# Patient Record
Sex: Male | Born: 1975 | Race: Black or African American | Hispanic: No | Marital: Single | Smoking: Never smoker
Health system: Southern US, Community
[De-identification: ages and names within clinical notes are randomized; demographics above are authoritative.]

## PROBLEM LIST (undated history)

## (undated) DIAGNOSIS — I10 Essential (primary) hypertension: Secondary | ICD-10-CM

## (undated) DIAGNOSIS — C9590 Leukemia, unspecified not having achieved remission: Secondary | ICD-10-CM

## (undated) DIAGNOSIS — S0300XA Dislocation of jaw, unspecified side, initial encounter: Secondary | ICD-10-CM

## (undated) DIAGNOSIS — F840 Autistic disorder: Secondary | ICD-10-CM

## (undated) DIAGNOSIS — L739 Follicular disorder, unspecified: Secondary | ICD-10-CM

## (undated) DIAGNOSIS — F639 Impulse disorder, unspecified: Secondary | ICD-10-CM

## (undated) HISTORY — PX: ANKLE SURGERY: SHX546

---

## 1997-10-05 ENCOUNTER — Ambulatory Visit (HOSPITAL_BASED_OUTPATIENT_CLINIC_OR_DEPARTMENT_OTHER): Admission: RE | Admit: 1997-10-05 | Discharge: 1997-10-05 | Payer: Self-pay | Admitting: Otolaryngology

## 1999-06-15 ENCOUNTER — Emergency Department (HOSPITAL_COMMUNITY): Admission: EM | Admit: 1999-06-15 | Discharge: 1999-06-15 | Payer: Self-pay | Admitting: Emergency Medicine

## 1999-06-23 ENCOUNTER — Emergency Department (HOSPITAL_COMMUNITY): Admission: EM | Admit: 1999-06-23 | Discharge: 1999-06-23 | Payer: Self-pay | Admitting: Emergency Medicine

## 1999-06-23 ENCOUNTER — Encounter: Payer: Self-pay | Admitting: Emergency Medicine

## 2000-02-29 ENCOUNTER — Emergency Department (HOSPITAL_COMMUNITY): Admission: EM | Admit: 2000-02-29 | Discharge: 2000-02-29 | Payer: Self-pay | Admitting: Emergency Medicine

## 2000-02-29 ENCOUNTER — Encounter: Payer: Self-pay | Admitting: Emergency Medicine

## 2000-03-02 ENCOUNTER — Ambulatory Visit (HOSPITAL_COMMUNITY): Admission: RE | Admit: 2000-03-02 | Discharge: 2000-03-02 | Payer: Self-pay | Admitting: Gastroenterology

## 2000-03-02 ENCOUNTER — Encounter (INDEPENDENT_AMBULATORY_CARE_PROVIDER_SITE_OTHER): Payer: Self-pay | Admitting: *Deleted

## 2000-07-01 ENCOUNTER — Emergency Department (HOSPITAL_COMMUNITY): Admission: EM | Admit: 2000-07-01 | Discharge: 2000-07-01 | Payer: Self-pay | Admitting: Emergency Medicine

## 2000-07-01 ENCOUNTER — Encounter: Payer: Self-pay | Admitting: Emergency Medicine

## 2001-07-31 ENCOUNTER — Emergency Department (HOSPITAL_COMMUNITY): Admission: EM | Admit: 2001-07-31 | Discharge: 2001-07-31 | Payer: Self-pay | Admitting: Emergency Medicine

## 2001-08-07 ENCOUNTER — Emergency Department (HOSPITAL_COMMUNITY): Admission: EM | Admit: 2001-08-07 | Discharge: 2001-08-07 | Payer: Self-pay | Admitting: Emergency Medicine

## 2002-08-18 ENCOUNTER — Encounter: Payer: Self-pay | Admitting: Emergency Medicine

## 2002-08-18 ENCOUNTER — Emergency Department (HOSPITAL_COMMUNITY): Admission: EM | Admit: 2002-08-18 | Discharge: 2002-08-18 | Payer: Self-pay | Admitting: Emergency Medicine

## 2002-09-08 ENCOUNTER — Emergency Department (HOSPITAL_COMMUNITY): Admission: EM | Admit: 2002-09-08 | Discharge: 2002-09-08 | Payer: Self-pay | Admitting: Emergency Medicine

## 2003-03-03 ENCOUNTER — Emergency Department (HOSPITAL_COMMUNITY): Admission: EM | Admit: 2003-03-03 | Discharge: 2003-03-04 | Payer: Self-pay | Admitting: Emergency Medicine

## 2003-12-04 ENCOUNTER — Ambulatory Visit (HOSPITAL_COMMUNITY): Admission: RE | Admit: 2003-12-04 | Discharge: 2003-12-04 | Payer: Self-pay | Admitting: Family Medicine

## 2004-05-07 ENCOUNTER — Emergency Department (HOSPITAL_COMMUNITY): Admission: EM | Admit: 2004-05-07 | Discharge: 2004-05-07 | Payer: Self-pay | Admitting: Emergency Medicine

## 2004-05-12 ENCOUNTER — Emergency Department (HOSPITAL_COMMUNITY): Admission: EM | Admit: 2004-05-12 | Discharge: 2004-05-12 | Payer: Self-pay | Admitting: Emergency Medicine

## 2005-07-12 ENCOUNTER — Ambulatory Visit: Payer: Self-pay | Admitting: Oncology

## 2005-07-18 ENCOUNTER — Encounter: Payer: Self-pay | Admitting: Oncology

## 2005-07-18 ENCOUNTER — Other Ambulatory Visit: Admission: RE | Admit: 2005-07-18 | Discharge: 2005-07-18 | Payer: Self-pay | Admitting: Oncology

## 2005-07-18 LAB — COMPREHENSIVE METABOLIC PANEL
AST: 40 U/L — ABNORMAL HIGH (ref 0–37)
Albumin: 4.5 g/dL (ref 3.5–5.2)
Alkaline Phosphatase: 73 U/L (ref 39–117)
BUN: 12 mg/dL (ref 6–23)
Creatinine, Ser: 1 mg/dL (ref 0.4–1.5)
Glucose, Bld: 109 mg/dL — ABNORMAL HIGH (ref 70–99)

## 2005-07-18 LAB — MANUAL DIFFERENTIAL
ANC (CHCC manual diff): 119 10*3/uL — ABNORMAL HIGH (ref 1.5–6.5)
Blasts: 12 % — ABNORMAL HIGH (ref 0–0)
EOS: 3 % (ref 0–7)
Metamyelocytes: 15 % — ABNORMAL HIGH (ref 0–0)
Myelocytes: 27 % — ABNORMAL HIGH (ref 0–0)
nRBC: 0 % (ref 0–0)

## 2005-07-18 LAB — CBC WITH DIFFERENTIAL/PLATELET
HCT: 36.9 % — ABNORMAL LOW (ref 38.7–49.9)
MCH: 30.6 pg (ref 28.0–33.4)
MCHC: 33.3 g/dL (ref 32.0–35.9)
RBC: 4.01 10*6/uL — ABNORMAL LOW (ref 4.20–5.71)
RDW: 16.6 % — ABNORMAL HIGH (ref 11.2–14.6)

## 2005-07-18 LAB — CHCC SMEAR

## 2005-07-19 ENCOUNTER — Ambulatory Visit: Payer: Self-pay | Admitting: Oncology

## 2005-07-19 ENCOUNTER — Inpatient Hospital Stay (HOSPITAL_COMMUNITY): Admission: EM | Admit: 2005-07-19 | Discharge: 2005-07-25 | Payer: Self-pay | Admitting: Oncology

## 2005-08-10 LAB — CBC WITH DIFFERENTIAL/PLATELET
Basophils Absolute: 0 10*3/uL (ref 0.0–0.1)
Eosinophils Absolute: 0 10*3/uL (ref 0.0–0.5)
HCT: 35.4 % — ABNORMAL LOW (ref 38.7–49.9)
HGB: 12.2 g/dL — ABNORMAL LOW (ref 13.0–17.1)
MCH: 31.9 pg (ref 28.0–33.4)
MONO#: 0.3 10*3/uL (ref 0.1–0.9)
NEUT#: 3.2 10*3/uL (ref 1.5–6.5)
NEUT%: 71.7 % (ref 40.0–75.0)
RDW: 18 % — ABNORMAL HIGH (ref 11.2–14.6)
WBC: 4.4 10*3/uL (ref 4.0–10.0)
lymph#: 0.9 10*3/uL (ref 0.9–3.3)

## 2005-08-21 LAB — COMPREHENSIVE METABOLIC PANEL
AST: 21 U/L (ref 0–37)
Albumin: 4.9 g/dL (ref 3.5–5.2)
BUN: 10 mg/dL (ref 6–23)
CO2: 29 mEq/L (ref 19–32)
Calcium: 9.4 mg/dL (ref 8.4–10.5)
Chloride: 96 mEq/L (ref 96–112)
Creatinine, Ser: 0.97 mg/dL (ref 0.40–1.50)
Potassium: 3.7 mEq/L (ref 3.5–5.3)

## 2005-08-21 LAB — LACTATE DEHYDROGENASE: LDH: 205 U/L (ref 94–250)

## 2005-08-23 LAB — BCR/ABL GENE REARRANGEMENT QNT, PCR: Major BCR/ABL(b2/a2 or b3/a2): 0.8058 Ratio

## 2005-08-29 ENCOUNTER — Ambulatory Visit: Payer: Self-pay | Admitting: Oncology

## 2005-08-29 LAB — CBC WITH DIFFERENTIAL/PLATELET
Basophils Absolute: 0 10*3/uL (ref 0.0–0.1)
Eosinophils Absolute: 0 10*3/uL (ref 0.0–0.5)
HCT: 37 % — ABNORMAL LOW (ref 38.7–49.9)
HGB: 12.5 g/dL — ABNORMAL LOW (ref 13.0–17.1)
LYMPH%: 24.9 % (ref 14.0–48.0)
MCV: 93.2 fL (ref 81.6–98.0)
MONO#: 0.7 10*3/uL (ref 0.1–0.9)
MONO%: 16.5 % — ABNORMAL HIGH (ref 0.0–13.0)
NEUT#: 2.4 10*3/uL (ref 1.5–6.5)
Platelets: 167 10*3/uL (ref 145–400)
RBC: 3.97 10*6/uL — ABNORMAL LOW (ref 4.20–5.71)
WBC: 4.1 10*3/uL (ref 4.0–10.0)

## 2005-09-08 LAB — CBC WITH DIFFERENTIAL/PLATELET
BASO%: 0.2 % (ref 0.0–2.0)
Eosinophils Absolute: 0 10*3/uL (ref 0.0–0.5)
HCT: 38.2 % — ABNORMAL LOW (ref 38.7–49.9)
LYMPH%: 23.5 % (ref 14.0–48.0)
MCHC: 33.5 g/dL (ref 32.0–35.9)
MCV: 93.2 fL (ref 81.6–98.0)
MONO#: 0.6 10*3/uL (ref 0.1–0.9)
MONO%: 13.1 % — ABNORMAL HIGH (ref 0.0–13.0)
NEUT%: 62.7 % (ref 40.0–75.0)
Platelets: 174 10*3/uL (ref 145–400)
RBC: 4.1 10*6/uL — ABNORMAL LOW (ref 4.20–5.71)
WBC: 4.4 10*3/uL (ref 4.0–10.0)

## 2005-09-08 LAB — LACTATE DEHYDROGENASE: LDH: 194 U/L (ref 94–250)

## 2005-09-08 LAB — COMPREHENSIVE METABOLIC PANEL
Alkaline Phosphatase: 522 U/L — ABNORMAL HIGH (ref 39–117)
CO2: 25 mEq/L (ref 19–32)
Creatinine, Ser: 1.01 mg/dL (ref 0.40–1.50)
Glucose, Bld: 81 mg/dL (ref 70–99)
Sodium: 130 mEq/L — ABNORMAL LOW (ref 135–145)
Total Bilirubin: 0.4 mg/dL (ref 0.3–1.2)

## 2005-10-12 ENCOUNTER — Ambulatory Visit: Payer: Self-pay | Admitting: Oncology

## 2005-10-12 LAB — CBC WITH DIFFERENTIAL/PLATELET
Basophils Absolute: 0 10*3/uL (ref 0.0–0.1)
Eosinophils Absolute: 0.1 10*3/uL (ref 0.0–0.5)
HGB: 11.9 g/dL — ABNORMAL LOW (ref 13.0–17.1)
LYMPH%: 25.8 % (ref 14.0–48.0)
MONO#: 0.5 10*3/uL (ref 0.1–0.9)
NEUT#: 2.6 10*3/uL (ref 1.5–6.5)
Platelets: 169 10*3/uL (ref 145–400)
RBC: 3.84 10*6/uL — ABNORMAL LOW (ref 4.20–5.71)
WBC: 4.4 10*3/uL (ref 4.0–10.0)

## 2005-10-17 LAB — COMPREHENSIVE METABOLIC PANEL
Albumin: 4 g/dL (ref 3.5–5.2)
CO2: 29 mEq/L (ref 19–32)
Glucose, Bld: 90 mg/dL (ref 70–99)
Potassium: 3.7 mEq/L (ref 3.5–5.3)
Sodium: 131 mEq/L — ABNORMAL LOW (ref 135–145)
Total Bilirubin: 0.4 mg/dL (ref 0.3–1.2)
Total Protein: 6.5 g/dL (ref 6.0–8.3)

## 2005-10-17 LAB — LACTATE DEHYDROGENASE: LDH: 185 U/L (ref 94–250)

## 2005-10-17 LAB — BCR/ABL QUALITATIVE: BCR/ABL t(9,22): POSITIVE — AB

## 2005-10-17 LAB — BCR/ABL GENE REARRANGEMENT QNT, PCR

## 2005-11-15 LAB — COMPREHENSIVE METABOLIC PANEL
Alkaline Phosphatase: 135 U/L — ABNORMAL HIGH (ref 39–117)
BUN: 10 mg/dL (ref 6–23)
CO2: 27 mEq/L (ref 19–32)
Creatinine, Ser: 0.98 mg/dL (ref 0.40–1.50)
Glucose, Bld: 89 mg/dL (ref 70–99)
Sodium: 133 mEq/L — ABNORMAL LOW (ref 135–145)
Total Bilirubin: 0.6 mg/dL (ref 0.3–1.2)
Total Protein: 6.4 g/dL (ref 6.0–8.3)

## 2005-11-15 LAB — CBC WITH DIFFERENTIAL/PLATELET
Basophils Absolute: 0 10*3/uL (ref 0.0–0.1)
EOS%: 1.7 % (ref 0.0–7.0)
HGB: 12.4 g/dL — ABNORMAL LOW (ref 13.0–17.1)
MCH: 30.7 pg (ref 28.0–33.4)
MCHC: 33.8 g/dL (ref 32.0–35.9)
RDW: 16.5 % — ABNORMAL HIGH (ref 11.2–14.6)
WBC: 3.7 10*3/uL — ABNORMAL LOW (ref 4.0–10.0)
lymph#: 0.9 10*3/uL (ref 0.9–3.3)

## 2005-11-19 ENCOUNTER — Inpatient Hospital Stay (HOSPITAL_COMMUNITY): Admission: EM | Admit: 2005-11-19 | Discharge: 2005-11-21 | Payer: Self-pay | Admitting: Emergency Medicine

## 2005-11-23 ENCOUNTER — Emergency Department (HOSPITAL_COMMUNITY): Admission: EM | Admit: 2005-11-23 | Discharge: 2005-11-23 | Payer: Self-pay | Admitting: Emergency Medicine

## 2005-12-13 ENCOUNTER — Ambulatory Visit: Payer: Self-pay | Admitting: Oncology

## 2005-12-18 ENCOUNTER — Encounter: Admission: RE | Admit: 2005-12-18 | Discharge: 2005-12-18 | Payer: Self-pay | Admitting: Orthopedic Surgery

## 2006-01-25 LAB — CBC WITH DIFFERENTIAL/PLATELET
Basophils Absolute: 0 10*3/uL (ref 0.0–0.1)
HCT: 40.3 % (ref 38.7–49.9)
HGB: 13.7 g/dL (ref 13.0–17.1)
LYMPH%: 31.2 % (ref 14.0–48.0)
MCH: 31.3 pg (ref 28.0–33.4)
MONO#: 0.3 10*3/uL (ref 0.1–0.9)
NEUT%: 59.3 % (ref 40.0–75.0)
Platelets: 165 10*3/uL (ref 145–400)
WBC: 3.3 10*3/uL — ABNORMAL LOW (ref 4.0–10.0)
lymph#: 1 10*3/uL (ref 0.9–3.3)

## 2006-01-25 LAB — COMPREHENSIVE METABOLIC PANEL
BUN: 10 mg/dL (ref 6–23)
CO2: 29 mEq/L (ref 19–32)
Calcium: 9.5 mg/dL (ref 8.4–10.5)
Chloride: 92 mEq/L — ABNORMAL LOW (ref 96–112)
Creatinine, Ser: 0.96 mg/dL (ref 0.40–1.50)

## 2006-02-24 ENCOUNTER — Ambulatory Visit: Payer: Self-pay | Admitting: Oncology

## 2006-03-01 LAB — CBC WITH DIFFERENTIAL/PLATELET
BASO%: 0.3 % (ref 0.0–2.0)
EOS%: 3.1 % (ref 0.0–7.0)
LYMPH%: 33.7 % (ref 14.0–48.0)
MCHC: 33.9 g/dL (ref 32.0–35.9)
MCV: 92 fL (ref 81.6–98.0)
MONO%: 8.1 % (ref 0.0–13.0)
Platelets: 177 10*3/uL (ref 145–400)
RBC: 4.23 10*6/uL (ref 4.20–5.71)
RDW: 14.8 % — ABNORMAL HIGH (ref 11.2–14.6)
WBC: 3.6 10*3/uL — ABNORMAL LOW (ref 4.0–10.0)

## 2006-03-12 LAB — COMPREHENSIVE METABOLIC PANEL
ALT: 39 U/L (ref 0–53)
AST: 35 U/L (ref 0–37)
Alkaline Phosphatase: 191 U/L — ABNORMAL HIGH (ref 39–117)
Potassium: 3.7 mEq/L (ref 3.5–5.3)
Sodium: 139 mEq/L (ref 135–145)
Total Bilirubin: 0.4 mg/dL (ref 0.3–1.2)
Total Protein: 7.3 g/dL (ref 6.0–8.3)

## 2006-03-12 LAB — BCR/ABL GENE REARRANGEMENT QNT, PCR
BCR/ABL Previous Quant 1: POSITIVE — AB
BCR/ABL Previous Quant Ratio: 0.1015

## 2006-04-04 LAB — CBC WITH DIFFERENTIAL/PLATELET
BASO%: 0.3 % (ref 0.0–2.0)
LYMPH%: 38.3 % (ref 14.0–48.0)
MCHC: 34.7 g/dL (ref 32.0–35.9)
MONO#: 0.3 10*3/uL (ref 0.1–0.9)
Platelets: 145 10*3/uL (ref 145–400)
RBC: 4.03 10*6/uL — ABNORMAL LOW (ref 4.20–5.71)
RDW: 16.1 % — ABNORMAL HIGH (ref 11.2–14.6)
WBC: 3.8 10*3/uL — ABNORMAL LOW (ref 4.0–10.0)

## 2006-04-04 LAB — COMPREHENSIVE METABOLIC PANEL
ALT: 33 U/L (ref 0–53)
CO2: 25 mEq/L (ref 19–32)
Calcium: 9.1 mg/dL (ref 8.4–10.5)
Chloride: 99 mEq/L (ref 96–112)
Potassium: 3.7 mEq/L (ref 3.5–5.3)
Sodium: 138 mEq/L (ref 135–145)
Total Protein: 6.9 g/dL (ref 6.0–8.3)

## 2006-04-30 ENCOUNTER — Ambulatory Visit: Payer: Self-pay | Admitting: Oncology

## 2006-05-03 LAB — CBC WITH DIFFERENTIAL/PLATELET
BASO%: 0.2 % (ref 0.0–2.0)
EOS%: 1.1 % (ref 0.0–7.0)
LYMPH%: 33.9 % (ref 14.0–48.0)
MCH: 31.9 pg (ref 28.0–33.4)
MCHC: 35.1 g/dL (ref 32.0–35.9)
MONO#: 0.3 10*3/uL (ref 0.1–0.9)
RBC: 3.81 10*6/uL — ABNORMAL LOW (ref 4.20–5.71)
WBC: 2.9 10*3/uL — ABNORMAL LOW (ref 4.0–10.0)
lymph#: 1 10*3/uL (ref 0.9–3.3)

## 2006-05-03 LAB — COMPREHENSIVE METABOLIC PANEL
ALT: 42 U/L (ref 0–53)
AST: 40 U/L — ABNORMAL HIGH (ref 0–37)
CO2: 26 mEq/L (ref 19–32)
Chloride: 98 mEq/L (ref 96–112)
Sodium: 135 mEq/L (ref 135–145)
Total Bilirubin: 0.5 mg/dL (ref 0.3–1.2)
Total Protein: 7.2 g/dL (ref 6.0–8.3)

## 2006-05-03 LAB — LACTATE DEHYDROGENASE: LDH: 180 U/L (ref 94–250)

## 2006-06-07 LAB — CBC WITH DIFFERENTIAL/PLATELET
BASO%: 0.4 % (ref 0.0–2.0)
Basophils Absolute: 0 10*3/uL (ref 0.0–0.1)
EOS%: 1.6 % (ref 0.0–7.0)
HCT: 36.8 % — ABNORMAL LOW (ref 38.7–49.9)
HGB: 12.8 g/dL — ABNORMAL LOW (ref 13.0–17.1)
LYMPH%: 40.4 % (ref 14.0–48.0)
MCH: 33 pg (ref 28.0–33.4)
MCHC: 34.9 g/dL (ref 32.0–35.9)
MCV: 94.5 fL (ref 81.6–98.0)
MONO%: 12.3 % (ref 0.0–13.0)
NEUT%: 45.3 % (ref 40.0–75.0)
lymph#: 1.2 10*3/uL (ref 0.9–3.3)

## 2006-06-12 LAB — BCR/ABL GENE REARRANGEMENT QNT, PCR: BCR/ABL Previous Quant Ratio: 0.0346

## 2006-06-12 LAB — COMPREHENSIVE METABOLIC PANEL
ALT: 35 U/L (ref 0–53)
CO2: 27 mEq/L (ref 19–32)
Calcium: 9.3 mg/dL (ref 8.4–10.5)
Chloride: 98 mEq/L (ref 96–112)
Sodium: 133 mEq/L — ABNORMAL LOW (ref 135–145)
Total Protein: 6.7 g/dL (ref 6.0–8.3)

## 2006-06-12 LAB — LACTATE DEHYDROGENASE: LDH: 170 U/L (ref 94–250)

## 2006-07-04 ENCOUNTER — Ambulatory Visit: Payer: Self-pay | Admitting: Oncology

## 2006-07-06 LAB — COMPREHENSIVE METABOLIC PANEL
ALT: 32 U/L (ref 0–53)
Albumin: 4.3 g/dL (ref 3.5–5.2)
BUN: 10 mg/dL (ref 6–23)
CO2: 28 mEq/L (ref 19–32)
Calcium: 9.3 mg/dL (ref 8.4–10.5)
Chloride: 98 mEq/L (ref 96–112)
Creatinine, Ser: 1 mg/dL (ref 0.40–1.50)

## 2006-07-06 LAB — CBC WITH DIFFERENTIAL/PLATELET
BASO%: 0.1 % (ref 0.0–2.0)
Basophils Absolute: 0 10*3/uL (ref 0.0–0.1)
HCT: 36.3 % — ABNORMAL LOW (ref 38.7–49.9)
HGB: 12.6 g/dL — ABNORMAL LOW (ref 13.0–17.1)
MONO#: 0.5 10*3/uL (ref 0.1–0.9)
NEUT%: 32.8 % — ABNORMAL LOW (ref 40.0–75.0)
WBC: 3.7 10*3/uL — ABNORMAL LOW (ref 4.0–10.0)
lymph#: 1.9 10*3/uL (ref 0.9–3.3)

## 2006-07-06 LAB — LACTATE DEHYDROGENASE: LDH: 183 U/L (ref 94–250)

## 2006-08-10 LAB — CBC WITH DIFFERENTIAL/PLATELET
Basophils Absolute: 0 10*3/uL (ref 0.0–0.1)
EOS%: 1.4 % (ref 0.0–7.0)
HCT: 35.7 % — ABNORMAL LOW (ref 38.7–49.9)
HGB: 12.7 g/dL — ABNORMAL LOW (ref 13.0–17.1)
MCH: 33.1 pg (ref 28.0–33.4)
MCV: 93.3 fL (ref 81.6–98.0)
MONO%: 11.4 % (ref 0.0–13.0)
NEUT%: 45.8 % (ref 40.0–75.0)

## 2006-08-10 LAB — COMPREHENSIVE METABOLIC PANEL
AST: 43 U/L — ABNORMAL HIGH (ref 0–37)
Alkaline Phosphatase: 151 U/L — ABNORMAL HIGH (ref 39–117)
BUN: 13 mg/dL (ref 6–23)
Calcium: 9.1 mg/dL (ref 8.4–10.5)
Chloride: 99 mEq/L (ref 96–112)
Creatinine, Ser: 1.09 mg/dL (ref 0.40–1.50)

## 2006-09-26 ENCOUNTER — Ambulatory Visit: Payer: Self-pay | Admitting: Oncology

## 2006-09-28 LAB — COMPREHENSIVE METABOLIC PANEL
Albumin: 4 g/dL (ref 3.5–5.2)
Alkaline Phosphatase: 131 U/L — ABNORMAL HIGH (ref 39–117)
BUN: 11 mg/dL (ref 6–23)
CO2: 26 mEq/L (ref 19–32)
Calcium: 8.8 mg/dL (ref 8.4–10.5)
Chloride: 99 mEq/L (ref 96–112)
Glucose, Bld: 69 mg/dL — ABNORMAL LOW (ref 70–99)
Potassium: 4 mEq/L (ref 3.5–5.3)

## 2006-09-28 LAB — CBC WITH DIFFERENTIAL/PLATELET
Basophils Absolute: 0 10*3/uL (ref 0.0–0.1)
Eosinophils Absolute: 0 10*3/uL (ref 0.0–0.5)
HGB: 11.9 g/dL — ABNORMAL LOW (ref 13.0–17.1)
MCV: 94 fL (ref 81.6–98.0)
MONO#: 0.3 10*3/uL (ref 0.1–0.9)
MONO%: 10.6 % (ref 0.0–13.0)
NEUT#: 1.3 10*3/uL — ABNORMAL LOW (ref 1.5–6.5)
RDW: 14.8 % — ABNORMAL HIGH (ref 11.2–14.6)

## 2006-11-15 ENCOUNTER — Ambulatory Visit: Payer: Self-pay | Admitting: Oncology

## 2006-11-20 LAB — COMPREHENSIVE METABOLIC PANEL
ALT: 34 U/L (ref 0–53)
CO2: 28 mEq/L (ref 19–32)
Calcium: 9.2 mg/dL (ref 8.4–10.5)
Chloride: 102 mEq/L (ref 96–112)
Sodium: 138 mEq/L (ref 135–145)
Total Protein: 6.7 g/dL (ref 6.0–8.3)

## 2006-11-20 LAB — CBC WITH DIFFERENTIAL/PLATELET
BASO%: 1.4 % (ref 0.0–2.0)
MCHC: 34.7 g/dL (ref 32.0–35.9)
MONO#: 0.4 10*3/uL (ref 0.1–0.9)
RBC: 3.85 10*6/uL — ABNORMAL LOW (ref 4.20–5.71)
RDW: 12.2 % (ref 11.2–14.6)
WBC: 2.9 10*3/uL — ABNORMAL LOW (ref 4.0–10.0)
lymph#: 1.3 10*3/uL (ref 0.9–3.3)

## 2006-11-20 LAB — LACTATE DEHYDROGENASE: LDH: 172 U/L (ref 94–250)

## 2006-12-17 ENCOUNTER — Emergency Department (HOSPITAL_COMMUNITY): Admission: EM | Admit: 2006-12-17 | Discharge: 2006-12-17 | Payer: Self-pay | Admitting: *Deleted

## 2007-01-15 ENCOUNTER — Ambulatory Visit: Payer: Self-pay | Admitting: Oncology

## 2007-01-17 LAB — CBC WITH DIFFERENTIAL/PLATELET
Basophils Absolute: 0 10*3/uL (ref 0.0–0.1)
EOS%: 3.3 % (ref 0.0–7.0)
HGB: 11.5 g/dL — ABNORMAL LOW (ref 13.0–17.1)
LYMPH%: 38.8 % (ref 14.0–48.0)
MCH: 33.2 pg (ref 28.0–33.4)
MCV: 94.8 fL (ref 81.6–98.0)
MONO%: 7.9 % (ref 0.0–13.0)
Platelets: 149 10*3/uL (ref 145–400)
RBC: 3.45 10*6/uL — ABNORMAL LOW (ref 4.20–5.71)
RDW: 14.3 % (ref 11.2–14.6)

## 2007-01-17 LAB — COMPREHENSIVE METABOLIC PANEL
AST: 58 U/L — ABNORMAL HIGH (ref 0–37)
Albumin: 4.1 g/dL (ref 3.5–5.2)
Alkaline Phosphatase: 90 U/L (ref 39–117)
BUN: 18 mg/dL (ref 6–23)
Potassium: 3.9 mEq/L (ref 3.5–5.3)
Total Bilirubin: 0.4 mg/dL (ref 0.3–1.2)

## 2007-01-17 LAB — LACTATE DEHYDROGENASE: LDH: 188 U/L (ref 94–250)

## 2007-03-18 ENCOUNTER — Ambulatory Visit: Payer: Self-pay | Admitting: Oncology

## 2007-03-20 LAB — COMPREHENSIVE METABOLIC PANEL
ALT: 32 U/L (ref 0–53)
AST: 39 U/L — ABNORMAL HIGH (ref 0–37)
Calcium: 8.9 mg/dL (ref 8.4–10.5)
Chloride: 100 mEq/L (ref 96–112)
Creatinine, Ser: 1.15 mg/dL (ref 0.40–1.50)
Sodium: 136 mEq/L (ref 135–145)
Total Protein: 6.8 g/dL (ref 6.0–8.3)

## 2007-03-20 LAB — CBC WITH DIFFERENTIAL/PLATELET
BASO%: 0.1 % (ref 0.0–2.0)
EOS%: 1.2 % (ref 0.0–7.0)
MCH: 32.7 pg (ref 28.0–33.4)
MCHC: 34.1 g/dL (ref 32.0–35.9)
NEUT%: 53.5 % (ref 40.0–75.0)
RBC: 3.72 10*6/uL — ABNORMAL LOW (ref 4.20–5.71)
RDW: 14.2 % (ref 11.2–14.6)
WBC: 3.2 10*3/uL — ABNORMAL LOW (ref 4.0–10.0)
lymph#: 1.2 10*3/uL (ref 0.9–3.3)

## 2007-05-13 ENCOUNTER — Ambulatory Visit: Payer: Self-pay | Admitting: Oncology

## 2007-05-15 LAB — COMPREHENSIVE METABOLIC PANEL
AST: 42 U/L — ABNORMAL HIGH (ref 0–37)
Alkaline Phosphatase: 87 U/L (ref 39–117)
Glucose, Bld: 88 mg/dL (ref 70–99)
Potassium: 3.9 mEq/L (ref 3.5–5.3)
Sodium: 137 mEq/L (ref 135–145)
Total Bilirubin: 0.4 mg/dL (ref 0.3–1.2)
Total Protein: 6.8 g/dL (ref 6.0–8.3)

## 2007-05-15 LAB — CBC WITH DIFFERENTIAL/PLATELET
BASO%: 0.1 % (ref 0.0–2.0)
EOS%: 4 % (ref 0.0–7.0)
MCH: 33 pg (ref 28.0–33.4)
MCHC: 34.8 g/dL (ref 32.0–35.9)
MONO#: 0.3 10*3/uL (ref 0.1–0.9)
NEUT%: 46.4 % (ref 40.0–75.0)
RBC: 3.6 10*6/uL — ABNORMAL LOW (ref 4.20–5.71)
RDW: 14.2 % (ref 11.2–14.6)
WBC: 3.2 10*3/uL — ABNORMAL LOW (ref 4.0–10.0)
lymph#: 1.3 10*3/uL (ref 0.9–3.3)

## 2007-05-22 LAB — BCR/ABL

## 2007-07-12 ENCOUNTER — Ambulatory Visit: Payer: Self-pay | Admitting: Oncology

## 2007-07-19 ENCOUNTER — Emergency Department (HOSPITAL_COMMUNITY): Admission: EM | Admit: 2007-07-19 | Discharge: 2007-07-19 | Payer: Self-pay | Admitting: Emergency Medicine

## 2007-09-10 ENCOUNTER — Encounter: Admission: RE | Admit: 2007-09-10 | Discharge: 2007-09-10 | Payer: Self-pay | Admitting: Family Medicine

## 2007-10-13 ENCOUNTER — Ambulatory Visit: Payer: Self-pay | Admitting: Oncology

## 2007-10-17 LAB — CBC WITH DIFFERENTIAL/PLATELET
BASO%: 0.4 % (ref 0.0–2.0)
Eosinophils Absolute: 0.1 10*3/uL (ref 0.0–0.5)
HCT: 34.6 % — ABNORMAL LOW (ref 38.7–49.9)
LYMPH%: 39.3 % (ref 14.0–48.0)
MCHC: 34.1 g/dL (ref 32.0–35.9)
MONO#: 0.3 10*3/uL (ref 0.1–0.9)
NEUT#: 1.4 10*3/uL — ABNORMAL LOW (ref 1.5–6.5)
Platelets: 116 10*3/uL — ABNORMAL LOW (ref 145–400)
RBC: 3.54 10*6/uL — ABNORMAL LOW (ref 4.20–5.71)
WBC: 3 10*3/uL — ABNORMAL LOW (ref 4.0–10.0)
lymph#: 1.2 10*3/uL (ref 0.9–3.3)

## 2007-10-17 LAB — COMPREHENSIVE METABOLIC PANEL
ALT: 42 U/L (ref 0–53)
Albumin: 4 g/dL (ref 3.5–5.2)
CO2: 26 mEq/L (ref 19–32)
Calcium: 9 mg/dL (ref 8.4–10.5)
Chloride: 94 mEq/L — ABNORMAL LOW (ref 96–112)
Glucose, Bld: 111 mg/dL — ABNORMAL HIGH (ref 70–99)
Potassium: 4 mEq/L (ref 3.5–5.3)
Sodium: 131 mEq/L — ABNORMAL LOW (ref 135–145)
Total Bilirubin: 0.5 mg/dL (ref 0.3–1.2)
Total Protein: 6.7 g/dL (ref 6.0–8.3)

## 2007-10-17 LAB — LACTATE DEHYDROGENASE: LDH: 218 U/L (ref 94–250)

## 2007-11-28 ENCOUNTER — Ambulatory Visit: Payer: Self-pay | Admitting: Oncology

## 2007-11-28 LAB — CBC WITH DIFFERENTIAL/PLATELET
Basophils Absolute: 0 10*3/uL (ref 0.0–0.1)
EOS%: 3 % (ref 0.0–7.0)
HGB: 11.1 g/dL — ABNORMAL LOW (ref 13.0–17.1)
LYMPH%: 34.1 % (ref 14.0–48.0)
MCH: 32.6 pg (ref 28.0–33.4)
MCV: 96.7 fL (ref 81.6–98.0)
MONO%: 11.5 % (ref 0.0–13.0)
NEUT%: 50.2 % (ref 40.0–75.0)
Platelets: 130 10*3/uL — ABNORMAL LOW (ref 145–400)
RDW: 13.4 % (ref 11.2–14.6)

## 2007-11-28 LAB — COMPREHENSIVE METABOLIC PANEL
AST: 46 U/L — ABNORMAL HIGH (ref 0–37)
Alkaline Phosphatase: 80 U/L (ref 39–117)
BUN: 18 mg/dL (ref 6–23)
Creatinine, Ser: 1.35 mg/dL (ref 0.40–1.50)
Potassium: 4.1 mEq/L (ref 3.5–5.3)
Total Bilirubin: 0.5 mg/dL (ref 0.3–1.2)

## 2007-12-04 LAB — BCR/ABL

## 2008-02-03 ENCOUNTER — Ambulatory Visit: Payer: Self-pay | Admitting: Oncology

## 2008-02-05 LAB — CBC WITH DIFFERENTIAL/PLATELET
Eosinophils Absolute: 0.1 10*3/uL (ref 0.0–0.5)
HCT: 31.7 % — ABNORMAL LOW (ref 38.7–49.9)
HGB: 11 g/dL — ABNORMAL LOW (ref 13.0–17.1)
LYMPH%: 36.9 % (ref 14.0–48.0)
MONO#: 0.4 10*3/uL (ref 0.1–0.9)
NEUT#: 1.6 10*3/uL (ref 1.5–6.5)
NEUT%: 47.5 % (ref 40.0–75.0)
Platelets: 140 10*3/uL — ABNORMAL LOW (ref 145–400)
WBC: 3.3 10*3/uL — ABNORMAL LOW (ref 4.0–10.0)

## 2008-02-05 LAB — COMPREHENSIVE METABOLIC PANEL
ALT: 47 U/L (ref 0–53)
AST: 53 U/L — ABNORMAL HIGH (ref 0–37)
CO2: 26 mEq/L (ref 19–32)
Chloride: 98 mEq/L (ref 96–112)
Sodium: 136 mEq/L (ref 135–145)
Total Bilirubin: 0.6 mg/dL (ref 0.3–1.2)
Total Protein: 7.4 g/dL (ref 6.0–8.3)

## 2008-02-05 LAB — LACTATE DEHYDROGENASE: LDH: 276 U/L — ABNORMAL HIGH (ref 94–250)

## 2008-03-06 ENCOUNTER — Encounter: Payer: Self-pay | Admitting: Gastroenterology

## 2008-03-10 LAB — URINALYSIS, MICROSCOPIC - CHCC
Bilirubin (Urine): NEGATIVE
Blood: NEGATIVE
Ketones: NEGATIVE mg/dL
pH: 7 (ref 4.6–8.0)

## 2008-05-05 ENCOUNTER — Ambulatory Visit: Payer: Self-pay | Admitting: Oncology

## 2008-05-12 LAB — CBC WITH DIFFERENTIAL/PLATELET
BASO%: 0.2 % (ref 0.0–2.0)
EOS%: 2.1 % (ref 0.0–7.0)
HCT: 36.8 % — ABNORMAL LOW (ref 38.4–49.9)
MCH: 32.5 pg (ref 27.2–33.4)
MCHC: 34.1 g/dL (ref 32.0–36.0)
NEUT%: 49.5 % (ref 39.0–75.0)
RBC: 3.86 10*6/uL — ABNORMAL LOW (ref 4.20–5.82)
lymph#: 1.3 10*3/uL (ref 0.9–3.3)

## 2008-05-12 LAB — COMPREHENSIVE METABOLIC PANEL
ALT: 48 U/L (ref 0–53)
AST: 53 U/L — ABNORMAL HIGH (ref 0–37)
Calcium: 9.4 mg/dL (ref 8.4–10.5)
Chloride: 104 mEq/L (ref 96–112)
Creatinine, Ser: 1.09 mg/dL (ref 0.40–1.50)
Total Bilirubin: 0.5 mg/dL (ref 0.3–1.2)

## 2008-05-19 ENCOUNTER — Encounter: Admission: RE | Admit: 2008-05-19 | Discharge: 2008-05-19 | Payer: Self-pay | Admitting: Family Medicine

## 2008-06-18 ENCOUNTER — Ambulatory Visit: Payer: Self-pay | Admitting: Gastroenterology

## 2008-06-18 DIAGNOSIS — K922 Gastrointestinal hemorrhage, unspecified: Secondary | ICD-10-CM | POA: Insufficient documentation

## 2008-07-01 ENCOUNTER — Telehealth (INDEPENDENT_AMBULATORY_CARE_PROVIDER_SITE_OTHER): Payer: Self-pay

## 2008-07-23 ENCOUNTER — Telehealth: Payer: Self-pay | Admitting: Gastroenterology

## 2008-07-29 ENCOUNTER — Ambulatory Visit (HOSPITAL_COMMUNITY): Admission: RE | Admit: 2008-07-29 | Discharge: 2008-07-29 | Payer: Self-pay | Admitting: Gastroenterology

## 2008-07-29 ENCOUNTER — Ambulatory Visit: Payer: Self-pay | Admitting: Gastroenterology

## 2008-08-11 ENCOUNTER — Ambulatory Visit: Payer: Self-pay | Admitting: Oncology

## 2008-08-13 LAB — CBC WITH DIFFERENTIAL/PLATELET
BASO%: 0.1 % (ref 0.0–2.0)
Basophils Absolute: 0 10*3/uL (ref 0.0–0.1)
Eosinophils Absolute: 0.1 10*3/uL (ref 0.0–0.5)
HCT: 31.9 % — ABNORMAL LOW (ref 38.4–49.9)
HGB: 11.3 g/dL — ABNORMAL LOW (ref 13.0–17.1)
LYMPH%: 41.6 % (ref 14.0–49.0)
MCHC: 35.5 g/dL (ref 32.0–36.0)
MONO#: 0.5 10*3/uL (ref 0.1–0.9)
NEUT#: 1.4 10*3/uL — ABNORMAL LOW (ref 1.5–6.5)
NEUT%: 42.8 % (ref 39.0–75.0)
Platelets: 135 10*3/uL — ABNORMAL LOW (ref 140–400)
WBC: 3.3 10*3/uL — ABNORMAL LOW (ref 4.0–10.3)
lymph#: 1.4 10*3/uL (ref 0.9–3.3)

## 2008-08-13 LAB — LACTATE DEHYDROGENASE: LDH: 160 U/L (ref 94–250)

## 2008-08-13 LAB — COMPREHENSIVE METABOLIC PANEL
ALT: 26 U/L (ref 0–53)
CO2: 26 mEq/L (ref 19–32)
Calcium: 9.2 mg/dL (ref 8.4–10.5)
Chloride: 101 mEq/L (ref 96–112)
Creatinine, Ser: 1.01 mg/dL (ref 0.40–1.50)
Glucose, Bld: 83 mg/dL (ref 70–99)
Total Protein: 6.6 g/dL (ref 6.0–8.3)

## 2008-08-19 LAB — BCR/ABL

## 2008-09-10 ENCOUNTER — Encounter: Admission: RE | Admit: 2008-09-10 | Discharge: 2008-09-10 | Payer: Self-pay | Admitting: Family Medicine

## 2008-11-10 ENCOUNTER — Ambulatory Visit: Payer: Self-pay | Admitting: Oncology

## 2008-11-12 LAB — COMPREHENSIVE METABOLIC PANEL
ALT: 36 U/L (ref 0–53)
AST: 44 U/L — ABNORMAL HIGH (ref 0–37)
Alkaline Phosphatase: 73 U/L (ref 39–117)
BUN: 16 mg/dL (ref 6–23)
Creatinine, Ser: 1.07 mg/dL (ref 0.40–1.50)
Potassium: 4.1 mEq/L (ref 3.5–5.3)

## 2008-11-12 LAB — CBC WITH DIFFERENTIAL/PLATELET
BASO%: 0.3 % (ref 0.0–2.0)
Basophils Absolute: 0 10*3/uL (ref 0.0–0.1)
EOS%: 1.4 % (ref 0.0–7.0)
HGB: 11.5 g/dL — ABNORMAL LOW (ref 13.0–17.1)
MCH: 32.8 pg (ref 27.2–33.4)
MCHC: 34.3 g/dL (ref 32.0–36.0)
MCV: 95.6 fL (ref 79.3–98.0)
MONO%: 8.2 % (ref 0.0–14.0)
NEUT%: 50.8 % (ref 39.0–75.0)
RDW: 13.7 % (ref 11.0–14.6)
lymph#: 1.3 10*3/uL (ref 0.9–3.3)

## 2008-11-20 LAB — BCR/ABL

## 2009-02-10 ENCOUNTER — Ambulatory Visit: Payer: Self-pay | Admitting: Oncology

## 2009-02-12 LAB — COMPREHENSIVE METABOLIC PANEL
ALT: 58 U/L — ABNORMAL HIGH (ref 0–53)
AST: 62 U/L — ABNORMAL HIGH (ref 0–37)
Albumin: 4.4 g/dL (ref 3.5–5.2)
Alkaline Phosphatase: 114 U/L (ref 39–117)
BUN: 16 mg/dL (ref 6–23)
Calcium: 10.1 mg/dL (ref 8.4–10.5)
Chloride: 104 mEq/L (ref 96–112)
Potassium: 4 mEq/L (ref 3.5–5.3)
Sodium: 141 mEq/L (ref 135–145)

## 2009-02-12 LAB — CBC WITH DIFFERENTIAL/PLATELET
BASO%: 0.4 % (ref 0.0–2.0)
Basophils Absolute: 0 10*3/uL (ref 0.0–0.1)
EOS%: 2.5 % (ref 0.0–7.0)
HGB: 11.7 g/dL — ABNORMAL LOW (ref 13.0–17.1)
MCH: 33.7 pg — ABNORMAL HIGH (ref 27.2–33.4)
MCHC: 33.9 g/dL (ref 32.0–36.0)
MCV: 99.3 fL — ABNORMAL HIGH (ref 79.3–98.0)
MONO%: 13.9 % (ref 0.0–14.0)
RBC: 3.48 10*6/uL — ABNORMAL LOW (ref 4.20–5.82)
RDW: 15.3 % — ABNORMAL HIGH (ref 11.0–14.6)
lymph#: 1.7 10*3/uL (ref 0.9–3.3)

## 2009-04-12 ENCOUNTER — Encounter: Admission: RE | Admit: 2009-04-12 | Discharge: 2009-04-12 | Payer: Self-pay | Admitting: Family Medicine

## 2009-05-12 ENCOUNTER — Ambulatory Visit: Payer: Self-pay | Admitting: Oncology

## 2009-05-14 LAB — COMPREHENSIVE METABOLIC PANEL
ALT: 38 U/L (ref 0–53)
AST: 36 U/L (ref 0–37)
BUN: 19 mg/dL (ref 6–23)
Creatinine, Ser: 1.14 mg/dL (ref 0.40–1.50)
Potassium: 3.4 mEq/L — ABNORMAL LOW (ref 3.5–5.3)
Sodium: 132 mEq/L — ABNORMAL LOW (ref 135–145)

## 2009-05-14 LAB — CBC WITH DIFFERENTIAL/PLATELET
BASO%: 0.3 % (ref 0.0–2.0)
Eosinophils Absolute: 0.1 10*3/uL (ref 0.0–0.5)
HCT: 34 % — ABNORMAL LOW (ref 38.4–49.9)
HGB: 11.5 g/dL — ABNORMAL LOW (ref 13.0–17.1)
MCV: 92.9 fL (ref 79.3–98.0)
MONO#: 0.5 10*3/uL (ref 0.1–0.9)
NEUT#: 1.5 10*3/uL (ref 1.5–6.5)
Platelets: 163 10*3/uL (ref 140–400)
RBC: 3.66 10*6/uL — ABNORMAL LOW (ref 4.20–5.82)
WBC: 3.9 10*3/uL — ABNORMAL LOW (ref 4.0–10.3)

## 2009-05-21 LAB — BCR/ABL

## 2009-08-11 ENCOUNTER — Ambulatory Visit: Payer: Self-pay | Admitting: Oncology

## 2009-08-13 LAB — COMPREHENSIVE METABOLIC PANEL
Albumin: 4.4 g/dL (ref 3.5–5.2)
Alkaline Phosphatase: 110 U/L (ref 39–117)
CO2: 24 mEq/L (ref 19–32)
Chloride: 94 mEq/L — ABNORMAL LOW (ref 96–112)
Creatinine, Ser: 1.06 mg/dL (ref 0.40–1.50)
Glucose, Bld: 80 mg/dL (ref 70–99)
Potassium: 4.1 mEq/L (ref 3.5–5.3)
Sodium: 130 mEq/L — ABNORMAL LOW (ref 135–145)
Total Protein: 7.3 g/dL (ref 6.0–8.3)

## 2009-08-13 LAB — CBC WITH DIFFERENTIAL/PLATELET
Basophils Absolute: 0 10*3/uL (ref 0.0–0.1)
EOS%: 1.8 % (ref 0.0–7.0)
Eosinophils Absolute: 0.1 10*3/uL (ref 0.0–0.5)
HCT: 34.8 % — ABNORMAL LOW (ref 38.4–49.9)
LYMPH%: 44 % (ref 14.0–49.0)
MCH: 32.7 pg (ref 27.2–33.4)
MCHC: 34.6 g/dL (ref 32.0–36.0)
MCV: 94.4 fL (ref 79.3–98.0)
MONO#: 0.4 10*3/uL (ref 0.1–0.9)
RBC: 3.68 10*6/uL — ABNORMAL LOW (ref 4.20–5.82)
RDW: 14.1 % (ref 11.0–14.6)
lymph#: 1.5 10*3/uL (ref 0.9–3.3)

## 2009-09-07 ENCOUNTER — Encounter: Admission: RE | Admit: 2009-09-07 | Discharge: 2009-09-07 | Payer: Self-pay | Admitting: Family Medicine

## 2009-11-22 ENCOUNTER — Ambulatory Visit: Payer: Self-pay | Admitting: Oncology

## 2009-11-24 LAB — COMPREHENSIVE METABOLIC PANEL
Alkaline Phosphatase: 72 U/L (ref 39–117)
Calcium: 9.1 mg/dL (ref 8.4–10.5)
Glucose, Bld: 89 mg/dL (ref 70–99)
Potassium: 3.5 mEq/L (ref 3.5–5.3)

## 2009-11-24 LAB — CBC WITH DIFFERENTIAL/PLATELET
EOS%: 1 % (ref 0.0–7.0)
Eosinophils Absolute: 0 10*3/uL (ref 0.0–0.5)
LYMPH%: 43.1 % (ref 14.0–49.0)
MCHC: 34.3 g/dL (ref 32.0–36.0)
MCV: 94.7 fL (ref 79.3–98.0)
MONO%: 11.6 % (ref 0.0–14.0)
NEUT#: 1.8 10*3/uL (ref 1.5–6.5)
RBC: 3.64 10*6/uL — ABNORMAL LOW (ref 4.20–5.82)
RDW: 14.1 % (ref 11.0–14.6)
WBC: 4.2 10*3/uL (ref 4.0–10.3)
lymph#: 1.8 10*3/uL (ref 0.9–3.3)

## 2010-03-23 ENCOUNTER — Ambulatory Visit: Payer: Self-pay | Admitting: Oncology

## 2010-03-24 LAB — CBC WITH DIFFERENTIAL/PLATELET
BASO%: 0.3 % (ref 0.0–2.0)
Basophils Absolute: 0 10*3/uL (ref 0.0–0.1)
EOS%: 1.8 % (ref 0.0–7.0)
HCT: 34.6 % — ABNORMAL LOW (ref 38.4–49.9)
MCV: 94.4 fL (ref 79.3–98.0)
MONO#: 0.4 10*3/uL (ref 0.1–0.9)
NEUT%: 49 % (ref 39.0–75.0)
Platelets: 152 10*3/uL (ref 140–400)
RDW: 14 % (ref 11.0–14.6)
lymph#: 1.6 10*3/uL (ref 0.9–3.3)

## 2010-03-24 LAB — COMPREHENSIVE METABOLIC PANEL
ALT: 50 U/L (ref 0–53)
Albumin: 4.5 g/dL (ref 3.5–5.2)
Alkaline Phosphatase: 87 U/L (ref 39–117)
CO2: 27 mEq/L (ref 19–32)
Calcium: 9.7 mg/dL (ref 8.4–10.5)
Chloride: 96 mEq/L (ref 96–112)
Glucose, Bld: 101 mg/dL — ABNORMAL HIGH (ref 70–99)
Sodium: 135 mEq/L (ref 135–145)
Total Bilirubin: 0.5 mg/dL (ref 0.3–1.2)

## 2010-07-15 NOTE — H&P (Signed)
NAME:  Darrell Russo, Darrell Russo NO.:  [id number]   MEDICAL RECORD NO.:  [id number]          PATIENT TYPE:  INP   LOCATION:  1516                         FACILITY:  [facility]   PHYSICIAN:  Myrtie Neither, MD      DATE OF BIRTH:  1975/06/01   DATE OF ADMISSION:  11/19/2005  DATE OF DISCHARGE:                                HISTORY & PHYSICAL   CHIEF COMPLAINT:  Painful deformed left ankle.   HISTORY OF PRESENT ILLNESS:  This is a 35 year old black male known autistic  patient who resides to a boarding facility with history of leukemia who had  tripped and fallen this morning with a sheet wrapped around himself as he  was getting out of bed and going to the bathroom.  The patient then turned  the left ankle and developed severe pain and deformity of the left ankle.  The patient also has a history of injury to his right ankle on the first of  September which had been developing swelling and pain off and on.   PAST MEDICAL HISTORY:  1. The patient has been treated for leukemia.  2. Autism.   No previous operations.   SOCIAL HISTORY:  No history of use of alcohol, illegal drugs, or tobacco.   FAMILY HISTORY:  High blood pressure in the mother.   ALLERGIES:  NONE KNOWN.   MEDICATIONS:  Multiple.  See medication list.   PHYSICAL EXAMINATION:  Alert and oriented, in no acute distress.  HEENT:  Normocephalic.  __________ .  Sclera clear.  NECK:  Supple.  CHEST:  Clear.  CARDIAC:  S1 and S2 regular.  ABDOMEN:  Soft, active bowel sounds.  EXTREMITIES:  Left ankle deformed, swollen, tender both medially and  laterally.  SKIN:  Intact.  Pulses intact.  Sensory is intact.  RIGHT ANKLE:  Tender deltoid with some evidence of old ecchymoses over the  medial aspect of the ankle, foot and arch area.  Pulse is intact.  Range of  motion is good. Some tenderness also along the anterior capsule.   X-rays of the left ankle revealed a fracture dislocation, trimalleolar.  Anterior  displacement of the foot.  Right ankle was x-rayed in the operating  room by way of C-arm.  No fractures.   IMPRESSION:  Fracture dislocation trimalleolar to the left ankle and sprain  of the right ankle.   PLAN:  ORIF, left ankle.      Myrtie Neither, MD  Electronically Signed     AC/MEDQ  D:  11/19/2005  T:  11/21/2005  Job:  [id number]

## 2010-07-15 NOTE — Discharge Summary (Signed)
NAME:  Darrell Russo, Darrell Russo               ACCOUNT NO.:  [id number]   MEDICAL RECORD NO.:  [id number]          PATIENT TYPE:  INP   LOCATION:  1516                         FACILITY:  [facility]   PHYSICIAN:  Myrtie Neither, MD      DATE OF BIRTH:  1975-09-02   DATE OF ADMISSION:  11/19/2005  DATE OF DISCHARGE:  11/21/2005                                 DISCHARGE SUMMARY   ADMISSION DIAGNOSES:  1. Fracture-dislocated left ankle.  2. Sprained right ankle.  3. Autism.   DISCHARGE DIAGNOSES:  1. Fracture-dislocated left ankle.  2. Sprained right ankle.  3. Autism.   COMPLICATIONS:  None.   INFECTIONS:  None.   OPERATION:  Open reduction and internal fixation of fracture-dislocated left  ankle.   PERTINENT HISTORY:  This is a 35 year old autistic patient who on trying to  get up out of the bed with a sheet wrapped around him going to the bathroom  tripped over the sheet, sustaining injury to his left ankle.  The patient  had a history of previous right ankle injury, which continued to be painful  and swollen and was evaluated for this also.   PAST MEDICAL HISTORY:  1. Autism.  2. History of leukemia.   PERTINENT PHYSICAL:  That of the left ankle fracture-dislocation, gross  deformity.  Neurovascular status was intact.  Tenderness, swelling.   X-ray revealed high fractured lateral malleolus and fractured medial  malleolus, dislocation anteriorly within the joint, and a chip fracture of  the posterior plafond.   HOSPITAL COURSE:  The patient underwent CBC, EKG, chest x-ray, CMET, UA.  The patient's laboratory studies were found to be stable enough to undergo  surgery and he was taken to the operating room and underwent ORIF of the  left ankle and application of a short-leg fiberglass cast.  Postop course  was fairly benign.  The patient, due to his autistic condition, will be  wheelchair-bound.  The patient was found to have a sprained right ankle and  was treated with an ankle brace.   The patient will be wheelchair-bound,  nonweightbearing.  A wheelchair is being supplied.   MEDICATIONS:  The patient will be on Ultram one to two q.6h. p.r.n. for  pain.  Patient to continue his preadmission medications;   1. Tegretol 400 mg b.i.d.  2. Minocin 100 mg b.i.d. (do not give with milk or other dairy products).  3. Depakote 3000 mg p.o. q.h.s.  4. Haldol 5 mg b.i.d.  5. Zonegran 200 mg q.h.s.  6. Hydrochlorothiazide 12.5 mg daily.  7. Paxil 40 mg daily.  8. Synthroid 250 mcg daily.  9. Seroquel 800 mg q.h.s.  10.Gleevec 200 mg b.i.d. w.c.  11.Metrogel topical b.i.d., apply a small amount, pea-size, to face.  12.Cogentin 0.5 mg b.i.d.  13.Tretinoin 0.025% cream q.h.s.  14.__________  5%/1% b.i.d.  15.Phenergan 12.5 mg q.4h. p.r.n.  16.Bactroban 2% t.i.d. p.r.n.   The patient will be seen back in the office in a two-week period.  The  patient is being discharged in stable and satisfactory condition.      Myrtie Neither, MD  Electronically Signed     AC/MEDQ  D:  11/21/2005  T:  11/21/2005  Job:  [id number]

## 2010-07-15 NOTE — Procedures (Signed)
[place]  Patient:    Darrell Russo, Darrell Russo                      MRN: [id number] Proc. Date: 03/02/00 Adm. Date:  [id number] Attending:  Charna Elizabeth                           Procedure Report  DATE OF BIRTH:  May 27, 1975.  PROCEDURE:  Esophagogastroduodenoscopy.  ENDOSCOPIST:  Anselmo Rod, M.D.  INSTRUMENT USED:  Olympus video panendoscope.  INDICATION FOR PROCEDURE:  Hematemesis in a 35 year old autistic male with mental retardation.  Patient has a history of pica and has recently ingested glass ornaments off the Christmas tree.  Patient thought to have some rectal bleeding along with the hematemesis.  The patient was evaluated at the [facility] Emergency Room the day before yesterday by Dr. Mancel Bale, and hemoglobin was stable with stable vital signs.  Patient was thereafter referred to me for further evaluation and possible endoscopy.  PREPROCEDURE PREPARATION:  Informed consent was procured from the patients mother, who is his guardian and power-of-attorney.  The patients caretaker, Ms. Mae Anselm Jungling, was also in the hospital with him from the group home.  A detailed history was procured from them, the chart was reviewed, his medications were reviewed, and consent was procured.  The patient was fasted for eight hours prior to the procedure.  PREPROCEDURE PHYSICAL:  VITAL SIGNS:  The patient had stable vital signs.  NECK:  Supple.  CHEST:  Clear to auscultation.  S1, S2 regular.  ABDOMEN:  Soft with normal abdominal bowel sounds.  No hepatosplenomegaly, no masses palpable.  Abdomen was nontender.  DESCRIPTION OF PROCEDURE:  The patient was placed in the left lateral decubitus position and sedated with 70 mg of Demerol and 7.5 mg of Versed intravenously.  Once the patient was adequately sedate and maintained on low-flow oxygen and continuous cardiac monitoring, the the Olympus video panendoscope was advanced through the  mouthpiece, over the tongue, into the esophagus, and under direct vision the entire esophagus appeared normal without evidence of ring, stricture, masses, lesions, esophagitis, or Barretts mucosa.  The scope was then advanced into the stomach.  No foreign body or glass was found in the stomach.  Entire gastric mucosa appeared healthy, and so did the proximal small bowel.  IMPRESSION:  Normal EGD.  RECOMMENDATIONS: 1. The patients caretakers have been advised to keep him away from glass    ornaments and glass objects that can cause further injury. 2. Nonsteroidals are to be avoided. 3. Outpatient follow-up is advised as needed. DD:  03/02/00 TD:  03/03/00 Job: [id number]

## 2010-07-15 NOTE — Op Note (Signed)
NAME:  Darrell Russo, Darrell Russo NO.:  [id number]   MEDICAL RECORD NO.:  [id number]          PATIENT TYPE:  INP   LOCATION:  1516                         FACILITY:  [facility]   PHYSICIAN:  Myrtie Neither, MD      DATE OF BIRTH:  [birth year]   DATE OF PROCEDURE:  11/19/2005  DATE OF DISCHARGE:                                 OPERATIVE REPORT   PREOPERATIVE DIAGNOSIS:  Fracture dislocation trimalleolar, left ankle.   POSTOPERATIVE DIAGNOSIS:  Fracture dislocation trimalleolar, left ankle.   ANESTHESIA:  General.   PROCEDURE:  Open reduction and internal fixation left ankle.   DESCRIPTION OF PROCEDURE:  The patient was taken to the operating room and  after given adequate preop medication given general anesthesia and  intubated.  The left lower leg was prepped with DuraPrep and draped in a  sterile manner.  Tourniquet will be used for hemostasis.  A lateral incision  was made over the lateral malleolus going through the skin and subcutaneous  tissue, soft tissue with subperiosteal elevators from the fracture site.  A  7 hole compression plate was applied with use of 6 screw.  This was reduced  in anatomic reduction.   Next a Kocher medial incision going through the skin, subcutaneous tissue,  down through the fascia, down through the fracture site.  Irrigation of the  hematoma and evacuation was done.  Manipulated reduction was done.  Two  malleolar screws were placed across the fracture site holding it in the  anatomic reduction.   Next 2-0 Vicryl was used to close both medial and lateral wounds, skin  staples for the skin. A bulky compressive dressing was applied and short leg  fiberglass cast applied.  The patient tolerated the procedure quite well and  went to the recovery room in stable and satisfactory condition.  The C-arm  was used to visualize the right ankle.  No fracture was identified.  Only  injury to the deltoid ligament.  The patient will be placed in ankle  brace  for this.      Myrtie Neither, MD  Electronically Signed     AC/MEDQ  D:  11/19/2005  T:  11/21/2005  Job:  [id number]

## 2010-08-09 ENCOUNTER — Emergency Department (HOSPITAL_BASED_OUTPATIENT_CLINIC_OR_DEPARTMENT_OTHER)
Admission: EM | Admit: 2010-08-09 | Discharge: 2010-08-09 | Disposition: A | Discharge status: Home / Self Care | Payer: Medicaid Other | Attending: Emergency Medicine | Admitting: Emergency Medicine

## 2010-08-09 ENCOUNTER — Emergency Department (INDEPENDENT_AMBULATORY_CARE_PROVIDER_SITE_OTHER): Payer: Medicaid Other

## 2010-08-09 DIAGNOSIS — S62319A Displaced fracture of base of unspecified metacarpal bone, initial encounter for closed fracture: Secondary | ICD-10-CM

## 2010-08-09 DIAGNOSIS — S92309A Fracture of unspecified metatarsal bone(s), unspecified foot, initial encounter for closed fracture: Secondary | ICD-10-CM | POA: Insufficient documentation

## 2010-08-09 DIAGNOSIS — M25579 Pain in unspecified ankle and joints of unspecified foot: Secondary | ICD-10-CM

## 2010-08-09 DIAGNOSIS — F79 Unspecified intellectual disabilities: Secondary | ICD-10-CM | POA: Insufficient documentation

## 2010-08-09 DIAGNOSIS — W19XXXA Unspecified fall, initial encounter: Secondary | ICD-10-CM | POA: Insufficient documentation

## 2010-08-09 DIAGNOSIS — I10 Essential (primary) hypertension: Secondary | ICD-10-CM | POA: Insufficient documentation

## 2010-08-09 DIAGNOSIS — E039 Hypothyroidism, unspecified: Secondary | ICD-10-CM | POA: Insufficient documentation

## 2010-08-09 DIAGNOSIS — Z79899 Other long term (current) drug therapy: Secondary | ICD-10-CM | POA: Insufficient documentation

## 2010-08-09 DIAGNOSIS — M79609 Pain in unspecified limb: Secondary | ICD-10-CM

## 2010-08-09 DIAGNOSIS — Y92009 Unspecified place in unspecified non-institutional (private) residence as the place of occurrence of the external cause: Secondary | ICD-10-CM | POA: Insufficient documentation

## 2010-09-22 ENCOUNTER — Other Ambulatory Visit: Payer: Self-pay | Admitting: Oncology

## 2010-09-22 ENCOUNTER — Encounter (HOSPITAL_BASED_OUTPATIENT_CLINIC_OR_DEPARTMENT_OTHER): Payer: Medicaid Other | Admitting: Oncology

## 2010-09-22 ENCOUNTER — Other Ambulatory Visit: Payer: Self-pay | Admitting: Family Medicine

## 2010-09-22 DIAGNOSIS — D649 Anemia, unspecified: Secondary | ICD-10-CM

## 2010-09-22 DIAGNOSIS — E039 Hypothyroidism, unspecified: Secondary | ICD-10-CM

## 2010-09-22 DIAGNOSIS — D61818 Other pancytopenia: Secondary | ICD-10-CM

## 2010-09-22 DIAGNOSIS — D6959 Other secondary thrombocytopenia: Secondary | ICD-10-CM

## 2010-09-22 DIAGNOSIS — C9210 Chronic myeloid leukemia, BCR/ABL-positive, not having achieved remission: Secondary | ICD-10-CM

## 2010-09-22 DIAGNOSIS — D72828 Other elevated white blood cell count: Secondary | ICD-10-CM

## 2010-09-22 LAB — COMPREHENSIVE METABOLIC PANEL
ALT: 28 U/L (ref 0–53)
Albumin: 4.5 g/dL (ref 3.5–5.2)
Calcium: 10.1 mg/dL (ref 8.4–10.5)
Glucose, Bld: 128 mg/dL — ABNORMAL HIGH (ref 70–99)
Potassium: 3.9 mEq/L (ref 3.5–5.3)
Sodium: 129 mEq/L — ABNORMAL LOW (ref 135–145)
Total Bilirubin: 0.4 mg/dL (ref 0.3–1.2)
Total Protein: 7.1 g/dL (ref 6.0–8.3)

## 2010-09-22 LAB — CBC WITH DIFFERENTIAL/PLATELET
EOS%: 0.9 % (ref 0.0–7.0)
LYMPH%: 37.2 % (ref 14.0–49.0)
MCH: 31.3 pg (ref 27.2–33.4)
MCHC: 34.9 g/dL (ref 32.0–36.0)
MONO#: 0.3 10*3/uL (ref 0.1–0.9)
MONO%: 8.8 % (ref 0.0–14.0)
NEUT#: 1.7 10*3/uL (ref 1.5–6.5)
Platelets: 142 10*3/uL (ref 140–400)
RDW: 12.9 % (ref 11.0–14.6)
WBC: 3.3 10*3/uL — ABNORMAL LOW (ref 4.0–10.3)

## 2010-09-26 ENCOUNTER — Ambulatory Visit
Admission: RE | Admit: 2010-09-26 | Discharge: 2010-09-26 | Disposition: A | Discharge status: Home / Self Care | Payer: Medicaid Other | Source: Ambulatory Visit | Attending: Family Medicine | Admitting: Family Medicine

## 2010-09-26 DIAGNOSIS — E039 Hypothyroidism, unspecified: Secondary | ICD-10-CM

## 2010-09-28 LAB — BCR/ABL (LIO MMD)

## 2010-12-22 IMAGING — CR DG ANKLE COMPLETE 3+V*L*
3 series · 3 of 3 positions shown · non-contrast
Comparison: 12/17/2006.

CLINICAL DATA: Left ankle pain.  Not sure of recent injury.

LEFT ANKLE COMPLETE - 3+ VIEW

[t ankle joint ap left]
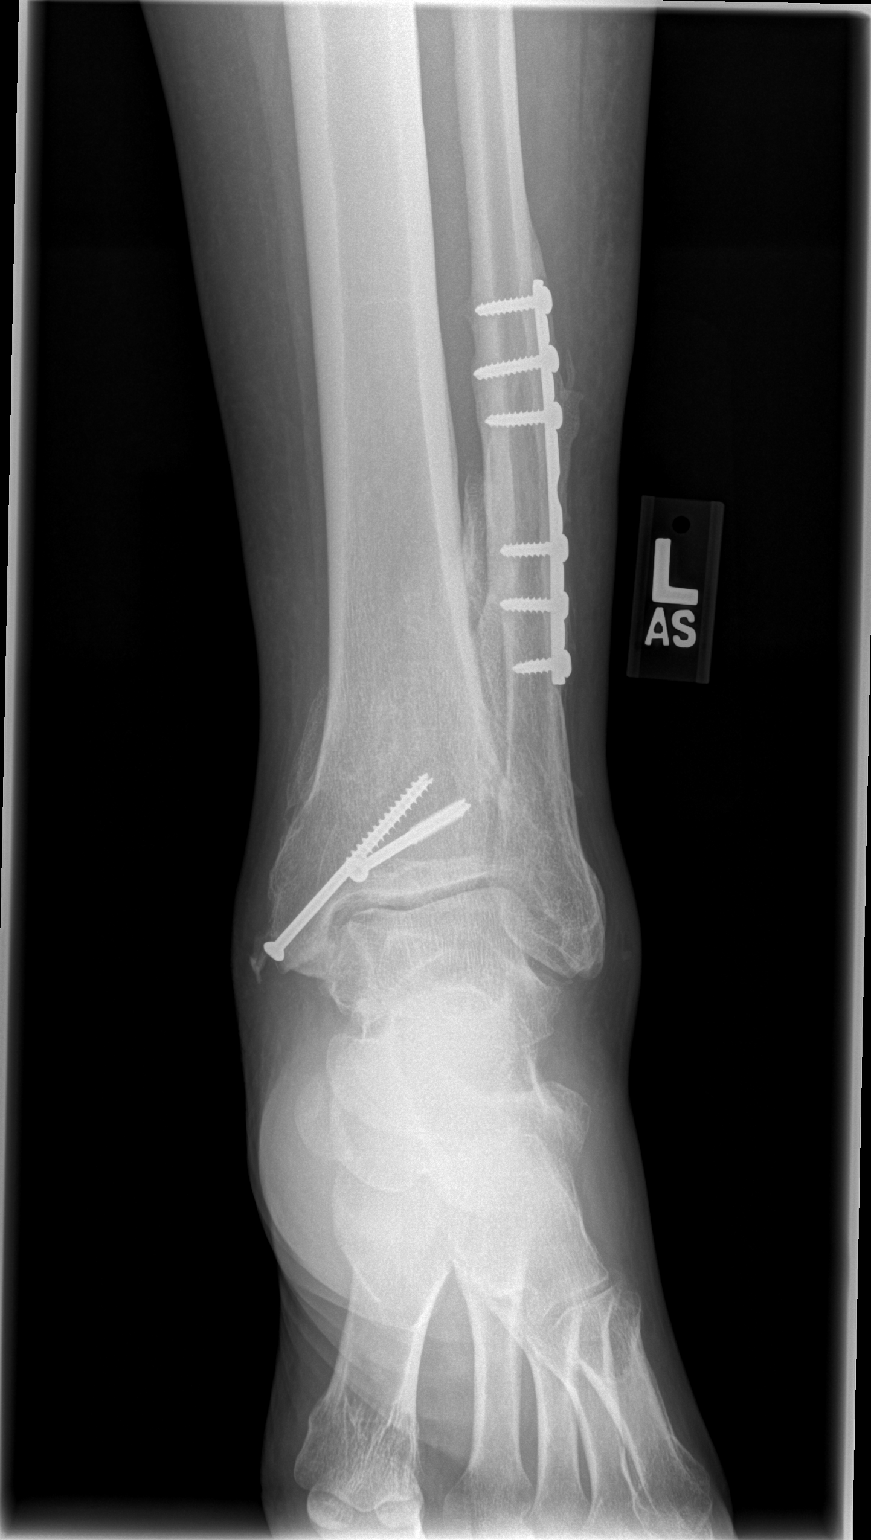

[t ankle joint oblique left]
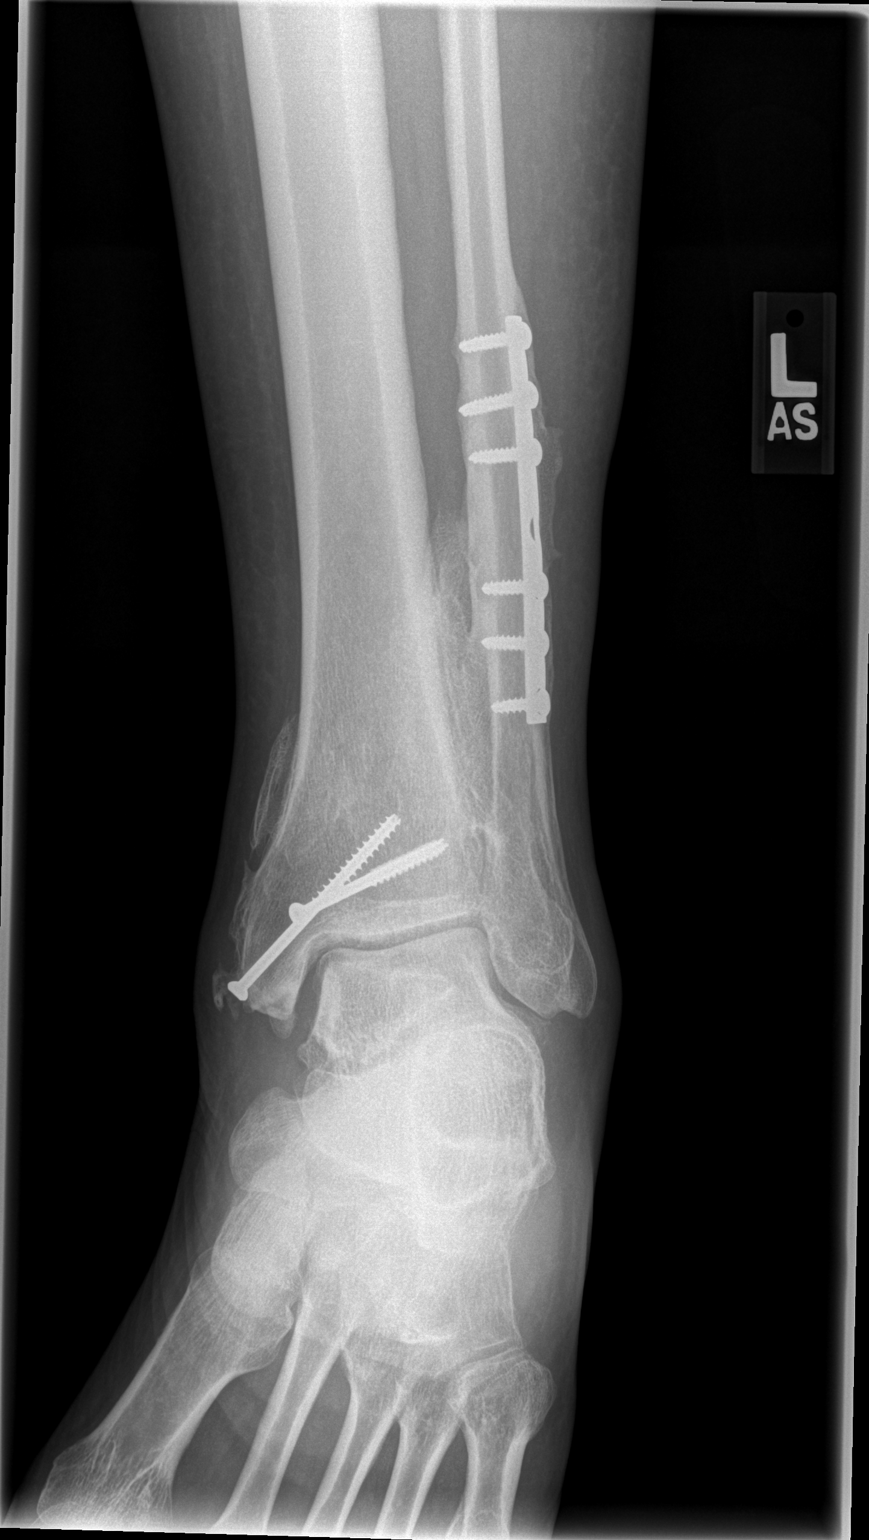

[t ankle joint lat left]
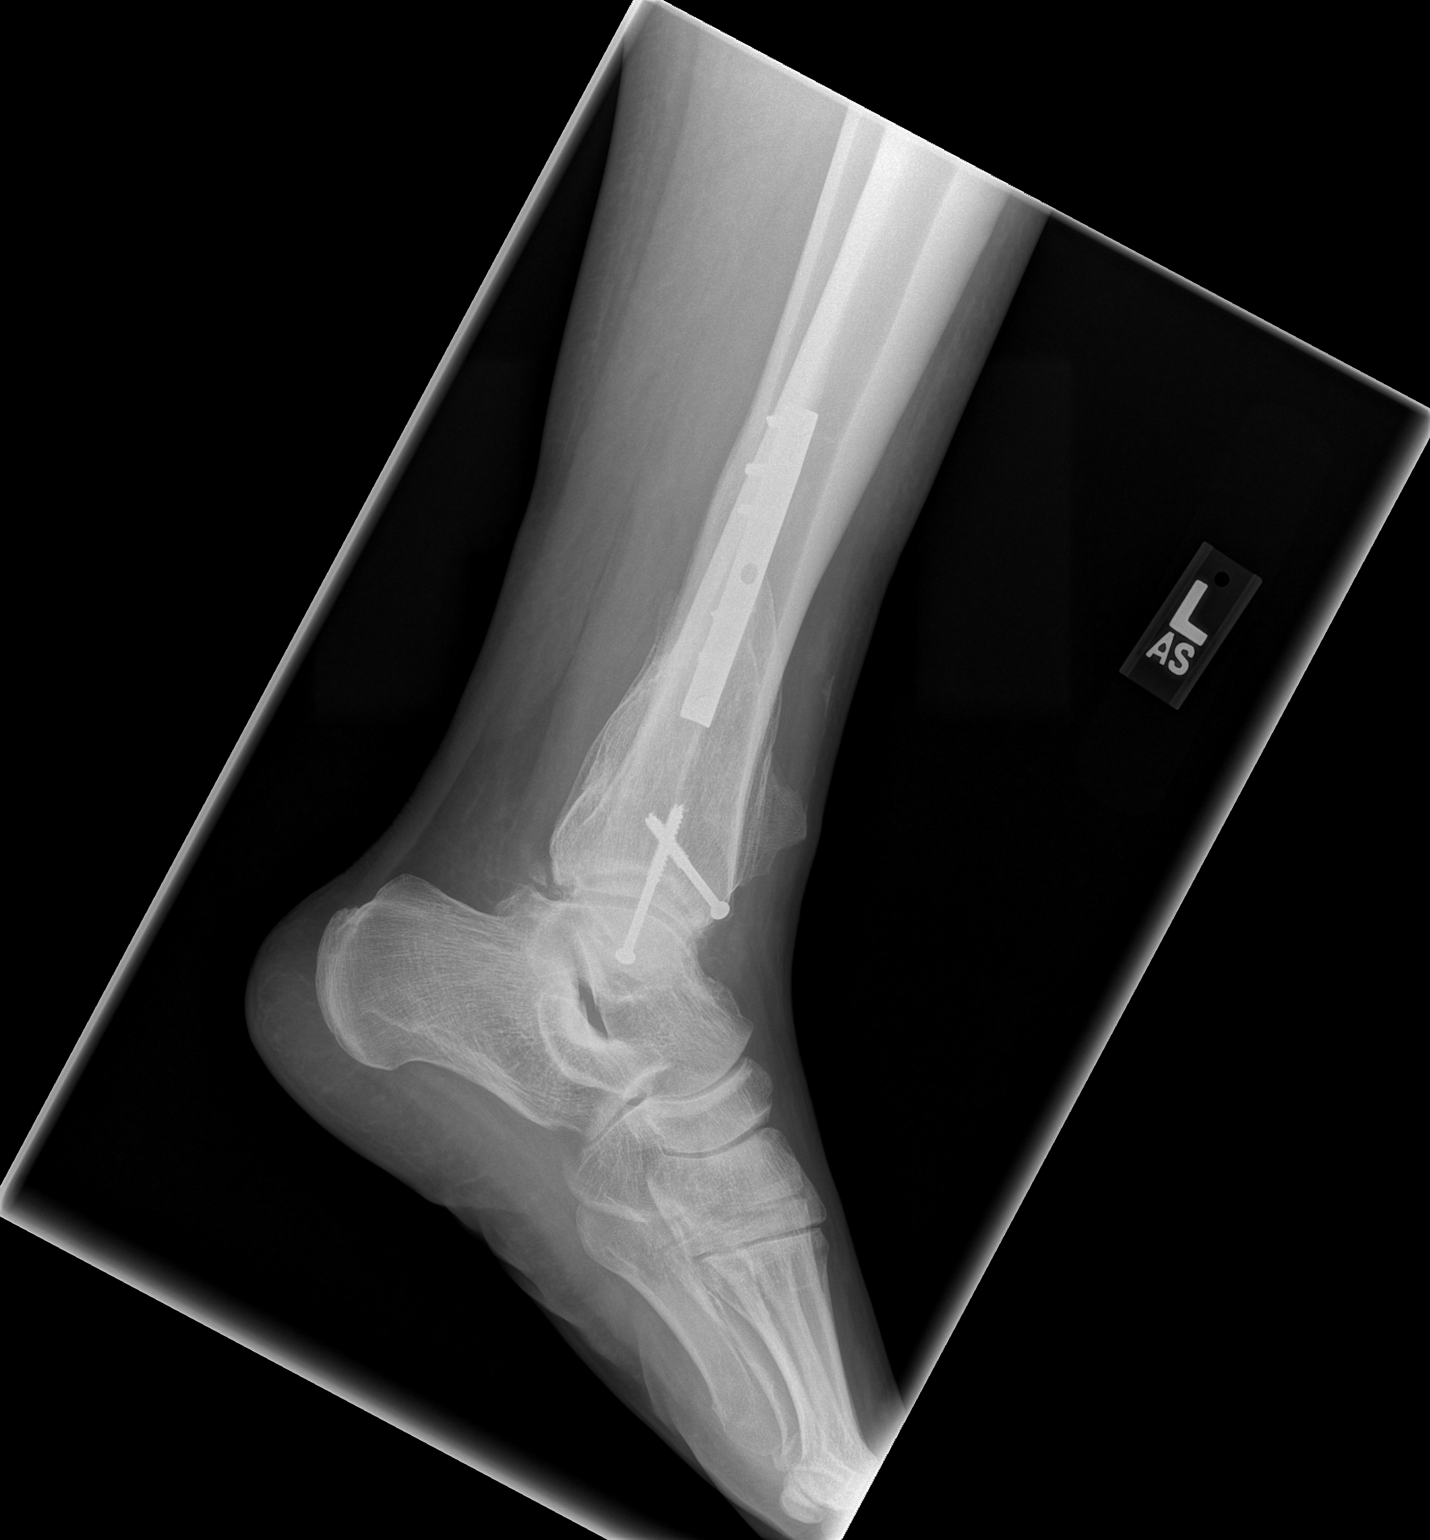

[3 of 3 positions shown; findings below may reference images not displayed]

FINDINGS: Status post prior surgery with side plate screws along
the left fibula and two screws within the distal tibia.  No
findings of acute fracture or dislocation.
IMPRESSION: Remote fracture with surgery.  No acute fracture.

## 2010-12-26 ENCOUNTER — Other Ambulatory Visit: Payer: Self-pay

## 2010-12-26 ENCOUNTER — Emergency Department (HOSPITAL_BASED_OUTPATIENT_CLINIC_OR_DEPARTMENT_OTHER)
Admission: EM | Admit: 2010-12-26 | Discharge: 2010-12-26 | Disposition: A | Discharge status: Other Acute Inpatient Hospital | Payer: Medicaid Other | Attending: Emergency Medicine | Admitting: Emergency Medicine

## 2010-12-26 ENCOUNTER — Emergency Department (INDEPENDENT_AMBULATORY_CARE_PROVIDER_SITE_OTHER): Payer: Medicaid Other

## 2010-12-26 ENCOUNTER — Encounter: Payer: Self-pay | Admitting: Family Medicine

## 2010-12-26 DIAGNOSIS — R269 Unspecified abnormalities of gait and mobility: Secondary | ICD-10-CM

## 2010-12-26 DIAGNOSIS — W2209XA Striking against other stationary object, initial encounter: Secondary | ICD-10-CM | POA: Insufficient documentation

## 2010-12-26 DIAGNOSIS — F840 Autistic disorder: Secondary | ICD-10-CM | POA: Insufficient documentation

## 2010-12-26 DIAGNOSIS — S0990XA Unspecified injury of head, initial encounter: Secondary | ICD-10-CM

## 2010-12-26 DIAGNOSIS — Z79899 Other long term (current) drug therapy: Secondary | ICD-10-CM | POA: Insufficient documentation

## 2010-12-26 HISTORY — DX: Leukemia, unspecified not having achieved remission: C95.90

## 2010-12-26 HISTORY — DX: Impulse disorder, unspecified: F63.9

## 2010-12-26 HISTORY — DX: Dislocation of jaw, unspecified side, initial encounter: S03.00XA

## 2010-12-26 HISTORY — DX: Follicular disorder, unspecified: L73.9

## 2010-12-26 HISTORY — DX: Autistic disorder: F84.0

## 2010-12-26 LAB — CBC
Hemoglobin: 11.5 g/dL — ABNORMAL LOW (ref 13.0–17.0)
MCHC: 35.8 g/dL (ref 30.0–36.0)
Platelets: 121 10*3/uL — ABNORMAL LOW (ref 150–400)

## 2010-12-26 LAB — BASIC METABOLIC PANEL
Calcium: 9.7 mg/dL (ref 8.4–10.5)
GFR calc Af Amer: >90 mL/min (ref >90)
GFR calc non Af Amer: >90 mL/min (ref >90)
Glucose, Bld: 89 mg/dL (ref 70–99)
Potassium: 3.8 mEq/L (ref 3.5–5.1)
Sodium: 133 mEq/L — ABNORMAL LOW (ref 135–145)

## 2010-12-26 LAB — POCT I-STAT 3, VENOUS BLOOD GAS (G3P V)
O2 Saturation: 34 %
pCO2, Ven: 50.3 mmHg — ABNORMAL HIGH (ref 45.0–50.0)
pH, Ven: 7.349 — ABNORMAL HIGH (ref 7.250–7.300)
pO2, Ven: 19 mmHg — CL (ref 30.0–45.0)

## 2010-12-26 LAB — CARBAMAZEPINE LEVEL, TOTAL: Carbamazepine Lvl: 13.2 ug/mL — ABNORMAL HIGH (ref 4.0–12.0)

## 2010-12-26 NOTE — ED Notes (Signed)
Caregiver sts pt bloodwork "is drawn every morning". Nurse at Group Home, Vikki Ports RN called and aware of discharge instructions.

## 2010-12-26 NOTE — ED Notes (Signed)
MD at bedside. 

## 2010-12-26 NOTE — ED Notes (Signed)
Dr. Jeraldine Loots aware of vitals and observed pt ambulating without difficulty.

## 2010-12-26 NOTE — ED Notes (Signed)
Pt able to ambulate without difficulty. Pt sts he wants to go to go home. Pt alert and at baseline mental status.

## 2010-12-26 NOTE — ED Notes (Signed)
Vikki Ports from Group Home sts pt last recorded temperature was taken in February and was 97.8 oral.

## 2010-12-26 NOTE — ED Provider Notes (Signed)
History     CSN: [id number] Arrival date & time: 12/26/2010  8:20 AM   First MD Initiated Contact with Patient 12/26/10 [phone]      Chief Complaint  Patient presents with  . Gait Problem    (Consider location/radiation/quality/duration/timing/severity/associated sxs/prior treatment) HPI The patient presents from his group home with a caregiver. The patient has autism, and is incapable of giving much history of present illness. Per the caregiver, and reports, the patient had an episode just prior to presentation where he was observed to not have a steady gait and bumped his head into a door threshold. It is unclear if the patient's gait was abnormal prior to this event or following head trauma. There is no noted loss of consciousness, nor any noted emesis, or change in behavior. Past Medical History  Diagnosis Date  . Autism   . Impulse control disorder   . Leukemia   . Folliculitis   . TMJ (dislocation of temporomandibular joint)     No past surgical history on file.  No family history on file.  History  Substance Use Topics  . Smoking status: Never Smoker   . Smokeless tobacco: Not on file  . Alcohol Use: No      Review of Systems  Unable to perform ROS: Psychiatric disorder    Allergies  Review of patient's allergies indicates no known allergies.  Home Medications   Current Outpatient Rx  Name Route Sig Dispense Refill  . CARBAMAZEPINE (ANTIPSYCHOTIC) 200 MG PO CP12 Oral Take 200 mg by mouth 2 (two) times daily.      Marland Kitchen DIVALPROEX SODIUM 500 MG PO TB24 Oral Take 2,500 mg by mouth daily.      Marland Kitchen HYDROCHLOROTHIAZIDE 25 MG PO TABS Oral Take 25 mg by mouth daily.      . IMATINIB MESYLATE 100 MG PO TABS Oral Take 100 mg by mouth daily. Take with meals and large glass of water.Caution:Chemotherapy     . LEVOTHYROXINE SODIUM 300 MCG PO TABS Oral Take 300 mcg by mouth daily.      Marland Kitchen LIOTHYRONINE SODIUM 25 MCG PO TABS Oral Take 25 mcg by mouth daily.      Marland Kitchen METOPROLOL  SUCCINATE 50 MG PO TB24 Oral Take 50 mg by mouth daily.      Marland Kitchen MINOCYCLINE HCL 100 MG PO CAPS Oral Take 100 mg by mouth 2 (two) times daily.      Marland Kitchen MUPIROCIN 2 % EX OINT Topical Apply topically 3 (three) times daily.      Marland Kitchen NAPROXEN 500 MG PO TABS Oral Take 500 mg by mouth 2 (two) times daily with a meal.      . QUETIAPINE FUMARATE 50 MG PO TB24 Oral Take 200 mg by mouth.      . TRAMADOL HCL 50 MG PO TABS Oral Take 50 mg by mouth every 6 (six) hours as needed. Maximum dose= 8 tablets per day     . TRIAZOLAM 0.25 MG PO TABS Oral Take 0.25 mg by mouth at bedtime as needed.      Marland Kitchen ZONISAMIDE 100 MG PO CAPS Oral Take 300 mg by mouth daily.        BP 105/66  Pulse 54  Resp 20  SpO2 100%  Physical Exam  Constitutional: He appears well-developed and well-nourished. No distress.  HENT:  Head: Atraumatic.  Mouth/Throat: Oropharynx is clear and moist. No oropharyngeal exudate.  Eyes: Conjunctivae and EOM are normal. Pupils are equal, round, and reactive to light.  Neck: Normal range of motion. Neck supple. No thyromegaly present.  Cardiovascular: Normal rate.   Pulmonary/Chest: Effort normal and breath sounds normal. No stridor.  Abdominal: Soft. He exhibits no distension. There is no tenderness. There is no rebound and no guarding.  Musculoskeletal: He exhibits no edema and no tenderness.  Lymphadenopathy:    He has no cervical adenopathy.  Neurological: He is alert. No cranial nerve deficit.       Patient has mildly ataxic gait.  Skin: Skin is warm and dry. No rash noted. He is not diaphoretic. No erythema.  Psychiatric: His affect is blunt. His speech is delayed. His speech is not rapid and/or pressured and not slurred. He is slowed and withdrawn. Cognition and memory are impaired.    ED Course  Procedures (including critical care time)  Labs Reviewed  CARBAMAZEPINE LEVEL, TOTAL - Abnormal; Notable for the following:    Carbamazepine Lvl 13.2 (*)    All other components within normal  limits  CBC - Abnormal; Notable for the following:    WBC 3.0 (*) RESULT REPEATED AND VERIFIED   RBC 3.57 (*)    Hemoglobin 11.5 (*)    HCT 32.1 (*)    Platelets 121 (*)    All other components within normal limits  BASIC METABOLIC PANEL - Abnormal; Notable for the following:    Sodium 133 (*)    All other components within normal limits   Ct Head Wo Contrast  12/26/2010  *RADIOLOGY REPORT*  Clinical Data: Unsteady gait  CT HEAD WITHOUT CONTRAST  Technique:  Contiguous axial images were obtained from the base of the skull through the vertex without contrast.  Comparison: 11/23/2005  Findings: Diminished exam detail through the skull base and posterior fossa due to motion artifact.  The brain has and otherwise normal appearance without evidence for hemorrhage, infarction, hydrocephalus, or mass lesion.  There is no extra axial fluid collection.  The skull and paranasal sinuses are normal.  IMPRESSION: No acute intracranial abnormalities noted.  Original Report Authenticated By: Rosealee Albee, M.D.     No diagnosis found.    MDM  This 35 year old male presents from his group home to to a possible trauma. The patient has autism, as well as a seizure disorder for which he takes Tegretol. Patient says the exam is unremarkable, no evidence of trauma. His head CT does not demonstrate acute pathology. Notably the patient's Tegretol level is beyond therapeutic levels, discussion with the group home confirms that the patient is chronically elevated. The patient's labs are notable for mild acidosis, though with compensation; suggestive of chronic process. The patient was also notably mildly hypothermic, without clear etiology. Following ED evaluation, the patient was back to his baseline per group home staff, eating and drinking freely and with no complaints.  Although the patient's ED evaluation demonstrated abnormal lab results, and mild hypophonia, the patient's benign appearance, continued by  mouth tolerance, and the fact that he is going back to a monitored facility that can safe for discharge him home with explicit return precautions, and instructions to have the patient's Tegretol dose held for the next day.        Gerhard Munch, MD 12/26/10 [phone]

## 2010-12-26 NOTE — ED Notes (Signed)
Pt is at group home "[facility]". Staff member brought pt to ED and sts "he isn't walking normal and walked into a wall and bumped his head". Per Staff pt mental status at baseline.

## 2011-03-01 ENCOUNTER — Telehealth: Payer: Self-pay | Admitting: Oncology

## 2011-03-01 NOTE — Telephone Encounter (Signed)
seemed to be a business # as well as [phone].  Appt mailed to pt,aom

## 2011-03-09 ENCOUNTER — Telehealth: Payer: Self-pay | Admitting: Oncology

## 2011-03-09 NOTE — Telephone Encounter (Signed)
Returned call to pt's mother diane mcdonald [phone]) and [facility] re appt 2/21. Mother to call back if she has any question and to change demographic info if what we have is not correct.

## 2011-03-27 ENCOUNTER — Encounter: Payer: Self-pay | Admitting: *Deleted

## 2011-03-29 ENCOUNTER — Telehealth: Payer: Self-pay | Admitting: Oncology

## 2011-03-29 NOTE — Telephone Encounter (Signed)
S/w [facility] @ pt's facility ([facility]). Informed [facility] that 2/21 appt has been moved to 3/14 @ 1pm. Per [facility] they will contact pt's mom. appt moved due to call day - date per FS.

## 2011-04-20 ENCOUNTER — Other Ambulatory Visit: Payer: Medicaid Other | Admitting: Lab

## 2011-04-20 ENCOUNTER — Ambulatory Visit: Payer: Medicaid Other | Admitting: Oncology

## 2011-04-24 ENCOUNTER — Ambulatory Visit: Payer: Self-pay

## 2011-05-11 ENCOUNTER — Telehealth: Payer: Self-pay | Admitting: Oncology

## 2011-05-11 ENCOUNTER — Other Ambulatory Visit (HOSPITAL_BASED_OUTPATIENT_CLINIC_OR_DEPARTMENT_OTHER): Payer: Medicaid Other | Admitting: Lab

## 2011-05-11 ENCOUNTER — Ambulatory Visit (HOSPITAL_BASED_OUTPATIENT_CLINIC_OR_DEPARTMENT_OTHER): Payer: Medicaid Other | Admitting: Oncology

## 2011-05-11 VITALS — BP 134/88 | HR 83 | Temp 97.2°F | Ht 72.0 in | Wt 239.7 lb

## 2011-05-11 DIAGNOSIS — C9210 Chronic myeloid leukemia, BCR/ABL-positive, not having achieved remission: Secondary | ICD-10-CM

## 2011-05-11 DIAGNOSIS — D649 Anemia, unspecified: Secondary | ICD-10-CM

## 2011-05-11 DIAGNOSIS — C9211 Chronic myeloid leukemia, BCR/ABL-positive, in remission: Secondary | ICD-10-CM

## 2011-05-11 DIAGNOSIS — D72819 Decreased white blood cell count, unspecified: Secondary | ICD-10-CM

## 2011-05-11 LAB — CBC WITH DIFFERENTIAL/PLATELET
BASO%: 0.4 % (ref 0.0–2.0)
Eosinophils Absolute: 0.1 10*3/uL (ref 0.0–0.5)
LYMPH%: 36.6 % (ref 14.0–49.0)
MCHC: 34.7 g/dL (ref 32.0–36.0)
MONO#: 0.3 10*3/uL (ref 0.1–0.9)
NEUT#: 1.7 10*3/uL (ref 1.5–6.5)
RBC: 3.78 10*6/uL — ABNORMAL LOW (ref 4.20–5.82)
RDW: 13.8 % (ref 11.0–14.6)
WBC: 3.3 10*3/uL — ABNORMAL LOW (ref 4.0–10.3)
lymph#: 1.2 10*3/uL (ref 0.9–3.3)

## 2011-05-11 LAB — COMPREHENSIVE METABOLIC PANEL
ALT: 48 U/L (ref 0–53)
Albumin: 4.6 g/dL (ref 3.5–5.2)
Alkaline Phosphatase: 83 U/L (ref 39–117)
CO2: 24 mEq/L (ref 19–32)
Glucose, Bld: 110 mg/dL — ABNORMAL HIGH (ref 70–99)
Potassium: 3.8 mEq/L (ref 3.5–5.3)
Sodium: 135 mEq/L (ref 135–145)
Total Bilirubin: 0.4 mg/dL (ref 0.3–1.2)
Total Protein: 6.9 g/dL (ref 6.0–8.3)

## 2011-05-11 NOTE — Progress Notes (Signed)
Hematology and Oncology Follow Up Visit  Darrell Russo [id number] 20-Feb-1976 36 y.o. 05/11/2011 1:35 PM  CC: Ace Gins, MD    Principle Diagnosis: This is a 36 year old gentleman with chronic phase CML diagnosed in 2007, presented with leukocytosis and BCR-ABL positive peripheral blood.   Current therapy: He is on Gleevec 400 mg total dose.  He has a hematological and molecular remission since May 2007.  Interim History: Darrell Russo presents today for a followup visit accompanied by his mother and reporting really no major problems since the last time I saw him.  He had not reported any hospitalizations or illnesses.  Had not reported any toxicity associated with the Gleevec.  His performance status and activity level remains at the same level at this point.  He is not reporting any toxicity associated with the Gleevec.  He does not report any fatigue.  He does not report any bleeding.  Overall activity level remain stable. He still resides in a group home but spends a lot time with his mother and brothers  Medications: I have reviewed the patient's current medications. Current outpatient prescriptions:carbamazepine (ANTIPSYCHOTIC - EQUETRO) 200 MG CP12, Take 200 mg by mouth 2 (two) times daily.  , Disp: , Rfl: ;  divalproex (DEPAKOTE ER) 500 MG 24 hr tablet, Take 2,500 mg by mouth daily.  , Disp: , Rfl: ;  hydrochlorothiazide (HYDRODIURIL) 25 MG tablet, Take 25 mg by mouth daily.  , Disp: , Rfl:  imatinib (GLEEVEC) 100 MG tablet, Take 100 mg by mouth daily. Take with meals and large glass of water.Caution:Chemotherapy , Disp: , Rfl: ;  levothyroxine (LEVOTHROID) 300 MCG tablet, Take 300 mcg by mouth daily.  , Disp: , Rfl: ;  liothyronine (CYTOMEL) 25 MCG tablet, Take 25 mcg by mouth daily.  , Disp: , Rfl: ;  metoprolol (TOPROL-XL) 50 MG 24 hr tablet, Take 50 mg by mouth daily.  , Disp: , Rfl:  minocycline (MINOCIN,DYNACIN) 100 MG capsule, Take 100 mg by mouth 2 (two) times daily.  , Disp: , Rfl: ;   mupirocin (BACTROBAN) 2 % ointment, Apply topically 3 (three) times daily.  , Disp: , Rfl: ;  naproxen (NAPROSYN) 500 MG tablet, Take 500 mg by mouth 2 (two) times daily with a meal.  , Disp: , Rfl: ;  QUEtiapine (SEROQUEL XR) 50 MG TB24, Take 200 mg by mouth.  , Disp: , Rfl:  traMADol (ULTRAM) 50 MG tablet, Take 50 mg by mouth every 6 (six) hours as needed. Maximum dose= 8 tablets per day , Disp: , Rfl: ;  triazolam (HALCION) 0.25 MG tablet, Take 0.25 mg by mouth at bedtime as needed.  , Disp: , Rfl: ;  zonisamide (ZONEGRAN) 100 MG capsule, Take 300 mg by mouth daily.  , Disp: , Rfl:   Allergies: No Known Allergies  Past Medical History, Surgical history, Social history, and Family History were reviewed and updated.  Review of Systems: Constitutional:  Negative for fever, chills, night sweats, anorexia, weight loss, pain. Cardiovascular: no chest pain or dyspnea on exertion Respiratory: no cough, shortness of breath, or wheezing Neurological: no TIA or stroke symptoms Dermatological: negative ENT: negative Skin: Negative. Gastrointestinal: no abdominal pain, change in bowel habits, or black or bloody stools Genito-Urinary: negative Hematological and Lymphatic: negative Breast: negative Musculoskeletal: negative Remaining ROS negative. Physical Exam: Blood pressure 134/88, pulse 83, temperature 97.2 F (36.2 C), temperature source Oral, height 6' (1.829 m), weight 239 lb 11.2 oz (108.727 kg). ECOG: 1 General appearance: alert Head:  Normocephalic, without obvious abnormality, atraumatic Neck: no adenopathy, no carotid bruit, no JVD, supple, symmetrical, trachea midline and thyroid not enlarged, symmetric, no tenderness/mass/nodules Lymph nodes: Cervical, supraclavicular, and axillary nodes normal. Heart:regular rate and rhythm, S1, S2 normal, no murmur, click, rub or gallop Lung:chest clear, no wheezing, rales, normal symmetric air entry Abdomin: soft, non-tender, without masses or  organomegaly EXT:no erythema, induration, or nodules   Lab Results: Lab Results  Component Value Date   WBC 3.3* 05/11/2011   HGB 12.6* 05/11/2011   HCT 36.2* 05/11/2011   MCV 95.8 05/11/2011   PLT 144 05/11/2011     Chemistry      Component Value Date/Time   NA 133* 12/26/2010 0955   K 3.8 12/26/2010 0955   CL 96 12/26/2010 0955   CO2 28 12/26/2010 0955   BUN 20 12/26/2010 0955   CREATININE 0.90 12/26/2010 0955      Component Value Date/Time   CALCIUM 9.7 12/26/2010 0955   ALKPHOS 97 09/22/2010 0912   AST 33 09/22/2010 0912   ALT 28 09/22/2010 0912   BILITOT 0.4 09/22/2010 0912       Impression and Plan:  This is a 36 year old gentleman with the following issues. 1. Chronic phase CML.  He is on Gleevec since May 2007.  He continues to be with complete hematological and molecular remission.  I will repeat his BCR-ABL in 6 months.  His liver function tests have been done routinely without any abnormalities.  At this time being will continue Darrell Russo on [employer] given his excellent response. His last BCR-ABL testing in 08/2010 shows continued complete molecular remission.   2. Mild anemia and leukopenia.  This is undoubtedly dilated Gleevec effect, but not enough to suggest or recommend changes in the Gleevec dose.  Eli Hose, MD 3/14/20131:35 PM

## 2011-05-11 NOTE — Telephone Encounter (Signed)
appts made and printed for pt aom °

## 2011-05-11 NOTE — Progress Notes (Signed)
Left message on voice mail for laren mullis social worker to call patient's mom valerie @ [phone] to discuss end of life issues and life insurance. Per dr Alver Fisher request.

## 2011-05-16 LAB — BCR/ABL (LIO MMD)

## 2011-09-02 ENCOUNTER — Emergency Department (HOSPITAL_COMMUNITY)
Admission: EM | Admit: 2011-09-02 | Discharge: 2011-09-02 | Disposition: A | Discharge status: Home / Self Care | Payer: Medicaid Other | Attending: Emergency Medicine | Admitting: Emergency Medicine

## 2011-09-02 ENCOUNTER — Encounter (HOSPITAL_COMMUNITY): Payer: Self-pay | Admitting: Emergency Medicine

## 2011-09-02 DIAGNOSIS — F840 Autistic disorder: Secondary | ICD-10-CM | POA: Insufficient documentation

## 2011-09-02 DIAGNOSIS — Z79899 Other long term (current) drug therapy: Secondary | ICD-10-CM | POA: Insufficient documentation

## 2011-09-02 DIAGNOSIS — F79 Unspecified intellectual disabilities: Secondary | ICD-10-CM | POA: Insufficient documentation

## 2011-09-02 DIAGNOSIS — L03119 Cellulitis of unspecified part of limb: Secondary | ICD-10-CM | POA: Insufficient documentation

## 2011-09-02 DIAGNOSIS — M7989 Other specified soft tissue disorders: Secondary | ICD-10-CM

## 2011-09-02 DIAGNOSIS — L0390 Cellulitis, unspecified: Secondary | ICD-10-CM

## 2011-09-02 DIAGNOSIS — L02419 Cutaneous abscess of limb, unspecified: Secondary | ICD-10-CM | POA: Insufficient documentation

## 2011-09-02 DIAGNOSIS — I10 Essential (primary) hypertension: Secondary | ICD-10-CM | POA: Insufficient documentation

## 2011-09-02 HISTORY — DX: Essential (primary) hypertension: I10

## 2011-09-02 LAB — CBC WITH DIFFERENTIAL/PLATELET
Basophils Absolute: 0 10*3/uL (ref 0.0–0.1)
Basophils Relative: 0 % (ref 0–1)
Eosinophils Relative: 1 % (ref 0–5)
HCT: 32 % — ABNORMAL LOW (ref 39.0–52.0)
MCHC: 35.6 g/dL (ref 30.0–36.0)
MCV: 91.2 fL (ref 78.0–100.0)
Monocytes Absolute: 1.3 10*3/uL — ABNORMAL HIGH (ref 0.1–1.0)
Platelets: 132 10*3/uL — ABNORMAL LOW (ref 150–400)
RDW: 13 % (ref 11.5–15.5)
WBC: 6.4 10*3/uL (ref 4.0–10.5)

## 2011-09-02 LAB — BASIC METABOLIC PANEL
Calcium: 9.7 mg/dL (ref 8.4–10.5)
Creatinine, Ser: 0.82 mg/dL (ref 0.50–1.35)
GFR calc Af Amer: >90 mL/min (ref >90)
GFR calc non Af Amer: >90 mL/min (ref >90)
Sodium: 124 mEq/L — ABNORMAL LOW (ref 135–145)

## 2011-09-02 LAB — URIC ACID: Uric Acid, Serum: 2.1 mg/dL — ABNORMAL LOW (ref 4.0–7.8)

## 2011-09-02 LAB — C-REACTIVE PROTEIN: CRP: 5.88 mg/dL — ABNORMAL HIGH (ref <0.60)

## 2011-09-02 MED ORDER — CEPHALEXIN 500 MG PO CAPS: 500.0000 mg | ORAL_CAPSULE | Freq: Four times a day (QID) | ORAL | Status: AC

## 2011-09-02 MED ORDER — TRAMADOL HCL 50 MG PO TABS: 50.0000 mg | ORAL_TABLET | Freq: Four times a day (QID) | ORAL | Status: AC | PRN

## 2011-09-02 MED ORDER — SODIUM CHLORIDE 0.9 % IV SOLN
Freq: Once | INTRAVENOUS | Status: AC
  Administered 2011-09-02: 18:00:00 via INTRAVENOUS

## 2011-09-02 MED ORDER — DEXTROSE 5 % IV SOLN
1.0000 g | Freq: Once | INTRAVENOUS | Status: AC
  Administered 2011-09-02: 1 g via INTRAVENOUS
  Filled 2011-09-02: qty 10

## 2011-09-02 NOTE — ED Notes (Signed)
R/ankle and leg pain, swelling x 48 hrs.  Seen yesterday at [facility]- xray negative

## 2011-09-02 NOTE — ED Notes (Signed)
Redness and tenderness on calf. Pt from group home

## 2011-09-02 NOTE — ED Provider Notes (Signed)
Medical screening examination/treatment/procedure(s) were conducted as a shared visit with non-physician practitioner(s) and myself.  I personally evaluated the patient during the encounter  Few days gradual onset right lower leg localized cellulitis, right ankle joint and foot NT with good movement, DP intact and CR<2secs with baseline LT and foot movement, will get [place] to r/o DVT but feel unlikely.1700  Hurman Horn, MD 09/03/11 1301

## 2011-09-02 NOTE — ED Provider Notes (Signed)
History     CSN: [id number]  Arrival date & time 09/02/11  1510   First MD Initiated Contact with Patient 09/02/11 1514      Chief Complaint  Patient presents with  . Joint Swelling  . Leg Swelling  . Rash    redness over calf -cellulitus    (Consider location/radiation/quality/duration/timing/severity/associated sxs/prior treatment) HPI Comments: Patient is autistic and his HPI and ROS is limited by this.  He is here with his mother and caregiver from the group home who report that they noticed starting 2 days ago that his right foot, ankle and now lower leg were red and swollen.  He complains of pain to the area.  They report no known injury to the area, and were seen at [facility] yesterday where x-rays were taken and were negative - they state that the area has now increased slightly so they decided to come here.  They report no known fever or chills, state that he is at his baseline mental status wise, is eating and drinking well.  Patient is a 35 y.o. male presenting with leg pain. The history is provided by a caregiver and a parent. No language interpreter was used.  Leg Pain  The incident occurred 2 days ago. The incident occurred at home. There was no injury mechanism. The pain is present in the right leg. Quality: unable to quantify. Pertinent negatives include no numbness, no inability to bear weight, no loss of motion and no muscle weakness. He reports no foreign bodies present. Nothing aggravates the symptoms.    Past Medical History  Diagnosis Date  . Autism   . Impulse control disorder   . Leukemia   . Folliculitis   . TMJ (dislocation of temporomandibular joint)   . Hypertension     Past Surgical History  Procedure Date  . Ankle surgery     l/ankle fracture 5 years ago    No family history on file.  History  Substance Use Topics  . Smoking status: Never Smoker   . Smokeless tobacco: Not on file  . Alcohol Use: No      Review of Systems  Unable to  perform ROS: Other  Constitutional:       Mental retardation  Neurological: Negative for numbness.    Allergies  Review of patient's allergies indicates no known allergies.  Home Medications   Current Outpatient Rx  Name Route Sig Dispense Refill  . CARBAMAZEPINE ER (ANTIPSYCH) 200 MG PO CP12 Oral Take 200 mg by mouth 2 (two) times daily.      Marland Kitchen VITAMIN D3 1000 UNITS PO CAPS Oral Take 1 capsule by mouth.    Marland Kitchen DIVALPROEX SODIUM ER 500 MG PO TB24 Oral Take 2,500 mg by mouth daily.      Marland Kitchen HALOPERIDOL 1 MG PO TABS Oral Take 1 mg by mouth 2 (two) times daily.    Marland Kitchen HYDROCHLOROTHIAZIDE 25 MG PO TABS Oral Take 25 mg by mouth daily.      . IMATINIB MESYLATE 100 MG PO TABS Oral Take 100 mg by mouth daily. Take with meals and large glass of water.Caution:Chemotherapy     . LEVOTHYROXINE SODIUM 300 MCG PO TABS Oral Take 300 mcg by mouth daily.      Marland Kitchen LIOTHYRONINE SODIUM 25 MCG PO TABS Oral Take 25 mcg by mouth daily.      Marland Kitchen METOPROLOL SUCCINATE ER 50 MG PO TB24 Oral Take 50 mg by mouth daily.      Marland Kitchen MINOCYCLINE  HCL 100 MG PO CAPS Oral Take 100 mg by mouth 2 (two) times daily.      Marland Kitchen MUPIROCIN 2 % EX OINT Topical Apply topically 3 (three) times daily.      Marland Kitchen NAPROXEN 500 MG PO TABS Oral Take 500 mg by mouth 2 (two) times daily with a meal.      . QUETIAPINE FUMARATE ER 50 MG PO TB24 Oral Take 200 mg by mouth.      . TRAMADOL HCL 50 MG PO TABS Oral Take 50 mg by mouth every 6 (six) hours as needed. Maximum dose= 8 tablets per day     . TRIAZOLAM 0.25 MG PO TABS Oral Take 0.25 mg by mouth at bedtime as needed.      Marland Kitchen ZONISAMIDE 100 MG PO CAPS Oral Take 300 mg by mouth daily.        BP 128/96  Pulse 68  Temp 97.6 F (36.4 C) (Oral)  Resp 18  SpO2 100%  Physical Exam  Nursing note and vitals reviewed. Constitutional: He is oriented to person, place, and time. He appears well-developed and well-nourished. No distress.  HENT:  Head: Normocephalic and atraumatic.  Right Ear: External ear  normal.  Left Ear: External ear normal.  Nose: Nose normal.  Mouth/Throat: Oropharynx is clear and moist. No oropharyngeal exudate.  Eyes: Conjunctivae are normal. Pupils are equal, round, and reactive to light. No scleral icterus.  Neck: Normal range of motion. Neck supple.  Cardiovascular: Normal rate, regular rhythm and normal heart sounds.  Exam reveals no gallop and no friction rub.   No murmur heard. Pulmonary/Chest: Effort normal and breath sounds normal. No respiratory distress. He has no wheezes. He has no rales. He exhibits no tenderness.  Abdominal: Soft. Bowel sounds are normal. He exhibits no distension. There is no tenderness.  Musculoskeletal: Normal range of motion. He exhibits edema and tenderness.       Skin with erythema and peau de orange appearance, 2+ DP and PT pulses, full ROM of ankle and toes, erythema extends to mid calf.  Lymphadenopathy:    He has no cervical adenopathy.  Neurological: He is alert and oriented to person, place, and time. No cranial nerve deficit.  Skin: Skin is warm and dry. No rash noted. There is erythema. No pallor.  Psychiatric: He has a normal mood and affect. His behavior is normal. Judgment and thought content normal.    ED Course  Procedures (including critical care time)  Labs Reviewed  CBC WITH DIFFERENTIAL - Abnormal; Notable for the following:    RBC 3.51 (*)     Hemoglobin 11.4 (*)     HCT 32.0 (*)     Platelets 132 (*)     Monocytes Relative 20 (*)     Monocytes Absolute 1.3 (*)     All other components within normal limits  BASIC METABOLIC PANEL - Abnormal; Notable for the following:    Sodium 124 (*)     Potassium 3.4 (*)     Chloride 88 (*)     All other components within normal limits  URIC ACID - Abnormal; Notable for the following:    Uric Acid, Serum 2.1 (*)     All other components within normal limits  SEDIMENTATION RATE - Abnormal; Notable for the following:    Sed Rate 17 (*)     All other components within  normal limits  C-REACTIVE PROTEIN   No results found.   Cellulitis   MDM  Patient  with negative doppler, doubt septic arthrits as there is full ROM and minimal pain to palpation of the ankle - given IV abx here and will place on oral and pain control - discharge home with follow up with PCP.        Izola Price [place], [place] 09/02/11 1933

## 2011-09-02 NOTE — Progress Notes (Signed)
VASCULAR LAB PRELIMINARY  PRELIMINARY  PRELIMINARY  PRELIMINARY  Right lower extremity venous Doppler completed.    Preliminary report:  There is no DVT or SVT noted in the right lower extremity.  Janye Maynor, 09/02/2011, 5:38 PM

## 2011-09-02 NOTE — ED Notes (Signed)
Vascular tech at bedside. °

## 2011-09-02 NOTE — ED Notes (Signed)
Family at bedside. - Mother Special needs adult from group home caregiver at bedside-stated that pt c/o severe pain in r/leg over last 2 days. Tenderness in foot and leg on palpation

## 2011-09-03 NOTE — ED Provider Notes (Signed)
Medical screening examination/treatment/procedure(s) were conducted as a shared visit with non-physician practitioner(s) and myself.  I personally evaluated the patient during the encounter  Hurman Horn, MD 09/03/11 1241

## 2011-10-11 ENCOUNTER — Telehealth: Payer: Self-pay | Admitting: Oncology

## 2011-10-11 ENCOUNTER — Ambulatory Visit (HOSPITAL_BASED_OUTPATIENT_CLINIC_OR_DEPARTMENT_OTHER): Payer: Medicaid Other | Admitting: Oncology

## 2011-10-11 ENCOUNTER — Other Ambulatory Visit (HOSPITAL_BASED_OUTPATIENT_CLINIC_OR_DEPARTMENT_OTHER): Payer: Medicaid Other | Admitting: Lab

## 2011-10-11 VITALS — BP 122/87 | HR 79 | Temp 96.7°F | Resp 20 | Ht 72.0 in | Wt 248.4 lb

## 2011-10-11 DIAGNOSIS — D649 Anemia, unspecified: Secondary | ICD-10-CM

## 2011-10-11 DIAGNOSIS — C9211 Chronic myeloid leukemia, BCR/ABL-positive, in remission: Secondary | ICD-10-CM

## 2011-10-11 DIAGNOSIS — D72819 Decreased white blood cell count, unspecified: Secondary | ICD-10-CM

## 2011-10-11 DIAGNOSIS — C9210 Chronic myeloid leukemia, BCR/ABL-positive, not having achieved remission: Secondary | ICD-10-CM

## 2011-10-11 LAB — COMPREHENSIVE METABOLIC PANEL
Albumin: 4.2 g/dL (ref 3.5–5.2)
Alkaline Phosphatase: 68 U/L (ref 39–117)
CO2: 26 mEq/L (ref 19–32)
Glucose, Bld: 119 mg/dL — ABNORMAL HIGH (ref 70–99)
Potassium: 3.9 mEq/L (ref 3.5–5.3)
Sodium: 131 mEq/L — ABNORMAL LOW (ref 135–145)
Total Protein: 6.8 g/dL (ref 6.0–8.3)

## 2011-10-11 LAB — CBC WITH DIFFERENTIAL/PLATELET
Eosinophils Absolute: 0.1 10*3/uL (ref 0.0–0.5)
MONO#: 0.3 10*3/uL (ref 0.1–0.9)
NEUT#: 1.5 10*3/uL (ref 1.5–6.5)
RBC: 3.6 10*6/uL — ABNORMAL LOW (ref 4.20–5.82)
RDW: 14.1 % (ref 11.0–14.6)
WBC: 3.3 10*3/uL — ABNORMAL LOW (ref 4.0–10.3)
lymph#: 1.4 10*3/uL (ref 0.9–3.3)

## 2011-10-11 NOTE — Telephone Encounter (Signed)
Gave pt appt calendar for February 2014 lab and MD °

## 2011-10-11 NOTE — Progress Notes (Signed)
Hematology and Oncology Follow Up Visit  Darrell Russo [id number] Dec 16, 1975 36 y.o. 10/11/2011 10:38 AM  CC: Darrell Gins, MD    Principle Diagnosis: This is a 36 year old gentleman with chronic phase CML diagnosed in 2007, presented with leukocytosis and BCR-ABL positive peripheral blood.  Current therapy: He is on Gleevec 400 mg total dose.  He has a hematological and molecular remission since May 2007.  Interim History: Darrell Russo presents today for a followup visit accompanied by his mother and reporting really no major problems since the last time I saw him.  He had not reported any hospitalizations or illnesses.  Had not reported any toxicity associated with the Gleevec.  His performance status and activity level remains at the same level at this point.  He is not reporting any toxicity associated with the Gleevec.  He does not report any fatigue.  He does not report any bleeding.  Overall activity level remain stable. He still resides in a group home but spends a lot time with his mother and brothers. He was diagnosed with cellulitis of his right leg last month and have resolved at this time.  Medications: I have reviewed the patient's current medications. Current outpatient prescriptions:carbamazepine (ANTIPSYCHOTIC - EQUETRO) 200 MG CP12, Take 200 mg by mouth 2 (two) times daily.  , Disp: , Rfl: ;  Cholecalciferol (VITAMIN D3) 1000 UNITS CAPS, Take 1 capsule by mouth., Disp: , Rfl: ;  divalproex (DEPAKOTE ER) 500 MG 24 hr tablet, Take 2,500 mg by mouth daily.  , Disp: , Rfl: ;  haloperidol (HALDOL) 1 MG tablet, Take 1.5 mg by mouth 2 (two) times daily. , Disp: , Rfl:  hydrochlorothiazide (HYDRODIURIL) 25 MG tablet, Take 25 mg by mouth daily.  , Disp: , Rfl: ;  imatinib (GLEEVEC) 100 MG tablet, Take 100 mg by mouth daily. Take with meals and large glass of water.Caution:Chemotherapy , Disp: , Rfl: ;  levothyroxine (LEVOTHROID) 300 MCG tablet, Take 300 mcg by mouth daily.  , Disp: , Rfl: ;   liothyronine (CYTOMEL) 25 MCG tablet, Take 25 mcg by mouth daily.  , Disp: , Rfl:  metoprolol (TOPROL-XL) 50 MG 24 hr tablet, Take 50 mg by mouth daily.  , Disp: , Rfl: ;  minocycline (MINOCIN,DYNACIN) 100 MG capsule, Take 100 mg by mouth 2 (two) times daily.  , Disp: , Rfl: ;  mupirocin (BACTROBAN) 2 % ointment, Apply topically 3 (three) times daily.  , Disp: , Rfl: ;  naproxen (NAPROSYN) 500 MG tablet, Take 500 mg by mouth 2 (two) times daily with a meal.  , Disp: , Rfl:  QUEtiapine (SEROQUEL XR) 50 MG TB24, Take 200 mg by mouth.  , Disp: , Rfl: ;  traMADol (ULTRAM) 50 MG tablet, Take 50 mg by mouth every 6 (six) hours as needed. Maximum dose= 8 tablets per day , Disp: , Rfl: ;  triazolam (HALCION) 0.25 MG tablet, Take 0.25 mg by mouth at bedtime as needed.  , Disp: , Rfl: ;  zonisamide (ZONEGRAN) 100 MG capsule, Take 300 mg by mouth daily.  , Disp: , Rfl:   Allergies: No Known Allergies  Past Medical History, Surgical history, Social history, and Family History were reviewed and updated.  Review of Systems: Constitutional:  Negative for fever, chills, night sweats, anorexia, weight loss, pain. Cardiovascular: no chest pain or dyspnea on exertion Respiratory: no cough, shortness of breath, or wheezing Neurological: no TIA or stroke symptoms Dermatological: negative ENT: negative Skin: Negative. Gastrointestinal: no abdominal pain, change in  bowel habits, or black or bloody stools Genito-Urinary: negative Hematological and Lymphatic: negative Breast: negative Musculoskeletal: negative Remaining ROS negative. Physical Exam: Blood pressure 122/87, pulse 79, temperature 96.7 F (35.9 C), temperature source Oral, resp. rate 20, height 6' (1.829 m), weight 248 lb 6.4 oz (112.674 kg). ECOG: 1 General appearance: alert Head: Normocephalic, without obvious abnormality, atraumatic Neck: no adenopathy, no carotid bruit, no JVD, supple, symmetrical, trachea midline and thyroid not enlarged,  symmetric, no tenderness/mass/nodules Lymph nodes: Cervical, supraclavicular, and axillary nodes normal. Heart:regular rate and rhythm, S1, S2 normal, no murmur, click, rub or gallop Lung:chest clear, no wheezing, rales, normal symmetric air entry Abdomin: soft, non-tender, without masses or organomegaly EXT:no erythema, induration, or nodules   Lab Results: Lab Results  Component Value Date   WBC 3.3* 10/11/2011   HGB 11.8* 10/11/2011   HCT 34.5* 10/11/2011   MCV 95.8 10/11/2011   PLT 157 10/11/2011     Chemistry      Component Value Date/Time   NA 124* 09/02/2011 1615   K 3.4* 09/02/2011 1615   CL 88* 09/02/2011 1615   CO2 27 09/02/2011 1615   BUN 9 09/02/2011 1615   CREATININE 0.82 09/02/2011 1615      Component Value Date/Time   CALCIUM 9.7 09/02/2011 1615   ALKPHOS 83 05/11/2011 1306   AST 55* 05/11/2011 1306   ALT 48 05/11/2011 1306   BILITOT 0.4 05/11/2011 1306       Impression and Plan:  This is a 36 year old gentleman with the following issues. 1. Chronic phase CML.  He is on Gleevec since May 2007.  He continues to be with complete hematological and molecular remissio.  His liver function tests have been done routinely without any abnormalities.  At this time being will continue Darrell Russo on [employer] given his excellent response. His last BCR-ABL testing in 08/2010 shows continued complete molecular remission. This will be repeated yearly.  2. Mild anemia and leukopenia.  This is undoubtedly dilated Gleevec effect, but not enough to suggest or recommend changes in the Gleevec dose.  [facility], MD 8/14/201310:38 AM

## 2011-10-17 ENCOUNTER — Emergency Department (HOSPITAL_COMMUNITY)
Admission: EM | Admit: 2011-10-17 | Discharge: 2011-10-17 | Disposition: A | Discharge status: Home / Self Care | Payer: Medicaid Other | Source: Home / Self Care | Attending: Family Medicine | Admitting: Family Medicine

## 2011-10-17 ENCOUNTER — Encounter (HOSPITAL_COMMUNITY): Payer: Self-pay | Admitting: *Deleted

## 2011-10-17 DIAGNOSIS — R269 Unspecified abnormalities of gait and mobility: Secondary | ICD-10-CM

## 2011-10-17 LAB — POCT I-STAT, CHEM 8
Creatinine, Ser: 1.1 mg/dL (ref 0.50–1.35)
Hemoglobin: 13.9 g/dL (ref 13.0–17.0)
Sodium: 132 mEq/L — ABNORMAL LOW (ref 135–145)
TCO2: 25 mmol/L (ref 0–100)

## 2011-10-17 LAB — VALPROIC ACID LEVEL: Valproic Acid Lvl: 97.3 ug/mL (ref 50.0–100.0)

## 2011-10-17 NOTE — ED Notes (Signed)
Pt      Reports       Symptoms  Of  dizzyness   With      Episode  This  Am  Where  He  Almost  Felled  But  Did  Not        His  behaviour  Otherwise  Is   Baseline  According to  Staff       No  Apparent injury

## 2011-10-17 NOTE — ED Provider Notes (Signed)
History     CSN: [id number]  Arrival date & time 10/17/11  1145   First MD Initiated Contact with Patient 10/17/11 1156      Chief Complaint  Patient presents with  . Dizziness    (Consider location/radiation/quality/duration/timing/severity/associated sxs/prior treatment) Patient is a 36 y.o. male presenting with neurologic complaint. The history is provided by the patient and a caregiver.  Neurologic Problem The primary symptoms include dizziness. Primary symptoms do not include headaches, syncope or loss of consciousness. The symptoms began 2 to 6 hours ago. The symptoms are improving. The symptoms occurred after standing up (noticed to have unsteady gait briefly this am, no other complaints, gait resolved to nl.).  Dizziness does not occur with weakness.  Additional symptoms do not include weakness.    Past Medical History  Diagnosis Date  . Autism   . Impulse control disorder   . Leukemia   . Folliculitis   . TMJ (dislocation of temporomandibular joint)   . Hypertension     Past Surgical History  Procedure Date  . Ankle surgery     l/ankle fracture 5 years ago    No family history on file.  History  Substance Use Topics  . Smoking status: Never Smoker   . Smokeless tobacco: Not on file  . Alcohol Use: No      Review of Systems  Constitutional: Negative.   Cardiovascular: Negative for syncope.  Musculoskeletal: Negative.   Neurological: Positive for dizziness. Negative for loss of consciousness, weakness and headaches.    Allergies  Review of patient's allergies indicates no known allergies.  Home Medications   Current Outpatient Rx  Name Route Sig Dispense Refill  . CARBAMAZEPINE ER (ANTIPSYCH) 200 MG PO CP12 Oral Take 200 mg by mouth 2 (two) times daily.      Marland Kitchen VITAMIN D3 1000 UNITS PO CAPS Oral Take 1 capsule by mouth.    Marland Kitchen DIVALPROEX SODIUM ER 500 MG PO TB24 Oral Take 2,500 mg by mouth daily.      Marland Kitchen HALOPERIDOL 1 MG PO TABS Oral Take 1.5 mg by  mouth 2 (two) times daily.     Marland Kitchen HYDROCHLOROTHIAZIDE 25 MG PO TABS Oral Take 25 mg by mouth daily.      . IMATINIB MESYLATE 100 MG PO TABS Oral Take 100 mg by mouth daily. Take with meals and large glass of water.Caution:Chemotherapy     . LEVOTHYROXINE SODIUM 300 MCG PO TABS Oral Take 300 mcg by mouth daily.      Marland Kitchen LIOTHYRONINE SODIUM 25 MCG PO TABS Oral Take 25 mcg by mouth daily.      Marland Kitchen METOPROLOL SUCCINATE ER 50 MG PO TB24 Oral Take 50 mg by mouth daily.      Marland Kitchen MINOCYCLINE HCL 100 MG PO CAPS Oral Take 100 mg by mouth 2 (two) times daily.      Marland Kitchen MUPIROCIN 2 % EX OINT Topical Apply topically 3 (three) times daily.      Marland Kitchen NAPROXEN 500 MG PO TABS Oral Take 500 mg by mouth 2 (two) times daily with a meal.      . QUETIAPINE FUMARATE ER 50 MG PO TB24 Oral Take 200 mg by mouth.      . TRAMADOL HCL 50 MG PO TABS Oral Take 50 mg by mouth every 6 (six) hours as needed. Maximum dose= 8 tablets per day     . TRIAZOLAM 0.25 MG PO TABS Oral Take 0.25 mg by mouth at bedtime as needed.      Marland Kitchen  ZONISAMIDE 100 MG PO CAPS Oral Take 300 mg by mouth daily.        BP 120/67  Pulse 64  Temp 97.4 F (36.3 C) (Oral)  Resp 16  SpO2 100%  Physical Exam  Nursing note and vitals reviewed. Constitutional: He appears well-nourished. No distress.  HENT:  Head: Normocephalic.  Right Ear: External ear normal.  Left Ear: External ear normal.  Mouth/Throat: Oropharynx is clear and moist.  Eyes: EOM are normal. Pupils are equal, round, and reactive to light.  Neck: Normal range of motion. Neck supple.  Cardiovascular: Normal rate.   Abdominal: There is no tenderness.  Musculoskeletal: He exhibits no tenderness.  Lymphadenopathy:    He has no cervical adenopathy.  Neurological: He is alert.       Ambulatory without assistance, at baseline according to staff..  Skin: Skin is warm and dry.    ED Course  Procedures (including critical care time)  Labs Reviewed  POCT I-STAT, CHEM 8 - Abnormal; Notable for the  following:    Sodium 132 (*)     All other components within normal limits  VALPROIC ACID LEVEL   No results found.   1. Gait disturbance       MDM          Linna Hoff, MD 10/17/11 1249

## 2012-04-12 ENCOUNTER — Ambulatory Visit: Payer: Medicaid Other | Admitting: Oncology

## 2012-04-12 ENCOUNTER — Other Ambulatory Visit: Payer: Medicaid Other | Admitting: Lab

## 2012-04-12 ENCOUNTER — Telehealth: Payer: Self-pay | Admitting: Oncology

## 2012-04-12 NOTE — Telephone Encounter (Signed)
returned pt called and r/s appt to April...pt ok and aware

## 2012-05-28 ENCOUNTER — Telehealth: Payer: Self-pay | Admitting: Oncology

## 2012-05-28 ENCOUNTER — Ambulatory Visit (HOSPITAL_BASED_OUTPATIENT_CLINIC_OR_DEPARTMENT_OTHER): Payer: Medicaid Other | Admitting: Oncology

## 2012-05-28 ENCOUNTER — Other Ambulatory Visit (HOSPITAL_BASED_OUTPATIENT_CLINIC_OR_DEPARTMENT_OTHER): Payer: Medicaid Other | Admitting: Lab

## 2012-05-28 VITALS — BP 131/86 | HR 89 | Temp 97.1°F | Resp 18 | Ht 72.0 in | Wt 254.6 lb

## 2012-05-28 DIAGNOSIS — D649 Anemia, unspecified: Secondary | ICD-10-CM

## 2012-05-28 DIAGNOSIS — C9211 Chronic myeloid leukemia, BCR/ABL-positive, in remission: Secondary | ICD-10-CM

## 2012-05-28 DIAGNOSIS — D72819 Decreased white blood cell count, unspecified: Secondary | ICD-10-CM

## 2012-05-28 DIAGNOSIS — C9210 Chronic myeloid leukemia, BCR/ABL-positive, not having achieved remission: Secondary | ICD-10-CM

## 2012-05-28 LAB — COMPREHENSIVE METABOLIC PANEL (CC13)
ALT: 39 U/L (ref 0–55)
AST: 45 U/L — ABNORMAL HIGH (ref 5–34)
Albumin: 4.3 g/dL (ref 3.5–5.0)
Alkaline Phosphatase: 75 U/L (ref 40–150)
Glucose: 81 mg/dl (ref 70–99)
Potassium: 4 mEq/L (ref 3.5–5.1)
Sodium: 136 mEq/L (ref 136–145)
Total Protein: 7.7 g/dL (ref 6.4–8.3)

## 2012-05-28 LAB — CBC WITH DIFFERENTIAL/PLATELET
BASO%: 0.2 % (ref 0.0–2.0)
EOS%: 2.3 % (ref 0.0–7.0)
Eosinophils Absolute: 0.1 10*3/uL (ref 0.0–0.5)
MCHC: 33.3 g/dL (ref 32.0–36.0)
MCV: 95.9 fL (ref 79.3–98.0)
MONO%: 14.6 % — ABNORMAL HIGH (ref 0.0–14.0)
NEUT#: 1.9 10*3/uL (ref 1.5–6.5)
RBC: 3.67 10*6/uL — ABNORMAL LOW (ref 4.20–5.82)
RDW: 14.1 % (ref 11.0–14.6)

## 2012-05-28 NOTE — Progress Notes (Signed)
Hematology and Oncology Follow Up Visit  Darrell Russo [id number] Nov 08, 1975 37 y.o. 05/28/2012 3:26 PM  CC: Ace Gins, MD    Principle Diagnosis: This is a 38 year old gentleman with chronic phase CML diagnosed in 2007, presented with leukocytosis and BCR-ABL positive peripheral blood.  Current therapy: He is on Gleevec 400 mg total dose.  He has a hematological and molecular remission since May 2007.  Interim History: Castle presents today for a followup visit accompanied by his mother and reporting really no major problems since the last time I saw him.  He had not reported any hospitalizations or illnesses.  Had not reported any toxicity associated with the Gleevec.  His performance status and activity level remains at the same level at this point.  He is not reporting any toxicity associated with the Gleevec.  He does not report any fatigue.  He does not report any bleeding.  Overall activity level remain stable. He still resides in a group home but spends a lot time with his mother.   Medications: I have reviewed the patient's current medications. Current outpatient prescriptions:carbamazepine (ANTIPSYCHOTIC - EQUETRO) 200 MG CP12, Take 200 mg by mouth 2 (two) times daily.  , Disp: , Rfl: ;  Cholecalciferol (VITAMIN D3) 1000 UNITS CAPS, Take 1 capsule by mouth., Disp: , Rfl: ;  divalproex (DEPAKOTE ER) 500 MG 24 hr tablet, Take 2,500 mg by mouth daily.  , Disp: , Rfl: ;  haloperidol (HALDOL) 1 MG tablet, Take 1.5 mg by mouth 2 (two) times daily. , Disp: , Rfl:  hydrochlorothiazide (HYDRODIURIL) 25 MG tablet, Take 25 mg by mouth daily.  , Disp: , Rfl: ;  imatinib (GLEEVEC) 100 MG tablet, Take 100 mg by mouth daily. Take with meals and large glass of water.Caution:Chemotherapy , Disp: , Rfl: ;  levothyroxine (LEVOTHROID) 300 MCG tablet, Take 300 mcg by mouth daily.  , Disp: , Rfl: ;  liothyronine (CYTOMEL) 25 MCG tablet, Take 25 mcg by mouth daily.  , Disp: , Rfl:  metoprolol (TOPROL-XL) 50  MG 24 hr tablet, Take 50 mg by mouth daily.  , Disp: , Rfl: ;  minocycline (MINOCIN,DYNACIN) 100 MG capsule, Take 100 mg by mouth 2 (two) times daily.  , Disp: , Rfl: ;  mupirocin (BACTROBAN) 2 % ointment, Apply topically 3 (three) times daily.  , Disp: , Rfl: ;  naproxen (NAPROSYN) 500 MG tablet, Take 500 mg by mouth 2 (two) times daily with a meal.  , Disp: , Rfl:  QUEtiapine (SEROQUEL XR) 50 MG TB24, Take 200 mg by mouth.  , Disp: , Rfl: ;  traMADol (ULTRAM) 50 MG tablet, Take 50 mg by mouth every 6 (six) hours as needed. Maximum dose= 8 tablets per day , Disp: , Rfl: ;  triazolam (HALCION) 0.25 MG tablet, Take 0.25 mg by mouth at bedtime as needed.  , Disp: , Rfl: ;  zonisamide (ZONEGRAN) 100 MG capsule, Take 300 mg by mouth daily.  , Disp: , Rfl:   Allergies: No Known Allergies  Past Medical History, Surgical history, Social history, and Family History were reviewed and updated.  Review of Systems: Constitutional:  Negative for fever, chills, night sweats, anorexia, weight loss, pain. Cardiovascular: no chest pain or dyspnea on exertion Respiratory: no cough, shortness of breath, or wheezing Neurological: no TIA or stroke symptoms Dermatological: negative ENT: negative Skin: Negative. Gastrointestinal: no abdominal pain, change in bowel habits, or black or bloody stools Genito-Urinary: negative Hematological and Lymphatic: negative Breast: negative Musculoskeletal: negative Remaining  ROS negative. Physical Exam: Blood pressure 131/86, pulse 89, temperature 97.1 F (36.2 C), temperature source Oral, resp. rate 18, height 6' (1.829 m), weight 254 lb 9.6 oz (115.486 kg). ECOG: 1 General appearance: alert Head: Normocephalic, without obvious abnormality, atraumatic Neck: no adenopathy, no carotid bruit, no JVD, supple, symmetrical, trachea midline and thyroid not enlarged, symmetric, no tenderness/mass/nodules Lymph nodes: Cervical, supraclavicular, and axillary nodes  normal. Heart:regular rate and rhythm, S1, S2 normal, no murmur, click, rub or gallop Lung:chest clear, no wheezing, rales, normal symmetric air entry Abdomin: soft, non-tender, without masses or organomegaly EXT:no erythema, induration, or nodules   Lab Results: Lab Results  Component Value Date   WBC 5.0 05/28/2012   HGB 11.7* 05/28/2012   HCT 35.2* 05/28/2012   MCV 95.9 05/28/2012   PLT 140 05/28/2012     Chemistry      Component Value Date/Time   NA 132* 10/17/2011 1244   K 3.9 10/17/2011 1244   CL 96 10/17/2011 1244   CO2 26 10/11/2011 1006   BUN 16 10/17/2011 1244   CREATININE 1.10 10/17/2011 1244      Component Value Date/Time   CALCIUM 9.0 10/11/2011 1006   ALKPHOS 68 10/11/2011 1006   AST 35 10/11/2011 1006   ALT 30 10/11/2011 1006   BILITOT 0.3 10/11/2011 1006       Impression and Plan:  This is a 37 year old gentleman with the following issues. 1. Chronic phase CML.  He is on Gleevec since May 2007.  He continues to be with complete hematological and molecular remissio.  His liver function tests have been done routinely without any abnormalities.  At this time being will continue [name] on [employer] given his excellent response. His last BCR-ABL testing shows continued complete molecular remission. This will be repeated yearly.  2. Mild anemia and leukopenia.  This is undoubtedly dilated Gleevec effect, but not enough to suggest or recommend changes in the Gleevec dose.  [facility], MD 4/1/20143:26 PM

## 2012-11-28 ENCOUNTER — Other Ambulatory Visit (HOSPITAL_BASED_OUTPATIENT_CLINIC_OR_DEPARTMENT_OTHER): Payer: Medicaid Other | Admitting: Lab

## 2012-11-28 ENCOUNTER — Ambulatory Visit (HOSPITAL_BASED_OUTPATIENT_CLINIC_OR_DEPARTMENT_OTHER): Payer: Medicaid Other | Admitting: Oncology

## 2012-11-28 ENCOUNTER — Encounter: Payer: Self-pay | Admitting: *Deleted

## 2012-11-28 ENCOUNTER — Telehealth: Payer: Self-pay | Admitting: Oncology

## 2012-11-28 VITALS — BP 141/85 | HR 75 | Temp 97.1°F | Resp 18 | Ht 72.0 in | Wt 256.4 lb

## 2012-11-28 DIAGNOSIS — C9210 Chronic myeloid leukemia, BCR/ABL-positive, not having achieved remission: Secondary | ICD-10-CM

## 2012-11-28 DIAGNOSIS — D649 Anemia, unspecified: Secondary | ICD-10-CM

## 2012-11-28 LAB — CBC WITH DIFFERENTIAL/PLATELET
EOS%: 1.9 % (ref 0.0–7.0)
LYMPH%: 46.5 % (ref 14.0–49.0)
MCH: 32.2 pg (ref 27.2–33.4)
MCHC: 34.4 g/dL (ref 32.0–36.0)
MCV: 93.6 fL (ref 79.3–98.0)
MONO%: 7 % (ref 0.0–14.0)
Platelets: 108 10*3/uL — ABNORMAL LOW (ref 140–400)
RBC: 3.73 10*6/uL — ABNORMAL LOW (ref 4.20–5.82)
RDW: 13.5 % (ref 11.0–14.6)

## 2012-11-28 LAB — COMPREHENSIVE METABOLIC PANEL (CC13)
AST: 45 U/L — ABNORMAL HIGH (ref 5–34)
Albumin: 4.1 g/dL (ref 3.5–5.0)
Alkaline Phosphatase: 59 U/L (ref 40–150)
Potassium: 3.8 mEq/L (ref 3.5–5.1)
Sodium: 128 mEq/L — ABNORMAL LOW (ref 136–145)
Total Bilirubin: 0.39 mg/dL (ref 0.20–1.20)
Total Protein: 7.1 g/dL (ref 6.4–8.3)

## 2012-11-28 NOTE — Telephone Encounter (Signed)
gv and printed pt appt sched and avs for pt for July 2015

## 2012-11-28 NOTE — Progress Notes (Signed)
Hematology and Oncology Follow Up Visit  DINK CREPS [id number] Jul 10, 1975 37 y.o. 11/28/2012 9:01 AM  CC: Ace Gins, MD    Principle Diagnosis: This is a 37 year old gentleman with chronic phase CML diagnosed in 2007, presented with leukocytosis and BCR-ABL positive peripheral blood.  Current therapy: He is on Gleevec 400 mg total dose.  He has a hematological and molecular remission since May 2007.  Interim History: Vance presents today for a followup visit accompanied by his mother. No major problems reported since the last time I saw him.  He had not reported any hospitalizations or illnesses.  Had not reported any toxicity associated with the Gleevec.  His performance status and activity level remains at the same level at this point.  He does not report any fatigue.  He does not report any bleeding.  Overall activity level remain stable. He still resides in a group home but spends a lot time with his mother.   Medications: I have reviewed the patient's current medications. Current Outpatient Prescriptions  Medication Sig Dispense Refill  . carbamazepine (ANTIPSYCHOTIC - EQUETRO) 200 MG CP12 Take 200 mg by mouth 2 (two) times daily.        . Cholecalciferol (VITAMIN D3) 1000 UNITS CAPS Take 1 capsule by mouth.      . divalproex (DEPAKOTE ER) 500 MG 24 hr tablet Take 2,500 mg by mouth daily.        . haloperidol (HALDOL) 1 MG tablet Take 1.5 mg by mouth 2 (two) times daily.       . hydrochlorothiazide (HYDRODIURIL) 25 MG tablet Take 25 mg by mouth daily.        Marland Kitchen imatinib (GLEEVEC) 100 MG tablet Take 100 mg by mouth daily. Take with meals and large glass of water.Caution:Chemotherapy       . levothyroxine (LEVOTHROID) 300 MCG tablet Take 300 mcg by mouth daily.        Marland Kitchen liothyronine (CYTOMEL) 25 MCG tablet Take 25 mcg by mouth daily.        . metoprolol (TOPROL-XL) 50 MG 24 hr tablet Take 50 mg by mouth daily.        . minocycline (MINOCIN,DYNACIN) 100 MG capsule Take 100 mg by  mouth 2 (two) times daily.        . mupirocin (BACTROBAN) 2 % ointment Apply topically 3 (three) times daily.        . naproxen (NAPROSYN) 500 MG tablet Take 500 mg by mouth 2 (two) times daily with a meal.        . QUEtiapine (SEROQUEL XR) 50 MG TB24 Take 200 mg by mouth.        . traMADol (ULTRAM) 50 MG tablet Take 50 mg by mouth every 6 (six) hours as needed. Maximum dose= 8 tablets per day       . triazolam (HALCION) 0.25 MG tablet Take 0.25 mg by mouth at bedtime as needed.        . zonisamide (ZONEGRAN) 100 MG capsule Take 300 mg by mouth daily.         No current facility-administered medications for this visit.    Allergies: No Known Allergies  Past Medical History, Surgical history, Social history, and Family History were reviewed and updated.  Review of Systems:  Remaining ROS negative. Physical Exam: Blood pressure 141/85, pulse 75, temperature 97.1 F (36.2 C), temperature source Oral, resp. rate 18, height 6' (1.829 m), weight 256 lb 6.4 oz (116.302 kg). ECOG: 1 General appearance: alert Head: Normocephalic,  without obvious abnormality, atraumatic Neck: no adenopathy, no carotid bruit, no JVD, supple, symmetrical, trachea midline and thyroid not enlarged, symmetric, no tenderness/mass/nodules Lymph nodes: Cervical, supraclavicular, and axillary nodes normal. Heart:regular rate and rhythm, S1, S2 normal, no murmur, click, rub or gallop Lung:chest clear, no wheezing, rales, normal symmetric air entry Abdomin: soft, non-tender, without masses or organomegaly EXT:no erythema, induration, or nodules   Lab Results: Lab Results  Component Value Date   WBC 3.3* 11/28/2012   HGB 12.0* 11/28/2012   HCT 34.9* 11/28/2012   MCV 93.6 11/28/2012   PLT 108* 11/28/2012     Chemistry      Component Value Date/Time   NA 136 05/28/2012 1453   NA 132* 10/17/2011 1244   K 4.0 05/28/2012 1453   K 3.9 10/17/2011 1244   CL 98 05/28/2012 1453   CL 96 10/17/2011 1244   CO2 27 05/28/2012 1453    CO2 26 10/11/2011 1006   BUN 17.2 05/28/2012 1453   BUN 16 10/17/2011 1244   CREATININE 1.1 05/28/2012 1453   CREATININE 1.10 10/17/2011 1244      Component Value Date/Time   CALCIUM 9.7 05/28/2012 1453   CALCIUM 9.0 10/11/2011 1006   ALKPHOS 75 05/28/2012 1453   ALKPHOS 68 10/11/2011 1006   AST 45* 05/28/2012 1453   AST 35 10/11/2011 1006   ALT 39 05/28/2012 1453   ALT 30 10/11/2011 1006   BILITOT 0.28 05/28/2012 1453   BILITOT 0.3 10/11/2011 1006       Impression and Plan:  This is a 37 year old gentleman with the following issues. 1. Chronic phase CML.  He is on Gleevec since May 2007.  He continues to be with complete hematological and molecular remission.  His liver function tests have been done routinely without any abnormalities.  At this time being will continue [name] on [employer] given his excellent response. His last BCR-ABL testing shows continued complete molecular remission. This will be repeated yearly.  2. Mild anemia and leukopenia.  This is undoubtedly dilated Gleevec effect, but not enough to suggest or recommend changes in the Gleevec dose. 3. Follow up: in 9 months.   Sammuel Blick, MD 10/2/20149:01 AM

## 2012-11-29 NOTE — Progress Notes (Signed)
[facility] Clinical Social Work  Clinical Social Work was referred by [employer], Charity fundraiser, for questions regarding life Brewing technologist.  CSW met with patient and patient's mother/guardian after medical oncology appointment.  Patient's mother shared she has recently attempted to find a a life insurance policy for her son but has been denied.  CSW discussed this may be due to his cancer diagnosis/medical conditions.  CSW provided patient's mother with contact information for the [facility] for legal advice/information.  CSW also provided patient with CSW information and encouraged her to call with additional questions or support.  Kathrin Penner, MSW, LCSW Clinical Social Worker [facility] [phone]

## 2013-03-13 IMAGING — CR DG ANKLE COMPLETE 3+V*L*
3 series · 3 of 3 positions shown · non-contrast
Comparison: Plain films 05/19/2008

CLINICAL DATA: Fall, right foot pain, bilateral ankle pain

LEFT ANKLE COMPLETE - 3+ VIEW

[t ankle joint ap left]
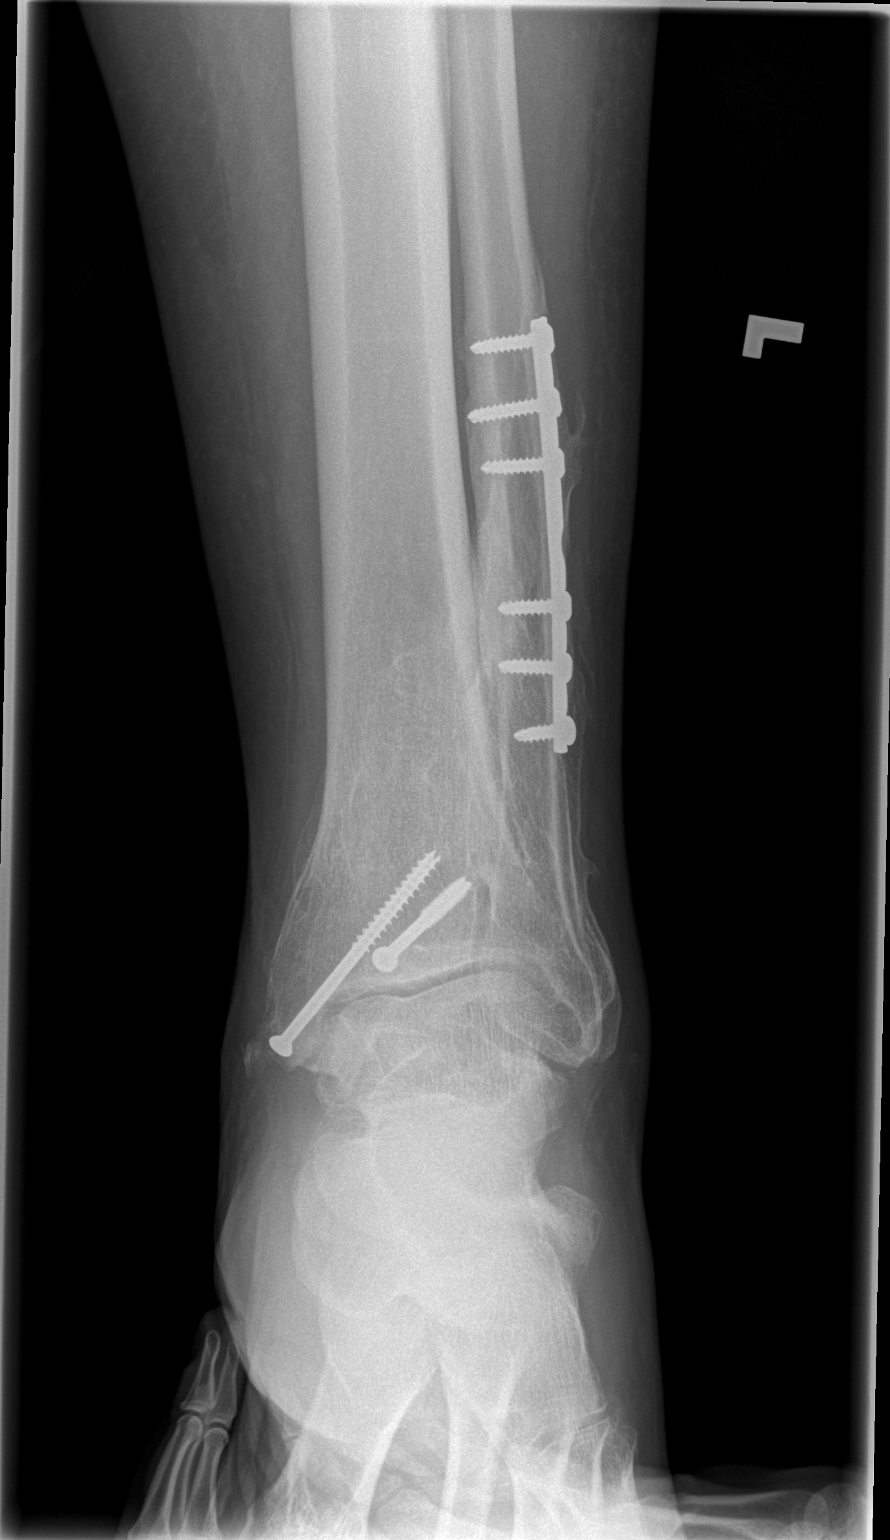

[t ankle joint oblique left]
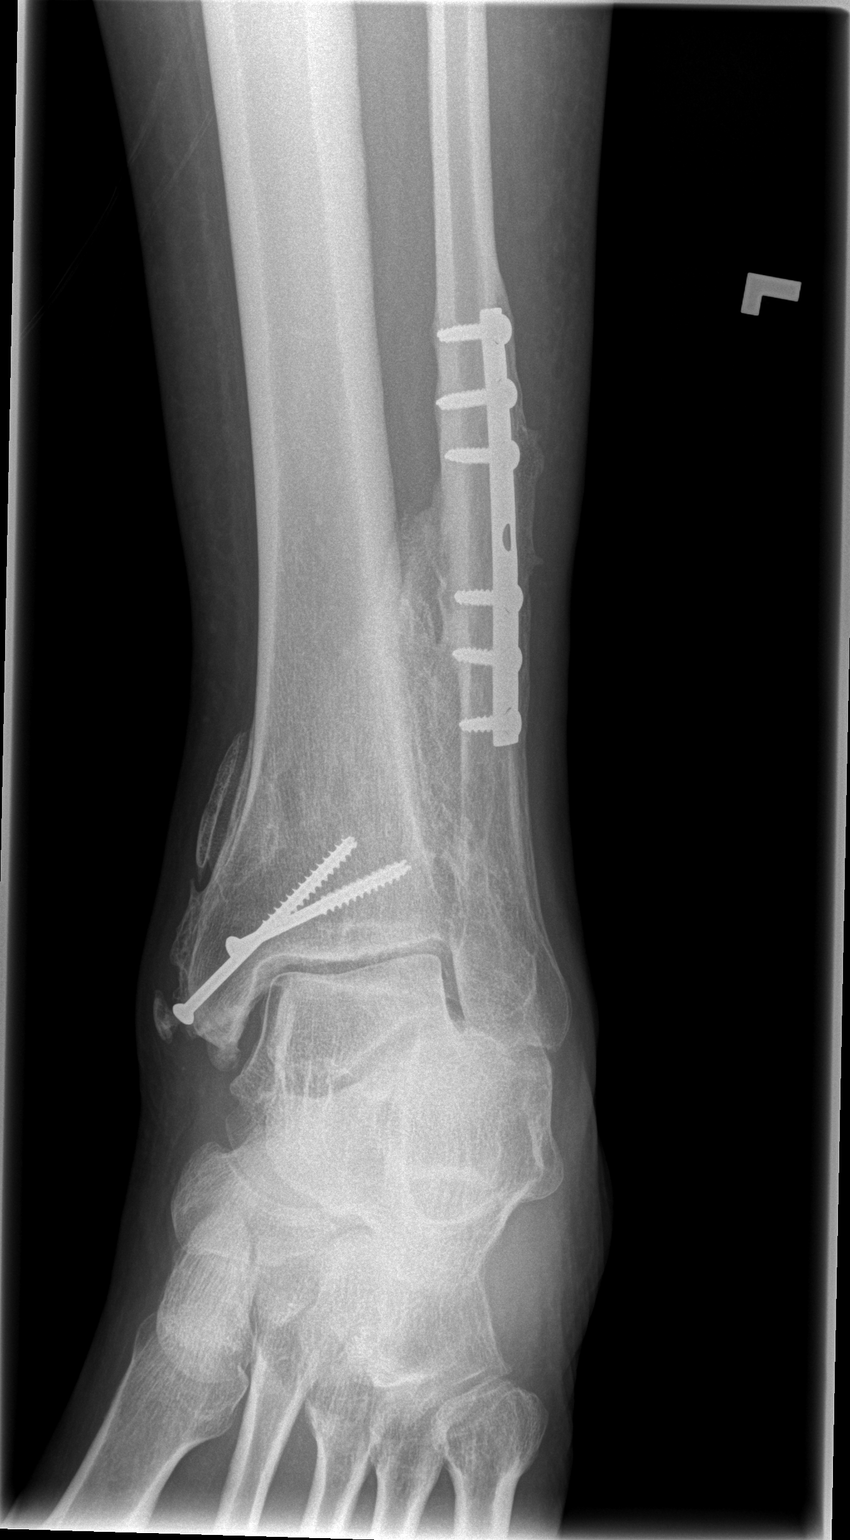

[t ankle joint lat left]
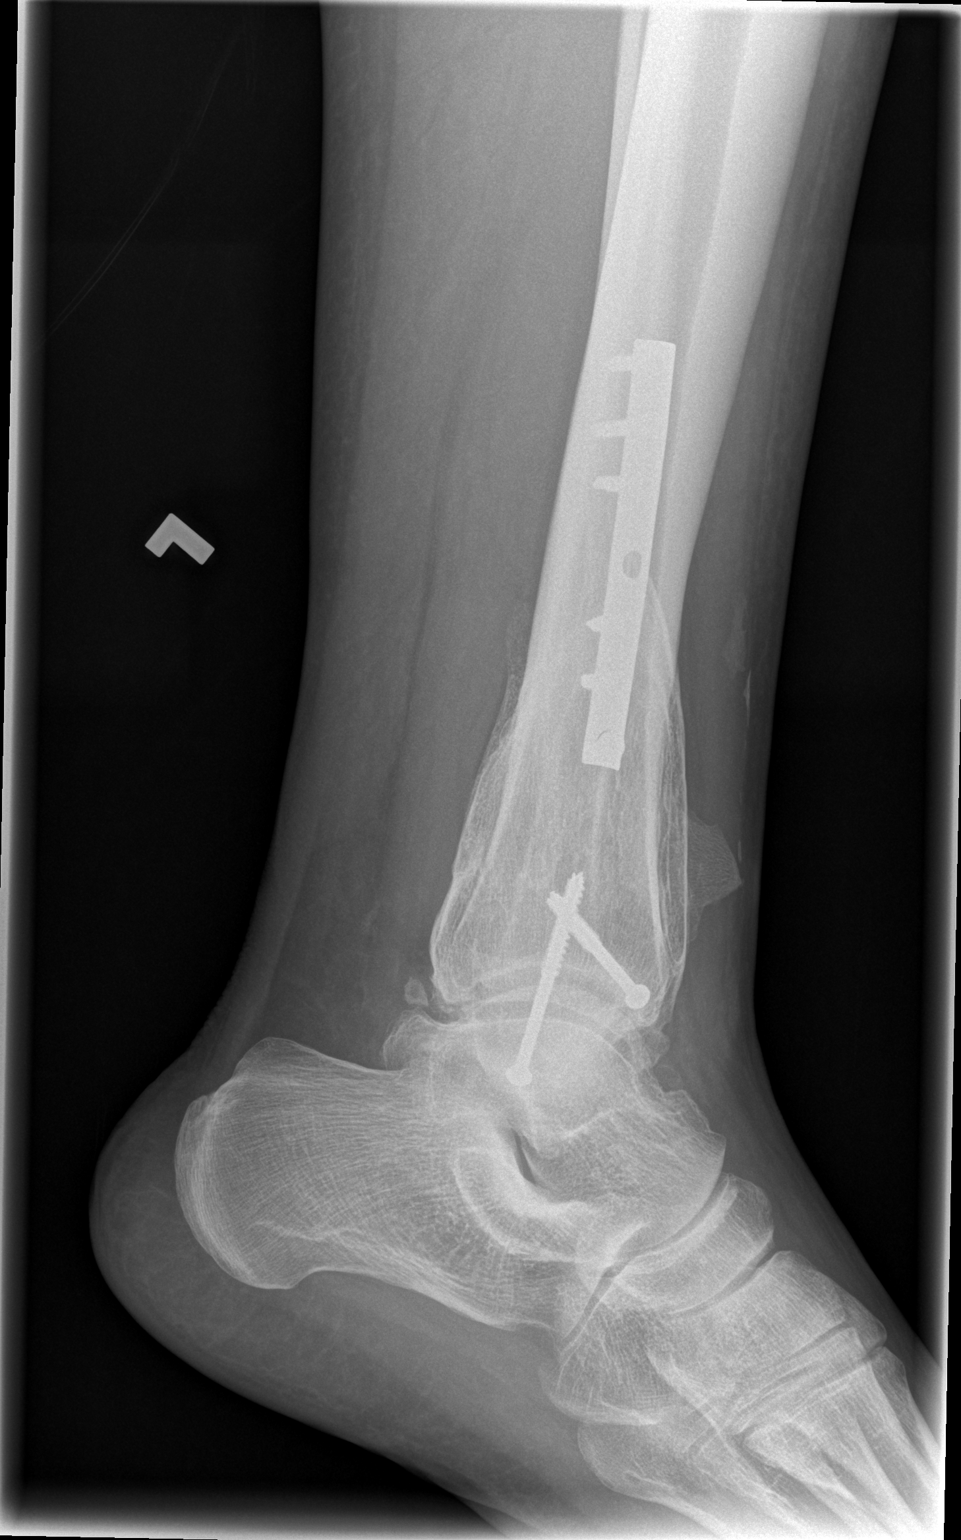

[3 of 3 positions shown; findings below may reference images not displayed]

FINDINGS: There is a internal fixation plate spanning a healed
fibular fracture.  Two cortical screws fixate a medial malleolar
fracture.  Ankle mortise intact.  Talar dome is normal.  There is
some rarefication of the bone of the medial corner of the talar
dome.  Again no evidence of acute fracture.  There is heterotopic
ossification through the tibial fibular syndesmosis.
IMPRESSION: No evidence of acute fracture.

Rarefication of the medial talar dome could represent an
osteochondral defect.

## 2013-08-28 ENCOUNTER — Ambulatory Visit (HOSPITAL_BASED_OUTPATIENT_CLINIC_OR_DEPARTMENT_OTHER): Payer: Medicaid Other | Admitting: Oncology

## 2013-08-28 ENCOUNTER — Telehealth: Payer: Self-pay | Admitting: Oncology

## 2013-08-28 ENCOUNTER — Other Ambulatory Visit: Payer: Medicaid Other

## 2013-08-28 ENCOUNTER — Encounter: Payer: Self-pay | Admitting: Oncology

## 2013-08-28 VITALS — BP 149/82 | HR 97 | Temp 97.7°F | Resp 18 | Ht 72.0 in | Wt 263.6 lb

## 2013-08-28 DIAGNOSIS — C9211 Chronic myeloid leukemia, BCR/ABL-positive, in remission: Secondary | ICD-10-CM

## 2013-08-28 DIAGNOSIS — D72819 Decreased white blood cell count, unspecified: Secondary | ICD-10-CM

## 2013-08-28 DIAGNOSIS — C9210 Chronic myeloid leukemia, BCR/ABL-positive, not having achieved remission: Secondary | ICD-10-CM

## 2013-08-28 DIAGNOSIS — D649 Anemia, unspecified: Secondary | ICD-10-CM

## 2013-08-28 LAB — CBC WITH DIFFERENTIAL/PLATELET
BASO%: 0.4 % (ref 0.0–2.0)
BASOS ABS: 0 10*3/uL (ref 0.0–0.1)
EOS%: 5 % (ref 0.0–7.0)
Eosinophils Absolute: 0.2 10*3/uL (ref 0.0–0.5)
HEMATOCRIT: 34.2 % — AB (ref 38.4–49.9)
HEMOGLOBIN: 11.3 g/dL — AB (ref 13.0–17.1)
LYMPH%: 37.7 % (ref 14.0–49.0)
MCH: 33.6 pg — AB (ref 27.2–33.4)
MCHC: 33.2 g/dL (ref 32.0–36.0)
MCV: 101.2 fL — AB (ref 79.3–98.0)
MONO#: 0.4 10*3/uL (ref 0.1–0.9)
MONO%: 10.1 % (ref 0.0–14.0)
NEUT#: 1.7 10*3/uL (ref 1.5–6.5)
NEUT%: 46.8 % (ref 39.0–75.0)
Platelets: 116 10*3/uL — ABNORMAL LOW (ref 140–400)
RBC: 3.38 10*6/uL — ABNORMAL LOW (ref 4.20–5.82)
RDW: 15.3 % — ABNORMAL HIGH (ref 11.0–14.6)
WBC: 3.7 10*3/uL — ABNORMAL LOW (ref 4.0–10.3)
lymph#: 1.4 10*3/uL (ref 0.9–3.3)

## 2013-08-28 LAB — COMPREHENSIVE METABOLIC PANEL (CC13)
ALT: 37 U/L (ref 0–55)
AST: 46 U/L — AB (ref 5–34)
Albumin: 3.9 g/dL (ref 3.5–5.0)
Alkaline Phosphatase: 71 U/L (ref 40–150)
Anion Gap: 11 mEq/L (ref 3–11)
BILIRUBIN TOTAL: 0.46 mg/dL (ref 0.20–1.20)
BUN: 19 mg/dL (ref 7.0–26.0)
CALCIUM: 9.7 mg/dL (ref 8.4–10.4)
CHLORIDE: 105 meq/L (ref 98–109)
CO2: 25 mEq/L (ref 22–29)
CREATININE: 1.4 mg/dL — AB (ref 0.7–1.3)
Glucose: 107 mg/dl (ref 70–140)
Potassium: 3.3 mEq/L — ABNORMAL LOW (ref 3.5–5.1)
Sodium: 141 mEq/L (ref 136–145)
Total Protein: 7.1 g/dL (ref 6.4–8.3)

## 2013-08-28 NOTE — Telephone Encounter (Signed)
Gave pt appt for lab and MD for March  2015 °

## 2013-08-28 NOTE — Progress Notes (Signed)
Hematology and Oncology Follow Up Visit  Darrell Russo [id number] Nov 18, 1975 38 y.o. 08/28/2013 8:35 AM  CC: [name], MD    Principle Diagnosis: This is a 38 year old gentleman with chronic phase CML diagnosed in 2007, presented with leukocytosis and BCR-ABL positive peripheral blood.  Current therapy: He is on Gleevec 400 mg total dose.  He has a hematological and molecular remission since May 2007.  Interim History: Darrell Russo presents today for a followup visit accompanied by his mother. He continues to reside in a group home but spends a lot of time with his mother. Since his last visit, he has not reported any new issues.  He had not reported any hospitalizations or illnesses.  Had not reported any toxicity associated with the [place].  His performance status and activity level remains at the same level at this point.  He does not report any fatigue.  He does not report any bleeding.  Overall activity level remain stable. He continues to enjoy excellent appetite and has some weight gain since last visit. He does not report any headaches or double vision or seizures. Is not reporting any chest pain or shortness of breath. Is not reporting any abdominal pain or constipation. Does not report any urinary symptoms. Rest of his review of systems unremarkable.   Medications: I have reviewed the patient's current medications. Current Outpatient Prescriptions  Medication Sig Dispense Refill  . carbamazepine (ANTIPSYCHOTIC - EQUETRO) 200 MG CP12 Take 200 mg by mouth 2 (two) times daily.        . Cholecalciferol (VITAMIN D3) 1000 UNITS CAPS Take 1 capsule by mouth.      . divalproex (DEPAKOTE ER) 500 MG 24 hr tablet Take 2,500 mg by mouth daily.        . haloperidol (HALDOL) 1 MG tablet Take 1.5 mg by mouth 2 (two) times daily.       . hydrochlorothiazide (HYDRODIURIL) 25 MG tablet Take 25 mg by mouth daily.        Marland Kitchen imatinib (GLEEVEC) 100 MG tablet Take 100 mg by mouth daily. Take with meals and  large glass of water.Caution:Chemotherapy       . levothyroxine (LEVOTHROID) 300 MCG tablet Take 300 mcg by mouth daily.        Marland Kitchen liothyronine (CYTOMEL) 25 MCG tablet Take 25 mcg by mouth daily.        . metoprolol (TOPROL-XL) 50 MG 24 hr tablet Take 50 mg by mouth daily.        . minocycline (MINOCIN,DYNACIN) 100 MG capsule Take 100 mg by mouth 2 (two) times daily.        . mupirocin (BACTROBAN) 2 % ointment Apply topically 3 (three) times daily.        . naproxen (NAPROSYN) 500 MG tablet Take 500 mg by mouth 2 (two) times daily with a meal.        . QUEtiapine (SEROQUEL XR) 50 MG TB24 Take 200 mg by mouth.        . traMADol (ULTRAM) 50 MG tablet Take 50 mg by mouth every 6 (six) hours as needed. Maximum dose= 8 tablets per day       . triazolam (HALCION) 0.25 MG tablet Take 0.25 mg by mouth at bedtime as needed.        . zonisamide (ZONEGRAN) 100 MG capsule Take 300 mg by mouth daily.         No current facility-administered medications for this visit.    Allergies: No Known Allergies  Past Medical History, Surgical history, Social history, and Family History were reviewed and updated.   Physical Exam: Blood pressure 149/82, pulse 97, temperature 97.7 F (36.5 C), temperature source Oral, resp. rate 18, height 6' (1.829 m), weight 263 lb 9.6 oz (119.568 kg). ECOG: 1 General appearance: alert Head: Normocephalic, without obvious abnormality, atraumatic Neck: no adenopathy Lymph nodes: Cervical, supraclavicular, and axillary nodes normal. Heart:regular rate and rhythm, S1, S2 normal, no murmur, click, rub or gallop Lung:chest clear, no wheezing, rales, normal symmetric air entry Abdomin: soft, non-tender, without masses or organomegaly EXT:no erythema, induration, or nodules   Lab Results: Lab Results  Component Value Date   WBC 3.7* 08/28/2013   HGB 11.3* 08/28/2013   HCT 34.2* 08/28/2013   MCV 101.2* 08/28/2013   PLT 116* 08/28/2013     Chemistry      Component Value Date/Time    NA 128* 11/28/2012 0834   NA 132* 10/17/2011 1244   K 3.8 11/28/2012 0834   K 3.9 10/17/2011 1244   CL 98 05/28/2012 1453   CL 96 10/17/2011 1244   CO2 25 11/28/2012 0834   CO2 26 10/11/2011 1006   BUN 15.0 11/28/2012 0834   BUN 16 10/17/2011 1244   CREATININE 1.1 11/28/2012 0834   CREATININE 1.10 10/17/2011 1244      Component Value Date/Time   CALCIUM 9.4 11/28/2012 0834   CALCIUM 9.0 10/11/2011 1006   ALKPHOS 59 11/28/2012 0834   ALKPHOS 68 10/11/2011 1006   AST 45* 11/28/2012 0834   AST 35 10/11/2011 1006   ALT 40 11/28/2012 0834   ALT 30 10/11/2011 1006   BILITOT 0.39 11/28/2012 0834   BILITOT 0.3 10/11/2011 1006       Impression and Plan:  This is a 38 year old gentleman with the following issues. 1. Chronic phase CML.  He is on [place] since May 2007.  He continues to be with complete hematological and molecular remission.  His liver function tests have been done routinely without any abnormalities.  The plan is to continue the current dose and schedule without any changes.  2. Mild anemia and leukopenia.  This is undoubtedly dilated Gleevec effect, but not enough to suggest or recommend changes in the Gleevec dose. 3. Follow up: in 9 months.   [facility], MD 7/2/20158:35 AM

## 2014-05-15 ENCOUNTER — Telehealth: Payer: Self-pay | Admitting: Oncology

## 2014-05-15 ENCOUNTER — Other Ambulatory Visit (HOSPITAL_BASED_OUTPATIENT_CLINIC_OR_DEPARTMENT_OTHER): Payer: Medicaid Other

## 2014-05-15 ENCOUNTER — Ambulatory Visit (HOSPITAL_BASED_OUTPATIENT_CLINIC_OR_DEPARTMENT_OTHER): Payer: Medicaid Other | Admitting: Oncology

## 2014-05-15 VITALS — BP 133/75 | HR 93 | Temp 98.0°F | Resp 18 | Ht 72.0 in | Wt 267.9 lb

## 2014-05-15 DIAGNOSIS — C9291 Myeloid leukemia, unspecified in remission: Secondary | ICD-10-CM

## 2014-05-15 DIAGNOSIS — D649 Anemia, unspecified: Secondary | ICD-10-CM

## 2014-05-15 DIAGNOSIS — C9210 Chronic myeloid leukemia, BCR/ABL-positive, not having achieved remission: Secondary | ICD-10-CM

## 2014-05-15 DIAGNOSIS — D72819 Decreased white blood cell count, unspecified: Secondary | ICD-10-CM

## 2014-05-15 LAB — CBC WITH DIFFERENTIAL/PLATELET
BASO%: 0 % (ref 0.0–2.0)
BASOS ABS: 0 10*3/uL (ref 0.0–0.1)
EOS ABS: 0.1 10*3/uL (ref 0.0–0.5)
EOS%: 3.1 % (ref 0.0–7.0)
HCT: 34.1 % — ABNORMAL LOW (ref 38.4–49.9)
HEMOGLOBIN: 11.4 g/dL — AB (ref 13.0–17.1)
LYMPH#: 1.5 10*3/uL (ref 0.9–3.3)
LYMPH%: 45.7 % (ref 14.0–49.0)
MCH: 33.6 pg — AB (ref 27.2–33.4)
MCHC: 33.4 g/dL (ref 32.0–36.0)
MCV: 100.6 fL — ABNORMAL HIGH (ref 79.3–98.0)
MONO#: 0.4 10*3/uL (ref 0.1–0.9)
MONO%: 11.3 % (ref 0.0–14.0)
NEUT%: 39.9 % (ref 39.0–75.0)
NEUTROS ABS: 1.3 10*3/uL — AB (ref 1.5–6.5)
Platelets: 110 10*3/uL — ABNORMAL LOW (ref 140–400)
RBC: 3.39 10*6/uL — ABNORMAL LOW (ref 4.20–5.82)
RDW: 14.9 % — AB (ref 11.0–14.6)
WBC: 3.3 10*3/uL — AB (ref 4.0–10.3)

## 2014-05-15 LAB — COMPREHENSIVE METABOLIC PANEL (CC13)
ALK PHOS: 71 U/L (ref 40–150)
ALT: 32 U/L (ref 0–55)
ANION GAP: 9 meq/L (ref 3–11)
AST: 33 U/L (ref 5–34)
Albumin: 3.8 g/dL (ref 3.5–5.0)
BILIRUBIN TOTAL: 0.45 mg/dL (ref 0.20–1.20)
BUN: 18.6 mg/dL (ref 7.0–26.0)
CO2: 27 mEq/L (ref 22–29)
CREATININE: 1.4 mg/dL — AB (ref 0.7–1.3)
Calcium: 9.5 mg/dL (ref 8.4–10.4)
Chloride: 105 mEq/L (ref 98–109)
EGFR: 75 mL/min/{1.73_m2} — ABNORMAL LOW (ref >90)
Glucose: 114 mg/dl (ref 70–140)
Potassium: 3.5 mEq/L (ref 3.5–5.1)
SODIUM: 142 meq/L (ref 136–145)
TOTAL PROTEIN: 6.7 g/dL (ref 6.4–8.3)

## 2014-05-15 NOTE — Progress Notes (Signed)
Hematology and Oncology Follow Up Visit  Darrell Russo [id number] 03-10-1975 39 y.o. 05/15/2014 8:50 AM  CC: Darrell Caroli, MD    Principle Diagnosis: This is a 39 year old gentleman with chronic phase CML diagnosed in 2007, presented with leukocytosis and BCR-ABL positive peripheral blood.  Current therapy: He is on Gleevec 400 mg total dose.  He has a hematological and molecular remission since May 2007.  Interim History: Darrell Russo presents today for a followup visit accompanied by his mother. He continues to reside in a group home but spends a lot of time with his mother. Since his last visit, he continues to do well.  He needed his thyroid medication adjusted for his hypothyroidism but otherwise has no symptoms. He did have episodic fatigue and sluggishness this seems to be improving. He had not reported any hospitalizations or illnesses.  Had not reported any toxicity associated with the [place]. His performance status and activity level remains at the same level at this point. He does not report any bleeding.  Overall activity level remain stable. He continues to enjoy excellent appetite and has some weight gain since last visit. He does not report any headaches or double vision or seizures. Is not reporting any chest pain or shortness of breath. Is not reporting any abdominal pain or constipation. Does not report any urinary symptoms. Rest of his review of systems unremarkable.   Medications: I have reviewed the patient's current medications. Current Outpatient Prescriptions  Medication Sig Dispense Refill  . carbamazepine (ANTIPSYCHOTIC - EQUETRO) 200 MG CP12 Take 200 mg by mouth 2 (two) times daily.      . Cholecalciferol (VITAMIN D3) 1000 UNITS CAPS Take 1 capsule by mouth.    . divalproex (DEPAKOTE ER) 500 MG 24 hr tablet Take 2,500 mg by mouth daily.      . haloperidol (HALDOL) 1 MG tablet Take 1.5 mg by mouth 2 (two) times daily.     . hydrochlorothiazide (HYDRODIURIL) 25 MG tablet  Take 25 mg by mouth daily.      Marland Kitchen imatinib (GLEEVEC) 100 MG tablet Take 100 mg by mouth daily. Take with meals and large glass of water.Caution:Chemotherapy     . levothyroxine (LEVOTHROID) 300 MCG tablet Take 300 mcg by mouth daily.      Marland Kitchen liothyronine (CYTOMEL) 25 MCG tablet Take 25 mcg by mouth daily.      . metoprolol (TOPROL-XL) 50 MG 24 hr tablet Take 50 mg by mouth daily.      . minocycline (MINOCIN,DYNACIN) 100 MG capsule Take 100 mg by mouth 2 (two) times daily.      . mupirocin (BACTROBAN) 2 % ointment Apply topically 3 (three) times daily.      . naproxen (NAPROSYN) 500 MG tablet Take 500 mg by mouth 2 (two) times daily with a meal.      . QUEtiapine (SEROQUEL XR) 50 MG TB24 Take 200 mg by mouth.      . traMADol (ULTRAM) 50 MG tablet Take 50 mg by mouth every 6 (six) hours as needed. Maximum dose= 8 tablets per day     . triazolam (HALCION) 0.25 MG tablet Take 0.25 mg by mouth at bedtime as needed.      . zonisamide (ZONEGRAN) 100 MG capsule Take 300 mg by mouth daily.       No current facility-administered medications for this visit.    Allergies: No Known Allergies  Past Medical History, Surgical history, Social history, and Family History were reviewed and updated.   Physical  Exam: Blood pressure 133/75, pulse 93, temperature 98 F (36.7 C), temperature source Oral, resp. rate 18, height 6' (1.829 m), weight 267 lb 14.4 oz (121.519 kg), SpO2 100 %. ECOG: 1 General appearance: alert awake not in any distress. Head: Normocephalic, without obvious abnormality, atraumatic Neck: no adenopathy Lymph nodes: Cervical, supraclavicular, and axillary nodes normal. Heart:regular rate and rhythm, S1, S2 normal, no murmur, click, rub or gallop Lung:chest clear, no wheezing, rales, normal symmetric air entry Abdomin: soft, non-tender, without masses or organomegaly EXT:no erythema, induration, or nodules   Lab Results: Lab Results  Component Value Date   WBC 3.3* 05/15/2014    HGB 11.4* 05/15/2014   HCT 34.1* 05/15/2014   MCV 100.6* 05/15/2014   PLT 110* 05/15/2014     Chemistry      Component Value Date/Time   NA 141 08/28/2013 0759   NA 132* 10/17/2011 1244   K 3.3* 08/28/2013 0759   K 3.9 10/17/2011 1244   CL 98 05/28/2012 1453   CL 96 10/17/2011 1244   CO2 25 08/28/2013 0759   CO2 26 10/11/2011 1006   BUN 19.0 08/28/2013 0759   BUN 16 10/17/2011 1244   CREATININE 1.4* 08/28/2013 0759   CREATININE 1.10 10/17/2011 1244      Component Value Date/Time   CALCIUM 9.7 08/28/2013 0759   CALCIUM 9.0 10/11/2011 1006   ALKPHOS 71 08/28/2013 0759   ALKPHOS 68 10/11/2011 1006   AST 46* 08/28/2013 0759   AST 35 10/11/2011 1006   ALT 37 08/28/2013 0759   ALT 30 10/11/2011 1006   BILITOT 0.46 08/28/2013 0759   BILITOT 0.3 10/11/2011 1006       Impression and Plan:  This is a 39 year old gentleman with the following issues. 1. Chronic phase CML.  He is on [place] since May 2007.  He continues to be with complete hematological and molecular remission.  His liver function tests have been done routinely without any abnormalities.  The plan is to continue the current dose and schedule without any changes. We will continue checking molecular studies on an annual basis.  2. Mild anemia and leukopenia.  This is undoubtedly dilated Gleevec effect, but not enough to suggest or recommend changes in the Gleevec dose. 3. Follow up: in 8 months.   [facility], MD 3/18/20168:50 AM

## 2014-05-15 NOTE — Telephone Encounter (Signed)
Gave avs & calendar for November.  °

## 2014-12-01 ENCOUNTER — Telehealth: Payer: Self-pay | Admitting: *Deleted

## 2014-12-01 DIAGNOSIS — C9210 Chronic myeloid leukemia, BCR/ABL-positive, not having achieved remission: Secondary | ICD-10-CM

## 2014-12-01 MED ORDER — IMATINIB MESYLATE 100 MG PO TABS: ORAL_TABLET | ORAL | Status: DC

## 2014-12-01 NOTE — Telephone Encounter (Addendum)
Dr. Elesa Hacker with [facility] [phone]) called requesting Gleevec refill for this patient.  Order can be called in.  Please verify/clarify dose.  Historical order reads Gleevec 100 mg daily.  Per [place], [facility] has filled as two, 100 mg tablets twice daily to equal 400 mg per 24 hours.

## 2014-12-01 NOTE — Telephone Encounter (Signed)
He is on 400 mg total daily dose.

## 2014-12-01 NOTE — Telephone Encounter (Signed)
[facility] in [place], [place]. With this order per Dr. Alen Blew.

## 2014-12-07 ENCOUNTER — Telehealth: Payer: Self-pay

## 2014-12-07 NOTE — Telephone Encounter (Signed)
Darrell Russo called asking for last visit records for the facility where the pt is residing. Last OV note faxed to [phone] with remark of next appt 01/15/15 at 0830.

## 2015-01-15 ENCOUNTER — Other Ambulatory Visit (HOSPITAL_BASED_OUTPATIENT_CLINIC_OR_DEPARTMENT_OTHER): Payer: Medicaid Other

## 2015-01-15 ENCOUNTER — Ambulatory Visit (HOSPITAL_BASED_OUTPATIENT_CLINIC_OR_DEPARTMENT_OTHER): Payer: Medicaid Other | Admitting: Oncology

## 2015-01-15 ENCOUNTER — Telehealth: Payer: Self-pay | Admitting: Oncology

## 2015-01-15 VITALS — BP 128/74 | HR 94 | Temp 98.3°F | Resp 18 | Ht 72.0 in | Wt 256.8 lb

## 2015-01-15 DIAGNOSIS — C9210 Chronic myeloid leukemia, BCR/ABL-positive, not having achieved remission: Secondary | ICD-10-CM | POA: Diagnosis present

## 2015-01-15 LAB — CBC WITH DIFFERENTIAL/PLATELET
BASO%: 0.2 % (ref 0.0–2.0)
BASOS ABS: 0 10*3/uL (ref 0.0–0.1)
EOS ABS: 0.1 10*3/uL (ref 0.0–0.5)
EOS%: 2.7 % (ref 0.0–7.0)
HEMATOCRIT: 36.4 % — AB (ref 38.4–49.9)
HGB: 12.1 g/dL — ABNORMAL LOW (ref 13.0–17.1)
LYMPH#: 1.3 10*3/uL (ref 0.9–3.3)
LYMPH%: 39.5 % (ref 14.0–49.0)
MCH: 33.2 pg (ref 27.2–33.4)
MCHC: 33.3 g/dL (ref 32.0–36.0)
MCV: 99.6 fL — AB (ref 79.3–98.0)
MONO#: 0.3 10*3/uL (ref 0.1–0.9)
MONO%: 9.6 % (ref 0.0–14.0)
NEUT#: 1.6 10*3/uL (ref 1.5–6.5)
NEUT%: 48 % (ref 39.0–75.0)
PLATELETS: 112 10*3/uL — AB (ref 140–400)
RBC: 3.65 10*6/uL — AB (ref 4.20–5.82)
RDW: 14.2 % (ref 11.0–14.6)
WBC: 3.3 10*3/uL — ABNORMAL LOW (ref 4.0–10.3)

## 2015-01-15 LAB — COMPREHENSIVE METABOLIC PANEL (CC13)
ALT: 33 U/L (ref 0–55)
ANION GAP: 9 meq/L (ref 3–11)
AST: 38 U/L — ABNORMAL HIGH (ref 5–34)
Albumin: 4.1 g/dL (ref 3.5–5.0)
Alkaline Phosphatase: 81 U/L (ref 40–150)
BILIRUBIN TOTAL: 0.37 mg/dL (ref 0.20–1.20)
BUN: 22.5 mg/dL (ref 7.0–26.0)
CALCIUM: 10 mg/dL (ref 8.4–10.4)
CHLORIDE: 105 meq/L (ref 98–109)
CO2: 26 meq/L (ref 22–29)
CREATININE: 1.4 mg/dL — AB (ref 0.7–1.3)
EGFR: 72 mL/min/{1.73_m2} — AB (ref >90)
Glucose: 115 mg/dl (ref 70–140)
Potassium: 3.5 mEq/L (ref 3.5–5.1)
Sodium: 140 mEq/L (ref 136–145)
Total Protein: 7.2 g/dL (ref 6.4–8.3)

## 2015-01-15 NOTE — Telephone Encounter (Signed)
per of to sch pt appt-gave pt copy of avs °

## 2015-01-15 NOTE — Progress Notes (Signed)
Hematology and Oncology Follow Up Visit  Darrell Russo [id number] 1975/11/21 39 y.o. 01/15/2015 8:55 AM  CC: Darrell Caroli, MD    Principle Diagnosis: This is a 39 year old gentleman with chronic phase CML diagnosed in 2007, presented with leukocytosis and BCR-ABL positive peripheral blood.  Current therapy: He is on Gleevec 400 mg total dose.  He has a hematological and molecular remission since May 2007.  Interim History: Darrell Russo presents today for a followup visit accompanied by his mother. Since the last visit, no complications reported at this time. He continues to reside in a group home without any recent hospitalization or illnesses. His mother is very involved in his care and he spent some time at home with her occasionally. His behavioral issues have been under reasonable control as of late.   Had not reported any toxicity associated with the [place]. No fluid retention or excessive fatigue noted. His performance status and activity level remains at the same level at this point. His appetite remains excellent and participates in group activities at his group home.   He does not report any headaches or double vision or seizures. Is not reporting any chest pain or shortness of breath. Is not reporting any abdominal pain or constipation. Does not report any urinary symptoms. Rest of his review of systems unremarkable.   Medications: I have reviewed the patient's current medications. Current Outpatient Prescriptions  Medication Sig Dispense Refill  . carbamazepine (ANTIPSYCHOTIC - EQUETRO) 200 MG CP12 Take 200 mg by mouth 2 (two) times daily.      . Cholecalciferol (VITAMIN D3) 1000 UNITS CAPS Take 1 capsule by mouth.    . divalproex (DEPAKOTE ER) 500 MG 24 hr tablet Take 2,500 mg by mouth daily.      . haloperidol (HALDOL) 1 MG tablet Take 1.5 mg by mouth 2 (two) times daily.     . hydrochlorothiazide (HYDRODIURIL) 25 MG tablet Take 25 mg by mouth daily.      Marland Kitchen imatinib (GLEEVEC) 100  MG tablet Take two pills twice daily with meals and large glass of water.Caution:Chemotherapy 120 tablet 1  . levothyroxine (LEVOTHROID) 300 MCG tablet Take 300 mcg by mouth daily.      Marland Kitchen liothyronine (CYTOMEL) 25 MCG tablet Take 25 mcg by mouth daily.      . metoprolol (TOPROL-XL) 50 MG 24 hr tablet Take 50 mg by mouth daily.      . minocycline (MINOCIN,DYNACIN) 100 MG capsule Take 100 mg by mouth 2 (two) times daily.      . mupirocin (BACTROBAN) 2 % ointment Apply topically 3 (three) times daily.      . naproxen (NAPROSYN) 500 MG tablet Take 500 mg by mouth 2 (two) times daily with a meal.      . QUEtiapine (SEROQUEL XR) 50 MG TB24 Take 200 mg by mouth.      . traMADol (ULTRAM) 50 MG tablet Take 50 mg by mouth every 6 (six) hours as needed. Maximum dose= 8 tablets per day     . triazolam (HALCION) 0.25 MG tablet Take 0.25 mg by mouth at bedtime as needed.      . zonisamide (ZONEGRAN) 100 MG capsule Take 300 mg by mouth daily.       No current facility-administered medications for this visit.    Allergies: No Known Allergies  Past Medical History, Surgical history, Social history, and Family History were reviewed and updated.   Physical Exam: Blood pressure 128/74, pulse 94, temperature 98.3 F (36.8 C), temperature source  Oral, resp. rate 18, height 6' (1.829 m), weight 256 lb 12.8 oz (116.484 kg), SpO2 100 %. ECOG: 1 General appearance: alert awake , without distress. Head: Normocephalic, without obvious abnormality without any oral ulcers or lesions. Neck: no adenopathy Lymph nodes: Cervical, supraclavicular, and axillary nodes normal. Heart:regular rate and rhythm, S1, S2 normal, no murmur, click, rub or gallop Lung:chest clear, no wheezing, rales, normal symmetric air entry Abdomin: soft, non-tender, without masses or organomegaly EXT:no erythema, induration, or nodules. No edema noted.   Lab Results: Lab Results  Component Value Date   WBC 3.3* 01/15/2015   HGB 12.1*  01/15/2015   HCT 36.4* 01/15/2015   MCV 99.6* 01/15/2015   PLT 112* 01/15/2015     Chemistry      Component Value Date/Time   NA 142 05/15/2014 0816   NA 132* 10/17/2011 1244   K 3.5 05/15/2014 0816   K 3.9 10/17/2011 1244   CL 98 05/28/2012 1453   CL 96 10/17/2011 1244   CO2 27 05/15/2014 0816   CO2 26 10/11/2011 1006   BUN 18.6 05/15/2014 0816   BUN 16 10/17/2011 1244   CREATININE 1.4* 05/15/2014 0816   CREATININE 1.10 10/17/2011 1244      Component Value Date/Time   CALCIUM 9.5 05/15/2014 0816   CALCIUM 9.0 10/11/2011 1006   ALKPHOS 71 05/15/2014 0816   ALKPHOS 68 10/11/2011 1006   AST 33 05/15/2014 0816   AST 35 10/11/2011 1006   ALT 32 05/15/2014 0816   ALT 30 10/11/2011 1006   BILITOT 0.45 05/15/2014 0816   BILITOT 0.3 10/11/2011 1006       Impression and Plan:  This is a 39 year old gentleman with the following issues. 1. Chronic phase CML.  He is on [place] since May 2007.  He continues to be with complete hematological and molecular remission.  His liver function tests have been done routinely without any abnormalities.  The plan is to continue the current dose and schedule without any changes. We will continue checking molecular studies on an annual basis. This was checked on November 2016 and currently pending from today. No need for salvage agents at this time but they are available if his molecular testing indicate minimal residual disease.  2. Mild anemia and leukopenia. Likely related to [place] effect, but not enough to suggest or recommend changes in the Gleevec dose. 3. Follow up: in 7 to 8 months.   [facility], MD 11/18/20168:55 AM

## 2015-05-24 ENCOUNTER — Encounter: Payer: Self-pay | Admitting: *Deleted

## 2015-05-24 ENCOUNTER — Other Ambulatory Visit: Payer: Self-pay | Admitting: *Deleted

## 2015-05-24 DIAGNOSIS — C9210 Chronic myeloid leukemia, BCR/ABL-positive, not having achieved remission: Secondary | ICD-10-CM

## 2015-05-24 MED ORDER — IMATINIB MESYLATE 100 MG PO TABS: ORAL_TABLET | ORAL | Status: DC

## 2015-08-02 ENCOUNTER — Telehealth: Payer: Self-pay | Admitting: Oncology

## 2015-08-02 NOTE — Telephone Encounter (Signed)
Faxed pt office notes to [facility]

## 2015-08-13 ENCOUNTER — Ambulatory Visit (HOSPITAL_BASED_OUTPATIENT_CLINIC_OR_DEPARTMENT_OTHER): Payer: Medicaid Other | Admitting: Oncology

## 2015-08-13 ENCOUNTER — Other Ambulatory Visit (HOSPITAL_BASED_OUTPATIENT_CLINIC_OR_DEPARTMENT_OTHER): Payer: Medicaid Other

## 2015-08-13 ENCOUNTER — Telehealth: Payer: Self-pay | Admitting: Oncology

## 2015-08-13 VITALS — BP 136/82 | HR 90 | Temp 97.9°F | Resp 18 | Ht 72.0 in | Wt 272.0 lb

## 2015-08-13 DIAGNOSIS — D649 Anemia, unspecified: Secondary | ICD-10-CM

## 2015-08-13 DIAGNOSIS — D72819 Decreased white blood cell count, unspecified: Secondary | ICD-10-CM | POA: Diagnosis not present

## 2015-08-13 DIAGNOSIS — C9210 Chronic myeloid leukemia, BCR/ABL-positive, not having achieved remission: Secondary | ICD-10-CM | POA: Diagnosis present

## 2015-08-13 LAB — COMPREHENSIVE METABOLIC PANEL
ALT: 36 U/L (ref 0–55)
AST: 37 U/L — AB (ref 5–34)
Albumin: 4.1 g/dL (ref 3.5–5.0)
Alkaline Phosphatase: 60 U/L (ref 40–150)
Anion Gap: 9 mEq/L (ref 3–11)
BUN: 21.3 mg/dL (ref 7.0–26.0)
CALCIUM: 9.6 mg/dL (ref 8.4–10.4)
CHLORIDE: 106 meq/L (ref 98–109)
CO2: 25 mEq/L (ref 22–29)
CREATININE: 1.4 mg/dL — AB (ref 0.7–1.3)
EGFR: 76 mL/min/{1.73_m2} — ABNORMAL LOW (ref >90)
GLUCOSE: 148 mg/dL — AB (ref 70–140)
POTASSIUM: 3.4 meq/L — AB (ref 3.5–5.1)
SODIUM: 141 meq/L (ref 136–145)
Total Bilirubin: 0.43 mg/dL (ref 0.20–1.20)
Total Protein: 7.1 g/dL (ref 6.4–8.3)

## 2015-08-13 LAB — CBC WITH DIFFERENTIAL/PLATELET
BASO%: 0.2 % (ref 0.0–2.0)
Basophils Absolute: 0 10*3/uL (ref 0.0–0.1)
EOS ABS: 0.1 10*3/uL (ref 0.0–0.5)
EOS%: 3.1 % (ref 0.0–7.0)
HEMATOCRIT: 37.5 % — AB (ref 38.4–49.9)
HGB: 12.4 g/dL — ABNORMAL LOW (ref 13.0–17.1)
LYMPH#: 1.3 10*3/uL (ref 0.9–3.3)
LYMPH%: 41.2 % (ref 14.0–49.0)
MCH: 32.9 pg (ref 27.2–33.4)
MCHC: 33.1 g/dL (ref 32.0–36.0)
MCV: 99.3 fL — ABNORMAL HIGH (ref 79.3–98.0)
MONO#: 0.3 10*3/uL (ref 0.1–0.9)
MONO%: 8 % (ref 0.0–14.0)
NEUT%: 47.5 % (ref 39.0–75.0)
NEUTROS ABS: 1.5 10*3/uL (ref 1.5–6.5)
PLATELETS: 112 10*3/uL — AB (ref 140–400)
RBC: 3.78 10*6/uL — ABNORMAL LOW (ref 4.20–5.82)
RDW: 14.3 % (ref 11.0–14.6)
WBC: 3.3 10*3/uL — ABNORMAL LOW (ref 4.0–10.3)

## 2015-08-13 NOTE — Progress Notes (Signed)
Hematology and Oncology Follow Up Visit  Darrell Russo [id number] 1975/03/09 40 y.o. 08/13/2015 8:27 AM  CC: Darrell Caroli, MD    Principle Diagnosis: This is a 40 year old gentleman with chronic phase CML diagnosed in 2007. He presented with leukocytosis and BCR-ABL positive transcripts in the peripheral blood.  Current therapy: He is on Gleevec 400 mg total dose.  He accomplished hematological and molecular remission at this time.  Interim History: Darrell Russo presents today for a followup visit accompanied by his mother. Since the last visit, no reports feeling well without any recent issues. He continues to reside in a group home but spends the weekend at home with his mother. He denied any recent infections or hospitalizations. He denied any recent bleeding or thrombosis episodes. No constitutional symptoms noted including weight loss or appetite changes. No fevers or chills. His behavioral issues have been under reasonable control as of late.  He continues to take [place] without any issues. He denied fluid retention or excessive fatigue noted. His performance status and activity level remains at the same level at this point. He continues to be active including playing basketball and swimming at the group home.  He does not report any headaches or double vision or seizures. He denied any new neurological symptoms. He does not report any chest pain, palpitation orthopnea. Does not report any cough, wheezing or dyspnea on exertion. He does not report any nausea, vomiting or abdominal pain. Does not report any urinary symptoms. Rest of his review of systems unremarkable.   Medications: I have reviewed the patient's current medications. Current Outpatient Prescriptions  Medication Sig Dispense Refill  . carbamazepine (ANTIPSYCHOTIC - EQUETRO) 200 MG CP12 Take 200 mg by mouth 2 (two) times daily.      . Cholecalciferol (VITAMIN D3) 1000 UNITS CAPS Take 1 capsule by mouth.    . divalproex  (DEPAKOTE ER) 500 MG 24 hr tablet Take 2,500 mg by mouth daily.      . haloperidol (HALDOL) 1 MG tablet Take 1.5 mg by mouth 2 (two) times daily.     . hydrochlorothiazide (HYDRODIURIL) 25 MG tablet Take 25 mg by mouth daily.      Marland Kitchen imatinib (GLEEVEC) 100 MG tablet Take two pills twice daily with meals and large glass of water.Caution:Chemotherapy 120 tablet 1  . levothyroxine (LEVOTHROID) 300 MCG tablet Take 300 mcg by mouth daily.      Marland Kitchen liothyronine (CYTOMEL) 25 MCG tablet Take 25 mcg by mouth daily.      . metoprolol (TOPROL-XL) 50 MG 24 hr tablet Take 50 mg by mouth daily.      . minocycline (MINOCIN,DYNACIN) 100 MG capsule Take 100 mg by mouth 2 (two) times daily.      . mupirocin (BACTROBAN) 2 % ointment Apply topically 3 (three) times daily.      . naproxen (NAPROSYN) 500 MG tablet Take 500 mg by mouth 2 (two) times daily with a meal.      . QUEtiapine (SEROQUEL XR) 50 MG TB24 Take 200 mg by mouth.      . traMADol (ULTRAM) 50 MG tablet Take 50 mg by mouth every 6 (six) hours as needed. Maximum dose= 8 tablets per day     . triazolam (HALCION) 0.25 MG tablet Take 0.25 mg by mouth at bedtime as needed.      . zonisamide (ZONEGRAN) 100 MG capsule Take 300 mg by mouth daily.       No current facility-administered medications for this visit.  Allergies: No Known Allergies  Past Medical History, Surgical history, Social history, and Family History were reviewed and updated.   Physical Exam: Blood pressure 136/82, pulse 90, temperature 97.9 F (36.6 C), temperature source Oral, resp. rate 18, height 6' (1.829 m), weight 272 lb (123.378 kg), SpO2 100 %. ECOG: 1 General appearance: Pleasant-appearing gentleman without distress. Head: Normocephalic, without obvious abnormality sclerae anicteric. Neck: no adenopathy Lymph nodes: Cervical, supraclavicular, and axillary nodes normal. Heart:regular rate and rhythm, S1, S2 normal, no murmur, click, rub or gallop Lung:chest clear, no  wheezing, rales, normal symmetric air entry Abdomin: soft, non-tender, without masses or organomegaly no shifting dullness or ascites. EXT:no erythema, induration, or nodules. No edema noted.   Lab Results: Lab Results  Component Value Date   WBC 3.3* 08/13/2015   HGB 12.4* 08/13/2015   HCT 37.5* 08/13/2015   MCV 99.3* 08/13/2015   PLT 112* 08/13/2015     Chemistry      Component Value Date/Time   NA 140 01/15/2015 0822   NA 132* 10/17/2011 1244   K 3.5 01/15/2015 0822   K 3.9 10/17/2011 1244   CL 98 05/28/2012 1453   CL 96 10/17/2011 1244   CO2 26 01/15/2015 0822   CO2 26 10/11/2011 1006   BUN 22.5 01/15/2015 0822   BUN 16 10/17/2011 1244   CREATININE 1.4* 01/15/2015 0822   CREATININE 1.10 10/17/2011 1244      Component Value Date/Time   CALCIUM 10.0 01/15/2015 0822   CALCIUM 9.0 10/11/2011 1006   ALKPHOS 81 01/15/2015 0822   ALKPHOS 68 10/11/2011 1006   AST 38* 01/15/2015 0822   AST 35 10/11/2011 1006   ALT 33 01/15/2015 0822   ALT 30 10/11/2011 1006   BILITOT 0.37 01/15/2015 0822   BILITOT 0.3 10/11/2011 1006       Impression and Plan:  This is a 40 year old gentleman with the following issues. 1. Chronic phase CML.  He is on [place] since May 2007.  He continues to be with complete hematological and molecular remission.  His liver function tests have been done routinely without any abnormalities.  His most recent molecular testing for BCR-ABL was done in November 2017 and continues to show major molecular remission. The plan is to continue the same dose and schedule without any adjustment. We have discussed the role of Gleevec continuation but at this time, and remains experimental and I see no need to do so given his excellent tolerance to this medication.  2. Mild anemia and leukopenia. Likely related to [place] effect, but not enough to suggest or recommend changes in the Gleevec dose. 3. Follow up: in April 2018.   [facility], MD 6/16/20178:27 AM

## 2015-08-13 NOTE — Telephone Encounter (Signed)
Gave and printed appt sched and avs fo rpt for [id number]

## 2015-10-25 ENCOUNTER — Other Ambulatory Visit: Payer: Self-pay | Admitting: *Deleted

## 2015-10-25 DIAGNOSIS — C9210 Chronic myeloid leukemia, BCR/ABL-positive, not having achieved remission: Secondary | ICD-10-CM

## 2015-10-25 MED ORDER — IMATINIB MESYLATE 100 MG PO TABS: ORAL_TABLET | ORAL | 1 refills | Status: DC

## 2016-04-21 ENCOUNTER — Other Ambulatory Visit (HOSPITAL_BASED_OUTPATIENT_CLINIC_OR_DEPARTMENT_OTHER): Payer: Medicaid Other

## 2016-04-21 ENCOUNTER — Telehealth: Payer: Self-pay | Admitting: Oncology

## 2016-04-21 ENCOUNTER — Ambulatory Visit (HOSPITAL_BASED_OUTPATIENT_CLINIC_OR_DEPARTMENT_OTHER): Payer: Medicaid Other | Admitting: Oncology

## 2016-04-21 VITALS — BP 130/80 | HR 91 | Temp 98.4°F | Resp 18 | Ht 72.0 in | Wt 264.6 lb

## 2016-04-21 DIAGNOSIS — C9210 Chronic myeloid leukemia, BCR/ABL-positive, not having achieved remission: Secondary | ICD-10-CM | POA: Diagnosis present

## 2016-04-21 DIAGNOSIS — D72819 Decreased white blood cell count, unspecified: Secondary | ICD-10-CM | POA: Diagnosis not present

## 2016-04-21 DIAGNOSIS — D649 Anemia, unspecified: Secondary | ICD-10-CM

## 2016-04-21 LAB — CBC WITH DIFFERENTIAL/PLATELET
BASO%: 0.4 % (ref 0.0–2.0)
BASOS ABS: 0 10*3/uL (ref 0.0–0.1)
EOS%: 2.9 % (ref 0.0–7.0)
Eosinophils Absolute: 0.1 10*3/uL (ref 0.0–0.5)
HCT: 34.9 % — ABNORMAL LOW (ref 38.4–49.9)
HGB: 11.7 g/dL — ABNORMAL LOW (ref 13.0–17.1)
LYMPH#: 1.7 10*3/uL (ref 0.9–3.3)
LYMPH%: 40.7 % (ref 14.0–49.0)
MCH: 34.1 pg — AB (ref 27.2–33.4)
MCHC: 33.6 g/dL (ref 32.0–36.0)
MCV: 101.5 fL — ABNORMAL HIGH (ref 79.3–98.0)
MONO#: 0.4 10*3/uL (ref 0.1–0.9)
MONO%: 9.6 % (ref 0.0–14.0)
NEUT#: 2 10*3/uL (ref 1.5–6.5)
NEUT%: 46.4 % (ref 39.0–75.0)
PLATELETS: 106 10*3/uL — AB (ref 140–400)
RBC: 3.44 10*6/uL — AB (ref 4.20–5.82)
RDW: 14.9 % — ABNORMAL HIGH (ref 11.0–14.6)
WBC: 4.3 10*3/uL (ref 4.0–10.3)

## 2016-04-21 LAB — COMPREHENSIVE METABOLIC PANEL
ALT: 42 U/L (ref 0–55)
ANION GAP: 10 meq/L (ref 3–11)
AST: 44 U/L — AB (ref 5–34)
Albumin: 4.3 g/dL (ref 3.5–5.0)
Alkaline Phosphatase: 72 U/L (ref 40–150)
BUN: 27.8 mg/dL — ABNORMAL HIGH (ref 7.0–26.0)
CALCIUM: 9.7 mg/dL (ref 8.4–10.4)
CHLORIDE: 105 meq/L (ref 98–109)
CO2: 26 mEq/L (ref 22–29)
Creatinine: 1.6 mg/dL — ABNORMAL HIGH (ref 0.7–1.3)
EGFR: 62 mL/min/{1.73_m2} — ABNORMAL LOW (ref >90)
Glucose: 89 mg/dl (ref 70–140)
POTASSIUM: 3.7 meq/L (ref 3.5–5.1)
Sodium: 141 mEq/L (ref 136–145)
Total Bilirubin: 0.5 mg/dL (ref 0.20–1.20)
Total Protein: 7.3 g/dL (ref 6.4–8.3)

## 2016-04-21 NOTE — Progress Notes (Signed)
Hematology and Oncology Follow Up Visit  Darrell Russo [id number] 12-14-1975 42 y.o. 04/21/2016 8:28 AM  CC: [name], MD    Principle Diagnosis: This is a 41 year old gentleman with chronic phase CML diagnosed in 2007. He presented with leukocytosis and BCR-ABL positive transcripts in the peripheral blood.  Current therapy: He is on Gleevec 400 mg total dose.  He continues to be an hematological and molecular remission.  Interim History: Darrell Russo presents today for a followup visit accompanied by his mother. Since the last visit, no continues to do well without any recent complaints. He continues to reside in a group home but spends the weekend at home with his mother. He remains reasonably active without any limitation or hindrance. He denied any recent infections or hospitalizations. He denied any recent bleeding or thrombosis episodes. He denied any fevers, chills or constitutional symptoms.  He continues to take [place] without any side effects. He denied any fluid retention or edema. He denied any GI toxicities including nausea or poor by mouth intake.  He does not report any headaches or double vision or seizures. He denied any new neurological symptoms. He does not report any chest pain, palpitation orthopnea. Does not report any cough, wheezing or dyspnea on exertion. He does not report any nausea, vomiting or abdominal pain. Does not report any urinary symptoms. He does not report any arthralgias or myalgias. Rest of his review of systems unremarkable.   Medications: I have reviewed the patient's current medications. Current Outpatient Prescriptions  Medication Sig Dispense Refill  . carbamazepine (ANTIPSYCHOTIC - EQUETRO) 200 MG CP12 Take 200 mg by mouth 2 (two) times daily.      . Cholecalciferol (VITAMIN D3) 1000 UNITS CAPS Take 1 capsule by mouth.    . divalproex (DEPAKOTE ER) 500 MG 24 hr tablet Take 2,500 mg by mouth daily.      . haloperidol (HALDOL) 1 MG tablet Take  1.5 mg by mouth 2 (two) times daily.     . hydrochlorothiazide (HYDRODIURIL) 25 MG tablet Take 25 mg by mouth daily.      Marland Kitchen imatinib (GLEEVEC) 100 MG tablet Take two pills twice daily with meals and large glass of water.Caution:Chemotherapy 120 tablet 1  . levothyroxine (LEVOTHROID) 300 MCG tablet Take 300 mcg by mouth daily.      Marland Kitchen liothyronine (CYTOMEL) 25 MCG tablet Take 25 mcg by mouth daily.      . metoprolol (TOPROL-XL) 50 MG 24 hr tablet Take 50 mg by mouth daily.      . minocycline (MINOCIN,DYNACIN) 100 MG capsule Take 100 mg by mouth 2 (two) times daily.      . mupirocin (BACTROBAN) 2 % ointment Apply topically 3 (three) times daily.      . naproxen (NAPROSYN) 500 MG tablet Take 500 mg by mouth 2 (two) times daily with a meal.      . QUEtiapine (SEROQUEL XR) 50 MG TB24 Take 200 mg by mouth.      . traMADol (ULTRAM) 50 MG tablet Take 50 mg by mouth every 6 (six) hours as needed. Maximum dose= 8 tablets per day     . triazolam (HALCION) 0.25 MG tablet Take 0.25 mg by mouth at bedtime as needed.      . zonisamide (ZONEGRAN) 100 MG capsule Take 300 mg by mouth daily.       No current facility-administered medications for this visit.     Allergies: No Known Allergies  Past Medical History, Surgical history, Social history, and  Family History were reviewed and updated.   Physical Exam: Blood pressure 130/80, pulse 91, temperature 98.4 F (36.9 C), temperature source Oral, resp. rate 18, height 6' (1.829 m), weight 264 lb 9.6 oz (120 kg), SpO2 100 %. ECOG: 1 General appearance: Alert, awake gentleman without distress. Head: Normocephalic, without obvious abnormality sclerae anicteric. Neck: no adenopathy Lymph nodes: Cervical, supraclavicular, and axillary nodes normal. Heart:regular rate and rhythm, S1, S2 normal, no murmur, click, rub or gallop Lung:chest clear, no wheezing, rales, normal symmetric air entry Abdomin: soft, non-tender, without masses or organomegaly no rebound or  guarding. EXT:no erythema, induration, or nodules. No edema noted.   Lab Results: Lab Results  Component Value Date   WBC 4.3 04/21/2016   HGB 11.7 (L) 04/21/2016   HCT 34.9 (L) 04/21/2016   MCV 101.5 (H) 04/21/2016   PLT 106 (L) 04/21/2016     Chemistry      Component Value Date/Time   NA 141 08/13/2015 0759   K 3.4 (L) 08/13/2015 0759   CL 98 05/28/2012 1453   CO2 25 08/13/2015 0759   BUN 21.3 08/13/2015 0759   CREATININE 1.4 (H) 08/13/2015 0759      Component Value Date/Time   CALCIUM 9.6 08/13/2015 0759   ALKPHOS 60 08/13/2015 0759   AST 37 (H) 08/13/2015 0759   ALT 36 08/13/2015 0759   BILITOT 0.43 08/13/2015 0759     BCR/ABL1: NOT DETECTED  in November 2016.  Impression and Plan:  This is a 41 year old gentleman with the following issues. 1. Chronic phase CML.  He is on [place] since May 2007.  He continues to be with complete hematological and molecular remission.  His physical examination and laboratory data today did not reveal any evidence to suggest recurrent disease. His molecular testing will be repeated today for follow-up purposes. The visit was therapy will be used if he started developing evidence of molecular relapse.  2. Mild anemia and leukopenia. Likely related to [place] effect, but not enough to suggest or recommend changes in the Gleevec dose. These are not dramatically different from previous counts. 3. Follow up: in October 2018.  [facility], MD 2/23/20188:28 AM

## 2016-04-21 NOTE — Telephone Encounter (Signed)
Appointments scheduled per 2/23 LOS. Patient given AVS report and calendars with future scheduled appointments. °

## 2016-07-04 ENCOUNTER — Encounter: Payer: Self-pay | Admitting: *Deleted

## 2016-11-28 ENCOUNTER — Emergency Department (HOSPITAL_COMMUNITY): Payer: Medicaid Other

## 2016-11-28 ENCOUNTER — Telehealth: Payer: Self-pay | Admitting: Oncology

## 2016-11-28 ENCOUNTER — Emergency Department (HOSPITAL_BASED_OUTPATIENT_CLINIC_OR_DEPARTMENT_OTHER)
Admit: 2016-11-28 | Discharge: 2016-11-28 | Disposition: A | Discharge status: Home / Self Care | Payer: Medicaid Other | Attending: Student | Admitting: Student

## 2016-11-28 ENCOUNTER — Encounter (HOSPITAL_COMMUNITY): Payer: Self-pay

## 2016-11-28 ENCOUNTER — Emergency Department (HOSPITAL_COMMUNITY)
Admission: EM | Admit: 2016-11-28 | Discharge: 2016-11-28 | Disposition: A | Discharge status: Home / Self Care | Payer: Medicaid Other | Attending: Emergency Medicine | Admitting: Emergency Medicine

## 2016-11-28 DIAGNOSIS — R609 Edema, unspecified: Secondary | ICD-10-CM

## 2016-11-28 DIAGNOSIS — M25552 Pain in left hip: Secondary | ICD-10-CM | POA: Diagnosis not present

## 2016-11-28 DIAGNOSIS — M7989 Other specified soft tissue disorders: Secondary | ICD-10-CM

## 2016-11-28 DIAGNOSIS — M79609 Pain in unspecified limb: Secondary | ICD-10-CM | POA: Diagnosis not present

## 2016-11-28 DIAGNOSIS — Z79899 Other long term (current) drug therapy: Secondary | ICD-10-CM | POA: Diagnosis not present

## 2016-11-28 DIAGNOSIS — F840 Autistic disorder: Secondary | ICD-10-CM | POA: Insufficient documentation

## 2016-11-28 DIAGNOSIS — R600 Localized edema: Secondary | ICD-10-CM | POA: Diagnosis not present

## 2016-11-28 DIAGNOSIS — I10 Essential (primary) hypertension: Secondary | ICD-10-CM | POA: Insufficient documentation

## 2016-11-28 LAB — I-STAT CHEM 8, ED
BUN: 23 mg/dL — AB (ref 6–20)
CREATININE: 1.3 mg/dL — AB (ref 0.61–1.24)
Calcium, Ion: 1.26 mmol/L (ref 1.15–1.40)
Chloride: 104 mmol/L (ref 101–111)
Glucose, Bld: 91 mg/dL (ref 65–99)
HEMATOCRIT: 36 % — AB (ref 39.0–52.0)
HEMOGLOBIN: 12.2 g/dL — AB (ref 13.0–17.0)
POTASSIUM: 3.8 mmol/L (ref 3.5–5.1)
SODIUM: 142 mmol/L (ref 135–145)
TCO2: 27 mmol/L (ref 22–32)

## 2016-11-28 LAB — MAGNESIUM: Magnesium: 1.9 mg/dL (ref 1.7–2.4)

## 2016-11-28 MED ORDER — ACETAMINOPHEN 325 MG PO TABS
650.0000 mg | ORAL_TABLET | Freq: Once | ORAL | Status: AC
  Administered 2016-11-28: 650 mg via ORAL
  Filled 2016-11-28: qty 2

## 2016-11-28 MED ORDER — CYCLOBENZAPRINE HCL 10 MG PO TABS: 10.0000 mg | ORAL_TABLET | Freq: Three times a day (TID) | ORAL | 0 refills | Status: DC | PRN

## 2016-11-28 NOTE — ED Notes (Signed)
Unable to ambulated pt. Per family, it was a struggle to get him from the bed to the wheelchair. Nurse was notified.

## 2016-11-28 NOTE — Progress Notes (Signed)
Preliminary results by tech - Venous Duplex Lower Ext. Completed. Negative for deep and superficial vein thrombosis in both legs.  Jalyssa Fleisher, BS, RDMS, RVT  

## 2016-11-28 NOTE — Telephone Encounter (Signed)
Patient cancelled appt for 10/5 due to being in the hospital - patient manager at home group called Sheliah Plane) appt r/s and patients family aware of appt date and time.

## 2016-11-28 NOTE — ED Notes (Signed)
Pt transported to [facility]

## 2016-11-28 NOTE — ED Triage Notes (Addendum)
Pt presents for evaluation of L hip and leg pain. Caregiver states he was seen by PCP on Friday for same and given cream for pain. States pt is having difficulty bearing weight which is not his normal. Pt is [facility] patient, treated at [facility] long. Pt is oriented to baseline. No known fall.

## 2016-11-28 NOTE — ED Provider Notes (Signed)
[place] DEPT Provider Note   CSN: [id number] Arrival date & time: 11/28/16  0944     History   Chief Complaint Chief Complaint  Patient presents with  . Hip Pain    HPI Darrell Russo is a 41 y.o. male.  HPI 41 year old African-American male past medical history significant forhypertension, autism presents to the ED with caregiver and mother at bedside for evaluation of left hip pain and leg pain. Patient was seen for same at primary care doctor on Friday and given NSAID cream. This has had no relief in patient's symptoms. They state the patient will complain and yell of left hip pain and right hip pain.However when I asked patient he does not complain of any pain. Patient does have a history of CML that has been treated by oncology who follows up regularly. Caregiver states the patient has been having difficulties weightbearing due to the pain. States that he has been walking more prior to his complaint than usual. He denies any back pain. They do report some bilateral lower extremity edema. No history of same. Denies any associated symptoms of chest pain or shortness of breath. Patient has had limited weightbearing due to the pain. Denies any trauma. Denies associated erythema, fever, chill. Mother does reports pt complains of intermitteant cramping.   Pt denies any ha, night sweats, hx of ivdu/cancer, loss or bowel or bladder, urinary retention, saddle paresthesias, lower extremity paresthesias.     Past Medical History:  Diagnosis Date  . Autism   . Folliculitis   . Hypertension   . Impulse control disorder   . Leukemia ([place])   . TMJ (dislocation of temporomandibular joint)     Patient Active Problem List   Diagnosis Date Noted  . GI BLEEDING 06/18/2008    Past Surgical History:  Procedure Laterality Date  . ANKLE SURGERY     l/ankle fracture 5 years ago       Home Medications    Prior to Admission medications   Medication Sig Start Date End Date  Taking? Authorizing Provider  carbamazepine (ANTIPSYCHOTIC - EQUETRO) 200 MG CP12 Take 200 mg by mouth 2 (two) times daily.      [provider]  Cholecalciferol (VITAMIN D3) 1000 UNITS CAPS Take 1 capsule by mouth.    [provider]  cyclobenzaprine (FLEXERIL) 10 MG tablet Take 1 tablet (10 mg total) by mouth 3 (three) times daily as needed for muscle spasms. 11/28/16   Doristine Devoid, PA-C  divalproex (DEPAKOTE ER) 500 MG 24 hr tablet Take 2,500 mg by mouth daily.      [provider]  haloperidol (HALDOL) 1 MG tablet Take 1.5 mg by mouth 2 (two) times daily.     [provider]  hydrochlorothiazide (HYDRODIURIL) 25 MG tablet Take 25 mg by mouth daily.      [provider]  imatinib (GLEEVEC) 100 MG tablet Take two pills twice daily with meals and large glass of water.Caution:Chemotherapy 10/25/15   Wyatt Portela, MD  levothyroxine (LEVOTHROID) 300 MCG tablet Take 300 mcg by mouth daily.      [provider]  liothyronine (CYTOMEL) 25 MCG tablet Take 25 mcg by mouth daily.      [provider]  metoprolol (TOPROL-XL) 50 MG 24 hr tablet Take 50 mg by mouth daily.      [provider]  minocycline (MINOCIN,DYNACIN) 100 MG capsule Take 100 mg by mouth 2 (two) times daily.      [provider]  mupirocin (BACTROBAN) 2 % ointment Apply topically 3 (three) times daily.      [provider]  naproxen (NAPROSYN) 500 MG tablet Take 500 mg by mouth 2 (two) times daily with a meal.      [provider]  QUEtiapine (SEROQUEL XR) 50 MG TB24 Take 200 mg by mouth.      [provider]  traMADol (ULTRAM) 50 MG tablet Take 50 mg by mouth every 6 (six) hours as needed. Maximum dose= 8 tablets per day     [provider]  triazolam (HALCION) 0.25 MG tablet Take 0.25 mg by mouth at bedtime as needed.      [provider]  zonisamide (ZONEGRAN) 100 MG capsule Take 300 mg by mouth daily.       [provider]    Family History No family history on file.  Social History Social History  Substance Use Topics  . Smoking status: Never Smoker  . Smokeless tobacco: Not on file  . Alcohol use No     Allergies   Patient has no known allergies.   Review of Systems Review of Systems  Constitutional: Negative for chills and fever.  Cardiovascular: Positive for leg swelling. Negative for chest pain and palpitations.  Gastrointestinal: Negative for nausea and vomiting.  Musculoskeletal: Positive for arthralgias, gait problem and myalgias. Negative for back pain and joint swelling.  Skin: Negative for color change.  Neurological: Negative for weakness and numbness.     Physical Exam Updated Vital Signs BP (!) 141/93 (BP Location: Right Arm)   Pulse 85   Temp (!) 97.4 F (36.3 C) (Oral)   Resp 16   Ht 6\' 1"  (1.854 m)   Wt 117.9 kg (260 lb)   SpO2 100%   BMI 34.30 kg/m   Physical Exam  Constitutional: He is oriented to person, place, and time. He appears well-developed and well-nourished.  Non-toxic appearance. No distress.  Eyes: Right eye exhibits no discharge. Left eye exhibits no discharge.  Neck: Normal range of motion. Neck supple.  Cardiovascular: Normal rate and regular rhythm.   Pulmonary/Chest: Effort normal and breath sounds normal. No respiratory distress. He has no wheezes. He has no rales.  Abdominal: Bowel sounds are normal.  Musculoskeletal: Normal range of motion.       Right hip: He exhibits tenderness, bony tenderness and swelling. He exhibits normal range of motion, normal strength, no crepitus, no deformity and no laceration.       Left hip: He exhibits tenderness, bony tenderness and swelling. He exhibits normal range of motion, normal strength, no crepitus, no deformity and no laceration.  Patient with vague complaints of bilateral hip pain with external and internal rotation. Full passive range of motion. Lower extremity strength is 5  out of 5. Patellar reflexes are normal. Sensation is intact. Cap refill is normal. There is no erythema or warmth over joints. Full range motion of all joints.  The patient does have 2+ pitting edema to the level of the knees bilaterally.  No midline T spine or L spine tenderness. No deformities or step offs noted. Full ROM. Pelvis is stable.   Neurological: He is alert and oriented to person, place, and time.  Skin: Skin is warm and dry. Capillary refill takes less than 2 seconds. No rash noted.  Psychiatric: His behavior is normal. Judgment and thought content normal.  Nursing note and vitals reviewed.    ED Treatments / Results  Labs (all labs ordered are  listed, but only abnormal results are displayed) Labs Reviewed  I-STAT CHEM 8, ED - Abnormal; Notable for the following:       Result Value   BUN 23 (*)    Creatinine, Ser 1.30 (*)    Hemoglobin 12.2 (*)    HCT 36.0 (*)    All other components within normal limits  MAGNESIUM    EKG  EKG Interpretation None       Radiology Dg Lumbar Spine Complete  Result Date: 11/28/2016 CLINICAL DATA:  Low back pain and lateral left hip pain for 3 days. No known injury. EXAM: LUMBAR SPINE - COMPLETE 4+ VIEW COMPARISON:  None. FINDINGS: No evidence of fracture, endplate erosion, or focal bone lesion. Borderline L5-S1 retrolisthesis. No degenerative disc narrowing or endplate spurring. There is levocurvature of the lumbar spine which could be positional or fixed. Excess re- ribs versus atypical lumbar segmentation with 4 lumbar type vertebral bodies. IMPRESSION: No acute or degenerative finding. Electronically Signed   By: Monte Fantasia M.D.   On: 11/28/2016 14:23   Dg Hip Unilat With Pelvis 2-3 Views Left  Result Date: 11/28/2016 CLINICAL DATA:  Left hip pain for the past 5 days.  No known injury. EXAM: DG HIP (WITH OR WITHOUT PELVIS) 2-3V LEFT COMPARISON:  None. FINDINGS: No acute fracture or malalignment. Mild bilateral hip joint  space narrowing. Bone mineralization is normal. Soft tissues are unremarkable. IMPRESSION: Mild bilateral hip joint degenerative changes. No acute osseous abnormality. Electronically Signed   By: Titus Dubin M.D.   On: 11/28/2016 10:39    Procedures Procedures (including critical care time)  Medications Ordered in ED Medications  acetaminophen (TYLENOL) tablet 650 mg (not administered)     Initial Impression / Assessment and Plan / ED Course  I have reviewed the triage vital signs and the nursing notes.  Pertinent labs & imaging results that were available during my care of the patient were reviewed by me and considered in my medical decision making (see chart for details).     Pt presents to the Ed with mother and care giver with vague complains of hip pain. When asked patient states "pain". He cannot give me any specifics. Patient history of autism.seen by PCP for same 3 days ago and given NSAID cream. Patient history of CML.denies any red flag symptoms of the concerning for cauda equina or epidural abscess.  Patient is neurovascularly intact in all extremities. Strength is normal in lower extremity. The caregiver did not want [place] to in the patient as it causes him pain.  Labwork reveals normal electrolytes. Normal magnesium and potassium. X-ray of hip shows degenerative changes and arthritis but no other abnormalities. Lumbar x-ray unremarkable. Ultrasound of lower chest region feels no DVT.  Unknown etiology of patient's lower extremity edema. Encourage stocking depressions at home. Close follow-up with PCP. Lungs are clear to auscultation. Doubt acute congestive heart failure. Patient does have mildly elevated kidney function however decreased from prior. Encouraged to avoid NSAIDs. We'll need close follow-up with PCP for recheck of kidney function and possible underlying etiology of lower extremity edema. This is like the cause of patient's pain. Encouraged elevation at home. No  signs of septic arthritis or joint. No signs of hemarthrosis.  Pt is hemodynamically stable, in NAD, & able to ambulate in the ED. Evaluation does not show pathology that would require ongoing emergent intervention or inpatient treatment. I explained the diagnosis to the patient. Pain has been managed & has no complaints prior to dc.  Pt is comfortable with above plan and is stable for discharge at this time. All questions were answered prior to disposition. Strict return precautions for f/u to the ED were discussed. Encouraged follow up with PCP.  Pt dicussed with Dr. Vanita Panda who is agreeable to the above plan.   Final Clinical Impressions(s) / ED Diagnoses   Final diagnoses:  Left hip pain  Peripheral edema    New Prescriptions New Prescriptions   CYCLOBENZAPRINE (FLEXERIL) 10 MG TABLET    Take 1 tablet (10 mg total) by mouth 3 (three) times daily as needed for muscle spasms.     Doristine Devoid, PA-C 11/29/16 2248    Carmin Muskrat, MD 11/30/16 [phone]

## 2016-11-28 NOTE — Discharge Instructions (Signed)
Your xray show arthritis of your hip. Your back xray is normal. There is no signs of blood clot in your legs. Please wear compression stockings or socks. Elevate her legs. Have given you a muscle relaxer to use for your back and hip pain. Your kidney function is also slightly elevated. When she follow-up with her primary care doctor this week to have this rechecked and to discuss your swelling in the legs.  Return to the ED if he has worsening pain, develops chest pain or shortness of breath.

## 2016-12-01 ENCOUNTER — Ambulatory Visit: Payer: Medicaid Other | Admitting: Oncology

## 2016-12-01 ENCOUNTER — Telehealth: Payer: Self-pay | Admitting: Oncology

## 2016-12-01 ENCOUNTER — Other Ambulatory Visit: Payer: Medicaid Other

## 2016-12-01 ENCOUNTER — Ambulatory Visit (HOSPITAL_BASED_OUTPATIENT_CLINIC_OR_DEPARTMENT_OTHER): Payer: Medicaid Other | Admitting: Oncology

## 2016-12-01 ENCOUNTER — Other Ambulatory Visit (HOSPITAL_BASED_OUTPATIENT_CLINIC_OR_DEPARTMENT_OTHER): Payer: Medicaid Other

## 2016-12-01 VITALS — BP 135/80 | HR 97 | Temp 98.6°F | Resp 20 | Ht 73.0 in

## 2016-12-01 DIAGNOSIS — M25559 Pain in unspecified hip: Secondary | ICD-10-CM

## 2016-12-01 DIAGNOSIS — D72819 Decreased white blood cell count, unspecified: Secondary | ICD-10-CM | POA: Diagnosis not present

## 2016-12-01 DIAGNOSIS — C9211 Chronic myeloid leukemia, BCR/ABL-positive, in remission: Secondary | ICD-10-CM

## 2016-12-01 DIAGNOSIS — C9210 Chronic myeloid leukemia, BCR/ABL-positive, not having achieved remission: Secondary | ICD-10-CM

## 2016-12-01 DIAGNOSIS — D649 Anemia, unspecified: Secondary | ICD-10-CM

## 2016-12-01 LAB — COMPREHENSIVE METABOLIC PANEL
ALT: 26 U/L (ref 0–55)
AST: 38 U/L — ABNORMAL HIGH (ref 5–34)
Albumin: 4 g/dL (ref 3.5–5.0)
Alkaline Phosphatase: 85 U/L (ref 40–150)
Anion Gap: 9 mEq/L (ref 3–11)
BUN: 26 mg/dL (ref 7.0–26.0)
CO2: 27 meq/L (ref 22–29)
Calcium: 9.9 mg/dL (ref 8.4–10.4)
Chloride: 105 mEq/L (ref 98–109)
Creatinine: 1.4 mg/dL — ABNORMAL HIGH (ref 0.7–1.3)
EGFR: 74 mL/min/{1.73_m2} — AB (ref >90)
GLUCOSE: 118 mg/dL (ref 70–140)
POTASSIUM: 4.3 meq/L (ref 3.5–5.1)
SODIUM: 141 meq/L (ref 136–145)
Total Bilirubin: 0.35 mg/dL (ref 0.20–1.20)
Total Protein: 7.4 g/dL (ref 6.4–8.3)

## 2016-12-01 LAB — CBC WITH DIFFERENTIAL/PLATELET
BASO%: 0 % (ref 0.0–2.0)
BASOS ABS: 0 10*3/uL (ref 0.0–0.1)
EOS ABS: 0.1 10*3/uL (ref 0.0–0.5)
EOS%: 2.4 % (ref 0.0–7.0)
HCT: 37.1 % — ABNORMAL LOW (ref 38.4–49.9)
HGB: 12.2 g/dL — ABNORMAL LOW (ref 13.0–17.1)
LYMPH%: 39.5 % (ref 14.0–49.0)
MCH: 33.4 pg (ref 27.2–33.4)
MCHC: 32.9 g/dL (ref 32.0–36.0)
MCV: 101.6 fL — AB (ref 79.3–98.0)
MONO#: 0.3 10*3/uL (ref 0.1–0.9)
MONO%: 8.6 % (ref 0.0–14.0)
NEUT%: 49.5 % (ref 39.0–75.0)
NEUTROS ABS: 1.8 10*3/uL (ref 1.5–6.5)
Platelets: 115 10*3/uL — ABNORMAL LOW (ref 140–400)
RBC: 3.65 10*6/uL — AB (ref 4.20–5.82)
RDW: 14.3 % (ref 11.0–14.6)
WBC: 3.7 10*3/uL — AB (ref 4.0–10.3)
lymph#: 1.5 10*3/uL (ref 0.9–3.3)

## 2016-12-01 NOTE — Progress Notes (Signed)
Hematology and Oncology Follow Up Visit  Darrell Russo [id number] August 09, 1975 41 y.o. 12/01/2016 8:55 AM  CC: Darrell Caroli, MD    Principle Diagnosis: This is a 41 year old gentleman with chronic phase CML diagnosed in 2007. He presented with leukocytosis and BCR-ABL positive transcripts in the peripheral blood.  Current therapy: He is on Gleevec 400 mg total dose.  He continues to be an hematological and molecular remission.  Interim History: Darrell Russo presents today for a followup visit accompanied by his mother. Since the last visit, no Reports feeling reasonably well. He is complaining of hip discomfort and lower extremity edema which has improved. He was evaluated in the emergency department on 11/28/2016 and laboratory testing and hip x-ray did not show any fractures. He continues to reside in a group home but spends the weekend at home with his mother. He has recently enrolled in a more active program which might have caused his bilateral hip discomfort. He continues to take [place] without any side effects. He denied any fluid retention or edema. He denied any GI toxicities including nausea or poor by mouth intake.  He does not report any headaches or double vision or seizures. He denied any new neurological symptoms. He does not report any chest pain, palpitation orthopnea. Does not report any cough, wheezing or dyspnea on exertion. He does not report any nausea, vomiting or abdominal pain. Does not report any urinary symptoms. He does not report any arthralgias or myalgias. Rest of his review of systems unremarkable.   Medications: I have reviewed the patient's current medications. Current Outpatient Prescriptions  Medication Sig Dispense Refill  . carbamazepine (ANTIPSYCHOTIC - EQUETRO) 200 MG CP12 Take 200 mg by mouth 2 (two) times daily.      . Cholecalciferol (VITAMIN D3) 1000 UNITS CAPS Take 1 capsule by mouth.    . cyclobenzaprine (FLEXERIL) 10 MG tablet Take 1 tablet (10 mg  total) by mouth 3 (three) times daily as needed for muscle spasms. 10 tablet 0  . divalproex (DEPAKOTE ER) 500 MG 24 hr tablet Take 2,500 mg by mouth daily.      . haloperidol (HALDOL) 1 MG tablet Take 1.5 mg by mouth 2 (two) times daily.     . hydrochlorothiazide (HYDRODIURIL) 25 MG tablet Take 25 mg by mouth daily.      Marland Kitchen imatinib (GLEEVEC) 100 MG tablet Take two pills twice daily with meals and large glass of water.Caution:Chemotherapy 120 tablet 1  . levothyroxine (LEVOTHROID) 300 MCG tablet Take 300 mcg by mouth daily.      Marland Kitchen liothyronine (CYTOMEL) 25 MCG tablet Take 25 mcg by mouth daily.      . metoprolol (TOPROL-XL) 50 MG 24 hr tablet Take 50 mg by mouth daily.      . minocycline (MINOCIN,DYNACIN) 100 MG capsule Take 100 mg by mouth 2 (two) times daily.      . mupirocin (BACTROBAN) 2 % ointment Apply topically 3 (three) times daily.      . naproxen (NAPROSYN) 500 MG tablet Take 500 mg by mouth 2 (two) times daily with a meal.      . QUEtiapine (SEROQUEL XR) 50 MG TB24 Take 200 mg by mouth.      . traMADol (ULTRAM) 50 MG tablet Take 50 mg by mouth every 6 (six) hours as needed. Maximum dose= 8 tablets per day     . triazolam (HALCION) 0.25 MG tablet Take 0.25 mg by mouth at bedtime as needed.      . zonisamide (  ZONEGRAN) 100 MG capsule Take 300 mg by mouth daily.       No current facility-administered medications for this visit.     Allergies: No Known Allergies  Past Medical History, Surgical history, Social history, and Family History were reviewed and updated.   Physical Exam: Blood pressure 135/80, pulse 97, temperature 98.6 F (37 C), temperature source Oral, resp. rate 20, height 6\' 1"  (1.854 m), SpO2 100 %. ECOG: 1 General appearance: Well-appearing gentleman without distress. Head: Normocephalic, without obvious abnormality no oral thrush or ulcers. Neck: no adenopathy Lymph nodes: Cervical, supraclavicular, and axillary nodes normal. Heart:regular rate and rhythm, S1,  S2 normal, no murmur, click, rub or gallop Lung:chest clear, no wheezing, rales, normal symmetric air entry Abdomin: soft, non-tender, without masses or organomegaly no shifting dullness or ascites EXT:no erythema, induration, or nodules. No edema noted.   Lab Results: Lab Results  Component Value Date   WBC 3.7 (L) 12/01/2016   HGB 12.2 (L) 12/01/2016   HCT 37.1 (L) 12/01/2016   MCV 101.6 (H) 12/01/2016   PLT 115 (L) 12/01/2016     Chemistry      Component Value Date/Time   NA 142 11/28/2016 1440   NA 141 04/21/2016 0757   K 3.8 11/28/2016 1440   K 3.7 04/21/2016 0757   CL 104 11/28/2016 1440   CL 98 05/28/2012 1453   CO2 26 04/21/2016 0757   BUN 23 (H) 11/28/2016 1440   BUN 27.8 (H) 04/21/2016 0757   CREATININE 1.30 (H) 11/28/2016 1440   CREATININE 1.6 (H) 04/21/2016 0757      Component Value Date/Time   CALCIUM 9.7 04/21/2016 0757   ALKPHOS 72 04/21/2016 0757   AST 44 (H) 04/21/2016 0757   ALT 42 04/21/2016 0757   BILITOT 0.50 04/21/2016 0757     BCR/ABL1: NOT DETECTED  February 2018.  Impression and Plan:  This is a 41 year old gentleman with the following issues. 1. Chronic phase CML.  He is on [place] since May 2007.  He continues to be with complete hematological and molecular remission.  His physical examination and laboratory data today did not reveal any evidence to suggest recurrent disease. His molecular testing obtained in February 2018 continue to show complete molecular remission. The plan is continued active surveillance and repeat this testing in May 2019.  2. Mild anemia and leukopenia. Likely related to [place] effect, but not enough to suggest or recommend changes in the Gleevec dose. We will continue to monitor. 3. 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 There is prescribed Flexeril by the emergency department. 4. Follow up: in May 2019.  Darrell Button, MD 10/5/20188:55 AM

## 2016-12-01 NOTE — Telephone Encounter (Signed)
Gave avs and calendar for may 2019 °

## 2016-12-12 ENCOUNTER — Telehealth: Payer: Self-pay

## 2016-12-12 ENCOUNTER — Inpatient Hospital Stay (HOSPITAL_COMMUNITY): Payer: Medicaid Other

## 2016-12-12 ENCOUNTER — Emergency Department (HOSPITAL_COMMUNITY): Payer: Medicaid Other

## 2016-12-12 ENCOUNTER — Inpatient Hospital Stay (HOSPITAL_COMMUNITY)
Admission: EM | Admit: 2016-12-12 | Discharge: 2016-12-18 | DRG: 481 | Disposition: A | Discharge status: Home Health | Payer: Medicaid Other | Attending: Internal Medicine | Admitting: Internal Medicine

## 2016-12-12 ENCOUNTER — Encounter (HOSPITAL_COMMUNITY): Payer: Self-pay

## 2016-12-12 DIAGNOSIS — K5900 Constipation, unspecified: Secondary | ICD-10-CM | POA: Diagnosis not present

## 2016-12-12 DIAGNOSIS — C9210 Chronic myeloid leukemia, BCR/ABL-positive, not having achieved remission: Secondary | ICD-10-CM | POA: Diagnosis not present

## 2016-12-12 DIAGNOSIS — F639 Impulse disorder, unspecified: Secondary | ICD-10-CM | POA: Diagnosis present

## 2016-12-12 DIAGNOSIS — Z6834 Body mass index (BMI) 34.0-34.9, adult: Secondary | ICD-10-CM | POA: Diagnosis not present

## 2016-12-12 DIAGNOSIS — W1830XA Fall on same level, unspecified, initial encounter: Secondary | ICD-10-CM | POA: Diagnosis present

## 2016-12-12 DIAGNOSIS — C9590 Leukemia, unspecified not having achieved remission: Secondary | ICD-10-CM

## 2016-12-12 DIAGNOSIS — Z01818 Encounter for other preprocedural examination: Secondary | ICD-10-CM

## 2016-12-12 DIAGNOSIS — S72041A Displaced fracture of base of neck of right femur, initial encounter for closed fracture: Secondary | ICD-10-CM | POA: Diagnosis present

## 2016-12-12 DIAGNOSIS — E039 Hypothyroidism, unspecified: Secondary | ICD-10-CM | POA: Diagnosis present

## 2016-12-12 DIAGNOSIS — Z23 Encounter for immunization: Secondary | ICD-10-CM

## 2016-12-12 DIAGNOSIS — S72001A Fracture of unspecified part of neck of right femur, initial encounter for closed fracture: Secondary | ICD-10-CM

## 2016-12-12 DIAGNOSIS — E669 Obesity, unspecified: Secondary | ICD-10-CM | POA: Diagnosis present

## 2016-12-12 DIAGNOSIS — D62 Acute posthemorrhagic anemia: Secondary | ICD-10-CM | POA: Diagnosis not present

## 2016-12-12 DIAGNOSIS — Y9289 Other specified places as the place of occurrence of the external cause: Secondary | ICD-10-CM

## 2016-12-12 DIAGNOSIS — M6282 Rhabdomyolysis: Secondary | ICD-10-CM | POA: Diagnosis not present

## 2016-12-12 DIAGNOSIS — I10 Essential (primary) hypertension: Secondary | ICD-10-CM | POA: Diagnosis present

## 2016-12-12 DIAGNOSIS — S72009A Fracture of unspecified part of neck of unspecified femur, initial encounter for closed fracture: Secondary | ICD-10-CM

## 2016-12-12 DIAGNOSIS — M25551 Pain in right hip: Secondary | ICD-10-CM | POA: Diagnosis present

## 2016-12-12 DIAGNOSIS — F840 Autistic disorder: Secondary | ICD-10-CM | POA: Diagnosis present

## 2016-12-12 DIAGNOSIS — C9211 Chronic myeloid leukemia, BCR/ABL-positive, in remission: Secondary | ICD-10-CM | POA: Diagnosis present

## 2016-12-12 DIAGNOSIS — Z419 Encounter for procedure for purposes other than remedying health state, unspecified: Secondary | ICD-10-CM

## 2016-12-12 DIAGNOSIS — R4189 Other symptoms and signs involving cognitive functions and awareness: Secondary | ICD-10-CM | POA: Diagnosis not present

## 2016-12-12 DIAGNOSIS — S72001D Fracture of unspecified part of neck of right femur, subsequent encounter for closed fracture with routine healing: Secondary | ICD-10-CM | POA: Diagnosis not present

## 2016-12-12 DIAGNOSIS — R000 Tachycardia, unspecified: Secondary | ICD-10-CM | POA: Diagnosis not present

## 2016-12-12 DIAGNOSIS — T148XXA Other injury of unspecified body region, initial encounter: Secondary | ICD-10-CM

## 2016-12-12 LAB — COMPREHENSIVE METABOLIC PANEL
ALBUMIN: 4.3 g/dL (ref 3.5–5.0)
ALK PHOS: 157 U/L — AB (ref 38–126)
ALT: 43 U/L (ref 17–63)
ANION GAP: 8 (ref 5–15)
AST: 136 U/L — ABNORMAL HIGH (ref 15–41)
BUN: 27 mg/dL — ABNORMAL HIGH (ref 6–20)
CALCIUM: 10.1 mg/dL (ref 8.9–10.3)
CO2: 27 mmol/L (ref 22–32)
CREATININE: 1.09 mg/dL (ref 0.61–1.24)
Chloride: 105 mmol/L (ref 101–111)
GFR calc Af Amer: >60 mL/min (ref >60)
GFR calc non Af Amer: >60 mL/min (ref >60)
GLUCOSE: 100 mg/dL — AB (ref 65–99)
Potassium: 4.2 mmol/L (ref 3.5–5.1)
SODIUM: 140 mmol/L (ref 135–145)
Total Bilirubin: 0.5 mg/dL (ref 0.3–1.2)
Total Protein: 7.7 g/dL (ref 6.5–8.1)

## 2016-12-12 LAB — CBC WITH DIFFERENTIAL/PLATELET
Basophils Absolute: 0 10*3/uL (ref 0.0–0.1)
Basophils Relative: 0 %
Eosinophils Absolute: 0 10*3/uL (ref 0.0–0.7)
Eosinophils Relative: 0 %
HEMATOCRIT: 33.6 % — AB (ref 39.0–52.0)
Hemoglobin: 11.4 g/dL — ABNORMAL LOW (ref 13.0–17.0)
LYMPHS PCT: 14 %
Lymphs Abs: 1.1 10*3/uL (ref 0.7–4.0)
MCH: 33.7 pg (ref 26.0–34.0)
MCHC: 33.9 g/dL (ref 30.0–36.0)
MCV: 99.4 fL (ref 78.0–100.0)
MONO ABS: 1.3 10*3/uL — AB (ref 0.1–1.0)
MONOS PCT: 15 %
NEUTROS ABS: 6.1 10*3/uL (ref 1.7–7.7)
Neutrophils Relative %: 71 %
Platelets: 190 10*3/uL (ref 150–400)
RBC: 3.38 MIL/uL — ABNORMAL LOW (ref 4.22–5.81)
RDW: 14.7 % (ref 11.5–15.5)
WBC: 8.5 10*3/uL (ref 4.0–10.5)

## 2016-12-12 LAB — TYPE AND SCREEN
ABO/RH(D): B POS
ABO/RH(D): B POS
ANTIBODY SCREEN: NEGATIVE
Antibody Screen: NEGATIVE

## 2016-12-12 LAB — ABO/RH
ABO/RH(D): B POS
ABO/RH(D): B POS

## 2016-12-12 LAB — PROTIME-INR
INR: 1.1
Prothrombin Time: 14.1 seconds (ref 11.4–15.2)

## 2016-12-12 MED ORDER — ZONISAMIDE 100 MG PO CAPS
300.0000 mg | ORAL_CAPSULE | Freq: Every day | ORAL | Status: DC
Start: 2016-12-13 — End: 2016-12-18
  Administered 2016-12-13 – 2016-12-17 (×5): 300 mg via ORAL
  Filled 2016-12-12 (×7): qty 3

## 2016-12-12 MED ORDER — DIVALPROEX SODIUM ER 500 MG PO TB24
2000.0000 mg | ORAL_TABLET | Freq: Every day | ORAL | Status: DC
  Administered 2016-12-13 – 2016-12-17 (×5): 2000 mg via ORAL
  Filled 2016-12-12 (×7): qty 4

## 2016-12-12 MED ORDER — IMATINIB MESYLATE 100 MG PO TABS
100.0000 mg | ORAL_TABLET | Freq: Two times a day (BID) | ORAL | Status: DC
  Administered 2016-12-12 – 2016-12-16 (×6): 100 mg via ORAL
  Filled 2016-12-12 (×5): qty 1

## 2016-12-12 MED ORDER — MORPHINE SULFATE (PF) 4 MG/ML IV SOLN
2.0000 mg | INTRAVENOUS | Status: DC | PRN
  Administered 2016-12-12 – 2016-12-14 (×6): 2 mg via INTRAVENOUS
  Filled 2016-12-12 (×7): qty 1

## 2016-12-12 MED ORDER — MORPHINE BOLUS VIA INFUSION: 2.0000 mg | INTRAVENOUS | Status: DC | PRN

## 2016-12-12 MED ORDER — MORPHINE SULFATE (PF) 4 MG/ML IV SOLN
4.0000 mg | INTRAVENOUS | Status: DC | PRN
  Administered 2016-12-12: 4 mg via INTRAVENOUS
  Filled 2016-12-12: qty 1

## 2016-12-12 MED ORDER — CYCLOBENZAPRINE HCL 10 MG PO TABS
10.0000 mg | ORAL_TABLET | Freq: Three times a day (TID) | ORAL | Status: DC | PRN
Start: 2016-12-12 — End: 2016-12-18
  Administered 2016-12-14 – 2016-12-17 (×4): 10 mg via ORAL
  Filled 2016-12-12 (×6): qty 1

## 2016-12-12 MED ORDER — INFLUENZA VAC SPLIT QUAD 0.5 ML IM SUSY
0.5000 mL | PREFILLED_SYRINGE | INTRAMUSCULAR | Status: AC
  Administered 2016-12-15: 0.5 mL via INTRAMUSCULAR
  Filled 2016-12-12: qty 0.5

## 2016-12-12 MED ORDER — MINOCYCLINE HCL 100 MG PO CAPS
100.0000 mg | ORAL_CAPSULE | Freq: Two times a day (BID) | ORAL | Status: DC
  Administered 2016-12-12 – 2016-12-18 (×11): 100 mg via ORAL
  Filled 2016-12-12 (×13): qty 1

## 2016-12-12 MED ORDER — METOPROLOL SUCCINATE ER 50 MG PO TB24
50.0000 mg | ORAL_TABLET | Freq: Every day | ORAL | Status: DC
  Administered 2016-12-13 – 2016-12-18 (×6): 50 mg via ORAL
  Filled 2016-12-12 (×6): qty 1

## 2016-12-12 MED ORDER — ACETAMINOPHEN 650 MG RE SUPP: 650.0000 mg | Freq: Four times a day (QID) | RECTAL | Status: DC | PRN

## 2016-12-12 MED ORDER — FUROSEMIDE 20 MG PO TABS
20.0000 mg | ORAL_TABLET | Freq: Every day | ORAL | Status: DC
  Administered 2016-12-13 – 2016-12-18 (×6): 20 mg via ORAL
  Filled 2016-12-12 (×6): qty 1

## 2016-12-12 MED ORDER — LEVOTHYROXINE SODIUM 75 MCG PO TABS
225.0000 ug | ORAL_TABLET | Freq: Every day | ORAL | Status: DC
  Administered 2016-12-13 – 2016-12-18 (×6): 225 ug via ORAL
  Filled 2016-12-12 (×6): qty 3

## 2016-12-12 MED ORDER — QUETIAPINE FUMARATE ER 400 MG PO TB24
800.0000 mg | ORAL_TABLET | Freq: Every day | ORAL | Status: DC
  Administered 2016-12-12 – 2016-12-17 (×6): 800 mg via ORAL
  Filled 2016-12-12 (×8): qty 2

## 2016-12-12 MED ORDER — ACETAMINOPHEN 325 MG PO TABS
650.0000 mg | ORAL_TABLET | Freq: Four times a day (QID) | ORAL | Status: DC | PRN
  Administered 2016-12-14 – 2016-12-17 (×5): 650 mg via ORAL
  Filled 2016-12-12 (×6): qty 2

## 2016-12-12 MED ORDER — HALOPERIDOL 0.5 MG PO TABS
1.5000 mg | ORAL_TABLET | Freq: Every day | ORAL | Status: DC
  Administered 2016-12-14 – 2016-12-18 (×5): 1.5 mg via ORAL
  Filled 2016-12-12 (×6): qty 1

## 2016-12-12 NOTE — Telephone Encounter (Signed)
Mother wanted to let Dr Alen Blew know that pt is at [facility] ED and will be transferred to [facility] for broken hip.

## 2016-12-12 NOTE — ED Notes (Signed)
Bed: WA20 Expected date:  Expected time:  Means of arrival:  Comments: Triage 7 

## 2016-12-12 NOTE — ED Notes (Signed)
Bed: WTR5 Expected date:  Expected time:  Means of arrival:  Comments: EMS-hip pain-triage

## 2016-12-12 NOTE — Progress Notes (Signed)
Call report to Danielle [phone] at 5:35 pm.

## 2016-12-12 NOTE — ED Notes (Signed)
Called [facility] for transport. ETA after 1900.

## 2016-12-12 NOTE — H&P (Signed)
Patient ID: Darrell Russo MRN: [id number] DOB/AGE: Jul 25, 1975 41 y.o.  Admit date: 12/12/2016  Admission Diagnoses:  Active Problems:   Hip fracture (HCC)   HPI: Darrell Russo 41 year old male pt with a past med history significant for Autism and Leukemia.  He presents to the ED today with significant pain of his right hip.  His family say he is unable to bear weight on the RLE and movement of that leg causes pain. The family report he has had pain for several weeks.  No trama was witnessed.  He does reside at a group home.  They say they were at the ED last week and were told he had arthritis.    Past Medical History: Past Medical History:  Diagnosis Date  . Autism   . Folliculitis   . Hypertension   . Impulse control disorder   . Leukemia ([place])   . TMJ (dislocation of temporomandibular joint)     Surgical History: Past Surgical History:  Procedure Laterality Date  . ANKLE SURGERY     l/ankle fracture 5 years ago    Family History: History reviewed. No pertinent family history.  Social History: Social History   Social History  . Marital status: Single    Spouse name: N/A  . Number of children: N/A  . Years of education: N/A   Occupational History  . Not on file.   Social History Main Topics  . Smoking status: Never Smoker  . Smokeless tobacco: Not on file  . Alcohol use No  . Drug use: No  . Sexual activity: Not on file   Other Topics Concern  . Not on file   Social History Narrative  . No narrative on file    Allergies: Patient has no known allergies.  Medications: I have reviewed the patient's current medications.  Vital Signs: Patient Vitals for the past 24 hrs:  BP Temp Temp src Pulse Resp SpO2  12/12/16 1600 134/87 - - 77 - 99 %  12/12/16 1500 135/90 - - 80 - 96 %  12/12/16 1445 - - - 85 - 98 %  12/12/16 1437 129/84 - - 86 - 99 %  12/12/16 1305 123/80 98.5 F (36.9 C) Oral 91 18 100 %    Radiology: Dg Lumbar Spine  Complete  Result Date: 11/28/2016 CLINICAL DATA:  Low back pain and lateral left hip pain for 3 days. No known injury. EXAM: LUMBAR SPINE - COMPLETE 4+ VIEW COMPARISON:  None. FINDINGS: No evidence of fracture, endplate erosion, or focal bone lesion. Borderline L5-S1 retrolisthesis. No degenerative disc narrowing or endplate spurring. There is levocurvature of the lumbar spine which could be positional or fixed. Excess re- ribs versus atypical lumbar segmentation with 4 lumbar type vertebral bodies. IMPRESSION: No acute or degenerative finding. Electronically Signed   By: Monte Fantasia M.D.   On: 11/28/2016 14:23   Ct Hip Right Wo Contrast  Result Date: 12/12/2016 CLINICAL DATA:  Right hip pain and tenderness to touch. The patient is being treated for leukemia. Unknown history of trauma. Initial encounter. EXAM: CT OF THE RIGHT HIP WITHOUT CONTRAST TECHNIQUE: Multidetector CT imaging of the right hip was performed according to the standard protocol. Multiplanar CT image reconstructions were also generated. COMPARISON:  Plain films right hip this same day. FINDINGS: Bones/Joint/Cartilage The patient has a mildly comminuted fracture through the base of the right femoral neck. The anterior fracture margins are distracted 1.4 cm. Slight posterior displacement of the intertrochanteric femur is identified.  The femoral head is located. No other fracture is seen. No focal bone lesion to suggest pathologic injury is identified. Ligaments Suboptimally assessed by CT. Muscles and Tendons Appear intact. Soft tissues Negative. IMPRESSION: Acute fracture through the base of the right femoral neck without imaging features to suggest pathologic injury. The exam is otherwise negative. Electronically Signed   By: Inge Rise M.D.   On: 12/12/2016 15:47   Dg Hip Unilat With Pelvis 2-3 Views Left  Result Date: 11/28/2016 CLINICAL DATA:  Left hip pain for the past 5 days.  No known injury. EXAM: DG HIP (WITH OR WITHOUT  PELVIS) 2-3V LEFT COMPARISON:  None. FINDINGS: No acute fracture or malalignment. Mild bilateral hip joint space narrowing. Bone mineralization is normal. Soft tissues are unremarkable. IMPRESSION: Mild bilateral hip joint degenerative changes. No acute osseous abnormality. Electronically Signed   By: Titus Dubin M.D.   On: 11/28/2016 10:39   Dg Hip Unilat  With Pelvis 2-3 Views Right  Result Date: 12/12/2016 CLINICAL DATA:  Pain with uncertain history of trauma. History of leukemia EXAM: DG HIP (WITH OR WITHOUT PELVIS) 2-3V RIGHT COMPARISON:  None. FINDINGS: Frontal pelvis as well as frontal and lateral right hip images were obtained. There is a basicervical region fracture of the right femur with varus angulation of the fracture site. A small fracture fragment is displaced medially. No other fracture is evident. No dislocation. There is slight symmetric narrowing of both hip joints. IMPRESSION: Comminuted basicervical fracture of the proximal right femur with varus angulation at the fracture site. No other fracture. No dislocation. There is mild symmetric narrowing of both hip joints. Electronically Signed   By: Lowella Grip III M.D.   On: 12/12/2016 13:37    Labs:  Recent Labs  12/12/16 1423  WBC 8.5  RBC 3.38*  HCT 33.6*  PLT 190    Recent Labs  12/12/16 1423  NA 140  K 4.2  CL 105  CO2 27  BUN 27*  CREATININE 1.09  GLUCOSE 100*  CALCIUM 10.1    Recent Labs  12/12/16 1423  INR 1.10    Review of Systems: ROS  Physical Exam: Neurologically intact ABD soft Sensation intact distally Intact pulses distally Dorsiflexion/Plantar flexion intact Compartment soft  TTP of the right hip joint  Movement of the RLE elicits pain  Assessment and Plan: Dr. Rolena Infante Consulted Dr. Lyla Glassing Hip CT was ordered by Dr. Lyla Glassing Pt will be transferred to [facility] for pending surgery Pt will remain NPO  Ronette Deter, PAC for [name], MD [place] [phone]  Agree with above. I have personally discussed the films in the case with Dr. Lyla Glassing. He will evaluate the patient today to determine appropriate surgical management.

## 2016-12-12 NOTE — ED Provider Notes (Signed)
[facility] DEPT Provider Note   CSN: [id number] Arrival date & time: 12/12/16  1234     History   Chief Complaint Chief Complaint  Patient presents with  . Hip Pain    HPI Darrell Russo is a 41 y.o. male with a history of autism, chronic CML who presents for evaluation of right hip pain. His mother reports that he has been experiencing hip pain since 10/2. Unsure of any trauma history. He had x-rays obtained 10/2 which did not show any is acute abnormalities. The x-rays were again reviewed by his oncologist on 10/5.  Today his mother reports that she received a call from the group home where he resides stating that he attempted to stand up however then immediately sat down on the floor and has been unable to bear weight on his right leg since. He screams whenever anyone tries to touch him or move his leg.  Mom says that this is significantly worse from his previous.  Last oral intake is breakfast this morning, unsure of exact timing.  HPI  Past Medical History:  Diagnosis Date  . Autism   . Folliculitis   . Hypertension   . Impulse control disorder   . Leukemia ([place])   . TMJ (dislocation of temporomandibular joint)     Patient Active Problem List   Diagnosis Date Noted  . GI BLEEDING 06/18/2008    Past Surgical History:  Procedure Laterality Date  . ANKLE SURGERY     l/ankle fracture 5 years ago       Home Medications    Prior to Admission medications   Medication Sig Start Date End Date Taking? Authorizing Provider  carbamazepine (ANTIPSYCHOTIC - EQUETRO) 200 MG CP12 Take 200 mg by mouth 2 (two) times daily.      [provider]  Cholecalciferol (VITAMIN D3) 1000 UNITS CAPS Take 1 capsule by mouth.    [provider]  cyclobenzaprine (FLEXERIL) 10 MG tablet Take 1 tablet (10 mg total) by mouth 3 (three) times daily as needed for muscle spasms. 11/28/16   Doristine Devoid, PA-C  divalproex (DEPAKOTE ER) 500 MG  24 hr tablet Take 2,500 mg by mouth daily.      [provider]  haloperidol (HALDOL) 1 MG tablet Take 1.5 mg by mouth 2 (two) times daily.     [provider]  hydrochlorothiazide (HYDRODIURIL) 25 MG tablet Take 25 mg by mouth daily.      [provider]  imatinib (GLEEVEC) 100 MG tablet Take two pills twice daily with meals and large glass of water.Caution:Chemotherapy 10/25/15   Wyatt Portela, MD  levothyroxine (LEVOTHROID) 300 MCG tablet Take 300 mcg by mouth daily.      [provider]  liothyronine (CYTOMEL) 25 MCG tablet Take 25 mcg by mouth daily.      [provider]  metoprolol (TOPROL-XL) 50 MG 24 hr tablet Take 50 mg by mouth daily.      [provider]  minocycline (MINOCIN,DYNACIN) 100 MG capsule Take 100 mg by mouth 2 (two) times daily.      [provider]  mupirocin (BACTROBAN) 2 % ointment Apply topically 3 (three) times daily.      [provider]  naproxen (NAPROSYN) 500 MG tablet Take 500 mg by mouth 2 (two) times daily with a meal.      [provider]  QUEtiapine (SEROQUEL XR) 50 MG TB24 Take 200 mg by mouth.  [provider]  traMADol (ULTRAM) 50 MG tablet Take 50 mg by mouth every 6 (six) hours as needed. Maximum dose= 8 tablets per day     [provider]  triazolam (HALCION) 0.25 MG tablet Take 0.25 mg by mouth at bedtime as needed.      [provider]  zonisamide (ZONEGRAN) 100 MG capsule Take 300 mg by mouth daily.      [provider]    Family History History reviewed. No pertinent family history.  Social History Social History  Substance Use Topics  . Smoking status: Never Smoker  . Smokeless tobacco: Not on file  . Alcohol use No     Allergies   Patient has no known allergies.   Review of Systems Review of Systems  Unable to perform ROS: Other   Autism, scripting behavior  Physical Exam Updated Vital Signs BP 123/80 (BP  Location: Right Arm)   Pulse 91   Temp 98.5 F (36.9 C) (Oral)   Resp 18   SpO2 100%   Physical Exam  Constitutional: He appears well-developed and well-nourished. No distress.  HENT:  Head: Normocephalic and atraumatic.  Eyes: Conjunctivae are normal.  Neck: Neck supple.  Cardiovascular: Normal rate, regular rhythm, normal heart sounds and intact distal pulses.   No murmur heard. Pulmonary/Chest: Effort normal and breath sounds normal. No respiratory distress.  Abdominal: Soft. Bowel sounds are normal. He exhibits no distension. There is no tenderness.  Musculoskeletal:  Swelling over right lateral hip with TTP over entire hip.  When attempting to move hip slightly patient screams in pain.  Patient is able to wiggle toes.  Right leg does not appear shortened or rotated. No TTP over right knee or ankle.   Neurological: He is alert.  At normal baseline per mother  Skin: Skin is warm and dry. He is not diaphoretic.  Psychiatric: He has a normal mood and affect.  Nursing note and vitals reviewed.    ED Treatments / Results  Labs (all labs ordered are listed, but only abnormal results are displayed) Labs Reviewed  CBC WITH DIFFERENTIAL/PLATELET  PROTIME-INR  COMPREHENSIVE METABOLIC PANEL  TYPE AND SCREEN    EKG  EKG Interpretation None       Radiology Ct Hip Right Wo Contrast  Result Date: 12/12/2016 CLINICAL DATA:  Right hip pain and tenderness to touch. The patient is being treated for leukemia. Unknown history of trauma. Initial encounter. EXAM: CT OF THE RIGHT HIP WITHOUT CONTRAST TECHNIQUE: Multidetector CT imaging of the right hip was performed according to the standard protocol. Multiplanar CT image reconstructions were also generated. COMPARISON:  Plain films right hip this same day. FINDINGS: Bones/Joint/Cartilage The patient has a mildly comminuted fracture through the base of the right femoral neck. The anterior fracture margins are distracted 1.4 cm. Slight  posterior displacement of the intertrochanteric femur is identified. The femoral head is located. No other fracture is seen. No focal bone lesion to suggest pathologic injury is identified. Ligaments Suboptimally assessed by CT. Muscles and Tendons Appear intact. Soft tissues Negative. IMPRESSION: Acute fracture through the base of the right femoral neck without imaging features to suggest pathologic injury. The exam is otherwise negative. Electronically Signed   By: Inge Rise M.D.   On: 12/12/2016 15:47   Dg Chest Port 1 View  Result Date: 12/12/2016 CLINICAL DATA:  Leukemia.  Preoperative evaluation for hip fracture. EXAM: PORTABLE CHEST 1 VIEW COMPARISON:  April 12, 2009 FINDINGS: Lungs are clear. Heart size and  pulmonary vascularity are normal. No adenopathy. No bone lesions. IMPRESSION: No evident adenopathy.  No edema or consolidation. Electronically Signed   By: Lowella Grip III M.D.   On: 12/12/2016 18:24   Dg Hip Unilat  With Pelvis 2-3 Views Right  Result Date: 12/12/2016 CLINICAL DATA:  Pain with uncertain history of trauma. History of leukemia EXAM: DG HIP (WITH OR WITHOUT PELVIS) 2-3V RIGHT COMPARISON:  None. FINDINGS: Frontal pelvis as well as frontal and lateral right hip images were obtained. There is a basicervical region fracture of the right femur with varus angulation of the fracture site. A small fracture fragment is displaced medially. No other fracture is evident. No dislocation. There is slight symmetric narrowing of both hip joints. IMPRESSION: Comminuted basicervical fracture of the proximal right femur with varus angulation at the fracture site. No other fracture. No dislocation. There is mild symmetric narrowing of both hip joints. Electronically Signed   By: Lowella Grip III M.D.   On: 12/12/2016 13:37    Procedures Procedures (including critical care time)  Medications Ordered in ED Medications  morphine 4 MG/ML injection 4 mg (not administered)      Initial Impression / Assessment and Plan / ED Course  I have reviewed the triage vital signs and the nursing notes.  Pertinent labs & imaging results that were available during my care of the patient were reviewed by me and considered in my medical decision making (see chart for details).  Clinical Course as of Dec 12 1953  Tue Dec 12, 2016  1410 Spoke with Ortho Dr. Rolena Infante who will call back, considering [facility].   [EH]  39 Per Dr. Rolena Infante, CT scan of hip, transfer to [facility], admit to medicine  Page Orion Crook the or the trauma PA once he gets to count.  [EH]  81 Mother updated on plan and results.  Patient is resting comfortably after morphine.   [EH]    Clinical Course User Index [EH] Lorin Glass, PA-C    Zacarias Pontes presents for evaluation of worsening right hip pain and new onset inability to bear weight on his right leg.  X-rays were obtained which showed an acute basicervical fracture of the right femur.  I consulted orthopedics and spoke with Dr. Dietrich Pates who requested a CT scan to better characterize the fracture, and transfer to [facility] due to increased OR availability.  His pain was treated with morphine which provided adequate relief.  CT scan was obtained.  Patient and mother were informed of plans to transfer to [facility].  I consulted hospitalist Dr. Florene Glen for admission who agreed to admit the patient.    The patient appears reasonably stabilized for admission considering the current resources, flow, and capabilities available in the ED at this time, and I doubt any other [facility] requiring further screening and/or treatment in the ED prior to admission.  This patient was seen as a shared visit with Dr. Kathrynn Humble who evaluated the patient and agreed with my plan.   Final Clinical Impressions(s) / ED Diagnoses   Final diagnoses:  Preop examination    New Prescriptions New Prescriptions   No medications on file     Ollen Gross 12/12/16 [place], [place], MD 12/13/16 [id number]

## 2016-12-12 NOTE — H&P (Addendum)
History and Physical    [name] VEH:[id number] DOB: 10/22/1975 DOA: 12/12/2016  PCP: Aletha Halim., PA-C  Patient coming from: home  I have personally briefly reviewed patient's old medical records in [place]  Chief Complaint: R hip pain  HPI: Darrell Russo is Darrell Russo 41 y.o. male with medical history significant of autism, HTN, leukemia presenting with Darrell Russo R hip fracture.  Patient has had hip pain for the past 2 weeks. He presented to the emergency department [facility] few weeks ago and had imaging that was only notable for degenerative changes to the hip.  Patient's pain started Yana Schorr few weeks ago. He did complain of pain to the right hip. Today he was unable to walk. He's been walking with Meisha Salone walker recently. He's had no falls or injuries. Review of systems limited, but the patient has no other complaints per his mother.  ED Course: Labs, xray, ortho consult.  Admit to [facility] for hip frx.  Review of Systems: As per HPI otherwise 10 point review of systems negative.    Past Medical History:  Diagnosis Date  . Autism   . Folliculitis   . Hypertension   . Impulse control disorder   . Leukemia ([place])   . TMJ (dislocation of temporomandibular joint)     Past Surgical History:  Procedure Laterality Date  . ANKLE SURGERY     l/ankle fracture 5 years ago     reports that he has never smoked. He does not have any smokeless tobacco history on file. He reports that he does not drink alcohol or use drugs.  No Known Allergies  History reviewed. No pertinent family history.  Prior to Admission medications   Medication Sig Start Date End Date Taking? Authorizing Provider  Cholecalciferol (VITAMIN D3) 1000 UNITS CAPS Take 1,000 Units by mouth daily.    Yes [provider]  cyclobenzaprine (FLEXERIL) 10 MG tablet Take 1 tablet (10 mg total) by mouth 3 (three) times daily as needed for muscle spasms. 11/28/16  Yes Ocie Cornfield T, PA-C  diclofenac sodium (VOLTAREN) 1 % GEL  Apply topically 4 (four) times daily.   Yes [provider]  divalproex (DEPAKOTE ER) 500 MG 24 hr tablet Take 2,000 mg by mouth daily.    Yes [provider]  furosemide (LASIX) 20 MG tablet Take 20 mg by mouth daily.   Yes [provider]  haloperidol (HALDOL) 1 MG tablet Take 1.5 mg by mouth daily.    Yes [provider]  imatinib (GLEEVEC) 100 MG tablet Take two pills twice daily with meals and large glass of water.Caution:Chemotherapy 10/25/15  Yes Wyatt Portela, MD  levothyroxine (LEVOTHROID) 300 MCG tablet Take 225 mcg by mouth daily.    Yes [provider]  liothyronine (CYTOMEL) 25 MCG tablet Take 25 mcg by mouth daily.     Yes [provider]  meloxicam (MOBIC) 15 MG tablet Take 15 mg by mouth daily.   Yes [provider]  metoprolol (TOPROL-XL) 50 MG 24 hr tablet Take 50 mg by mouth daily.     Yes [provider]  minocycline (MINOCIN,DYNACIN) 100 MG capsule Take 100 mg by mouth 2 (two) times daily.     Yes [provider]  mupirocin (BACTROBAN) 2 % ointment Apply topically 3 (three) times daily.     Yes [provider]  nystatin cream (MYCOSTATIN) Apply 1 application topically at bedtime.   Yes [provider]  QUEtiapine (SEROQUEL XR) 200 MG  24 hr tablet Take 800 mg by mouth at bedtime.    Yes [provider]  traMADol (ULTRAM) 50 MG tablet Take 50 mg by mouth every 6 (six) hours as needed. Maximum dose= 8 tablets per day    Yes [provider]  zonisamide (ZONEGRAN) 100 MG capsule Take 300 mg by mouth daily.     Yes [provider]    Physical Exam: Vitals:   12/12/16 1500 12/12/16 1600 12/12/16 1804 12/12/16 2036  BP: 135/90 134/87 (!) 147/83 136/88  Pulse: 80 77 92 84  Resp:   16 20  Temp:    98.8 F (37.1 C)  TempSrc:    Oral  SpO2: 96% 99% 98% 100%    Constitutional: NAD, calm, comfortable Vitals:   12/12/16 1500 12/12/16 1600 12/12/16 1804  12/12/16 2036  BP: 135/90 134/87 (!) 147/83 136/88  Pulse: 80 77 92 84  Resp:   16 20  Temp:    98.8 F (37.1 C)  TempSrc:    Oral  SpO2: 96% 99% 98% 100%   Eyes: PERRL, lids and conjunctivae normal ENMT: Mucous membranes are moist. Posterior pharynx clear of any exudate or lesions.Normal dentition.  Neck: normal, supple, no masses, no thyromegaly Respiratory: clear to auscultation bilaterally, no wheezing, no crackles. Normal respiratory effort. No accessory muscle use.  Cardiovascular: Regular rate and rhythm, no murmurs / rubs / gallops. 1+ extremity edema. 2+ pedal pulses.  Abdomen: no tenderness, no masses palpated. No hepatosplenomegaly. Bowel sounds positive.  Musculoskeletal: TTP to R hip  Skin: no rashes, lesions, ulcers. No induration Neurologic: CN 2-12 grossly intact. Sensation intact, DTR normal. Moving all extremites Psychiatric: Impaired judgment and insight.  Says words occasionally, repeats what I say.  Labs on Admission: I have personally reviewed following labs and imaging studies  CBC:  Recent Labs Lab 12/12/16 1423  WBC 8.5  NEUTROABS 6.1  HGB 11.4*  HCT 33.6*  MCV 99.4  PLT 500   Basic Metabolic Panel:  Recent Labs Lab 12/12/16 1423  NA 140  K 4.2  CL 105  CO2 27  GLUCOSE 100*  BUN 27*  CREATININE 1.09  CALCIUM 10.1   GFR: CrCl cannot be calculated (Unknown ideal weight.). Liver Function Tests:  Recent Labs Lab 12/12/16 1423  AST 136*  ALT 43  ALKPHOS 157*  BILITOT 0.5  PROT 7.7  ALBUMIN 4.3   No results for input(s): LIPASE, AMYLASE in the last 168 hours. No results for input(s): AMMONIA in the last 168 hours. Coagulation Profile:  Recent Labs Lab 12/12/16 1423  INR 1.10   Cardiac Enzymes: No results for input(s): CKTOTAL, CKMB, CKMBINDEX, TROPONINI in the last 168 hours. BNP (last 3 results) No results for input(s): PROBNP in the last 8760 hours. HbA1C: No results for input(s): HGBA1C in the last 72 hours. CBG: No  results for input(s): GLUCAP in the last 168 hours. Lipid Profile: No results for input(s): CHOL, HDL, LDLCALC, TRIG, CHOLHDL, LDLDIRECT in the last 72 hours. Thyroid Function Tests: No results for input(s): TSH, T4TOTAL, FREET4, T3FREE, THYROIDAB in the last 72 hours. Anemia Panel: No results for input(s): VITAMINB12, FOLATE, FERRITIN, TIBC, IRON, RETICCTPCT in the last 72 hours. Urine analysis:    Component Value Date/Time   LABSPEC 1.015 03/10/2008 1357   PHURINE 7.0 03/10/2008 1357   HGBUR Negative 03/10/2008 1357   BILIRUBINUR Negative 03/10/2008 1357   KETONESUR Negative 03/10/2008 1357   PROTEINUR Negative 03/10/2008 1357   NITRITE Negative 03/10/2008 1357   LEUKOCYTESUR  Negative 03/10/2008 1357    Radiological Exams on Admission: Ct Hip Right Wo Contrast  Result Date: 12/12/2016 CLINICAL DATA:  Right hip pain and tenderness to touch. The patient is being treated for leukemia. Unknown history of trauma. Initial encounter. EXAM: CT OF THE RIGHT HIP WITHOUT CONTRAST TECHNIQUE: Multidetector CT imaging of the right hip was performed according to the standard protocol. Multiplanar CT image reconstructions were also generated. COMPARISON:  Plain films right hip this same day. FINDINGS: Bones/Joint/Cartilage The patient has Lanasia Porras mildly comminuted fracture through the base of the right femoral neck. The anterior fracture margins are distracted 1.4 cm. Slight posterior displacement of the intertrochanteric femur is identified. The femoral head is located. No other fracture is seen. No focal bone lesion to suggest pathologic injury is identified. Ligaments Suboptimally assessed by CT. Muscles and Tendons Appear intact. Soft tissues Negative. IMPRESSION: Acute fracture through the base of the right femoral neck without imaging features to suggest pathologic injury. The exam is otherwise negative. Electronically Signed   By: Inge Rise M.D.   On: 12/12/2016 15:47   Dg Chest Port 1  View  Result Date: 12/12/2016 CLINICAL DATA:  Leukemia.  Preoperative evaluation for hip fracture. EXAM: PORTABLE CHEST 1 VIEW COMPARISON:  April 12, 2009 FINDINGS: Lungs are clear. Heart size and pulmonary vascularity are normal. No adenopathy. No bone lesions. IMPRESSION: No evident adenopathy.  No edema or consolidation. Electronically Signed   By: Lowella Grip III M.D.   On: 12/12/2016 18:24   Dg Hip Unilat  With Pelvis 2-3 Views Right  Result Date: 12/12/2016 CLINICAL DATA:  Pain with uncertain history of trauma. History of leukemia EXAM: DG HIP (WITH OR WITHOUT PELVIS) 2-3V RIGHT COMPARISON:  None. FINDINGS: Frontal pelvis as well as frontal and lateral right hip images were obtained. There is Dartagnan Beavers basicervical region fracture of the right femur with varus angulation of the fracture site. Jhoel Stieg small fracture fragment is displaced medially. No other fracture is evident. No dislocation. There is slight symmetric narrowing of both hip joints. IMPRESSION: Comminuted basicervical fracture of the proximal right femur with varus angulation at the fracture site. No other fracture. No dislocation. There is mild symmetric narrowing of both hip joints. Electronically Signed   By: Lowella Grip III M.D.   On: 12/12/2016 13:37    EKG: Independently reviewed. Appears similar to priors.  IVCD. NSR.   Assessment/Plan Active Problems:   Hip fracture (HCC)  Right Hip fracture: CT with acute fracture through the base of the right femoral neck. Imaging was with out features to suggest pathologic injury. Unclear cause as he's had no trauma. 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  Continue Gleevec (discussed with on call, [place])  Autism  Impulse Control Disorder:  Continue seroquel, depakote, haldol, zonisamide  HTN: continue lasix,  metop  Hypothyroidism: continue synthroid 225 mcg daily  Anemia: ctm  Elevated alk phos  AST: f/u repeat labs  DVT prophylaxis: SCD Code Status: full  Family Communication: mom in room Disposition Plan: pending  Consults called: ED discussed with ortho, phone with Benay Spice from onc Admission status: inpatient   Fayrene Helper MD [facility] Pager [phone]   If 7PM-7AM, please contact night-coverage [email or web address] Password [facility]  12/12/2016, 9:33 PM

## 2016-12-12 NOTE — ED Triage Notes (Addendum)
Patient with history of autism. Patient with history of Leukemia and being treated at [facility]. Patient complaining of right hip pain- patient screams when right hip is touched. Patient lives in a group home. Today, per report, patient attempted to stand and had to immediately sit on the floor. Left leg edema noted by EMS. Patient caretaker at bedside- states that early onset arthritis runs in the family, but unsure if that is the patient's problem.

## 2016-12-12 NOTE — ED Notes (Signed)
Patient transported to CT 

## 2016-12-13 ENCOUNTER — Inpatient Hospital Stay (HOSPITAL_COMMUNITY): Payer: Medicaid Other

## 2016-12-13 ENCOUNTER — Inpatient Hospital Stay (HOSPITAL_COMMUNITY): Payer: Medicaid Other | Admitting: Registered Nurse

## 2016-12-13 ENCOUNTER — Encounter (HOSPITAL_COMMUNITY): Payer: Self-pay | Admitting: *Deleted

## 2016-12-13 ENCOUNTER — Encounter (HOSPITAL_COMMUNITY)
Admission: EM | Disposition: A | Discharge status: Home Health | Payer: Self-pay | Source: Home / Self Care | Attending: Internal Medicine

## 2016-12-13 DIAGNOSIS — S72001D Fracture of unspecified part of neck of right femur, subsequent encounter for closed fracture with routine healing: Secondary | ICD-10-CM

## 2016-12-13 DIAGNOSIS — I10 Essential (primary) hypertension: Secondary | ICD-10-CM

## 2016-12-13 DIAGNOSIS — C9210 Chronic myeloid leukemia, BCR/ABL-positive, not having achieved remission: Secondary | ICD-10-CM

## 2016-12-13 HISTORY — PX: ORIF HIP FRACTURE: SHX2125

## 2016-12-13 LAB — COMPREHENSIVE METABOLIC PANEL
ALBUMIN: 3.4 g/dL — AB (ref 3.5–5.0)
ALK PHOS: 120 U/L (ref 38–126)
ALT: 35 U/L (ref 17–63)
AST: 109 U/L — ABNORMAL HIGH (ref 15–41)
Anion gap: 9 (ref 5–15)
BILIRUBIN TOTAL: 0.4 mg/dL (ref 0.3–1.2)
BUN: 24 mg/dL — AB (ref 6–20)
CO2: 25 mmol/L (ref 22–32)
CREATININE: 1.17 mg/dL (ref 0.61–1.24)
Calcium: 9.1 mg/dL (ref 8.9–10.3)
Chloride: 104 mmol/L (ref 101–111)
GFR calc Af Amer: >60 mL/min (ref >60)
GFR calc non Af Amer: >60 mL/min (ref >60)
GLUCOSE: 88 mg/dL (ref 65–99)
POTASSIUM: 3.9 mmol/L (ref 3.5–5.1)
Sodium: 138 mmol/L (ref 135–145)
TOTAL PROTEIN: 6.1 g/dL — AB (ref 6.5–8.1)

## 2016-12-13 LAB — CBC
HEMATOCRIT: 31.4 % — AB (ref 39.0–52.0)
HEMOGLOBIN: 10.6 g/dL — AB (ref 13.0–17.0)
MCH: 33.3 pg (ref 26.0–34.0)
MCHC: 33.8 g/dL (ref 30.0–36.0)
MCV: 98.7 fL (ref 78.0–100.0)
Platelets: 170 10*3/uL (ref 150–400)
RBC: 3.18 MIL/uL — AB (ref 4.22–5.81)
RDW: 14.6 % (ref 11.5–15.5)
WBC: 7.2 10*3/uL (ref 4.0–10.5)

## 2016-12-13 LAB — SURGICAL PCR SCREEN
MRSA, PCR: NEGATIVE
Staphylococcus aureus: NEGATIVE

## 2016-12-13 LAB — PROTIME-INR
INR: 1.13
Prothrombin Time: 14.4 seconds (ref 11.4–15.2)

## 2016-12-13 LAB — HIV ANTIBODY (ROUTINE TESTING W REFLEX): HIV Screen 4th Generation wRfx: NONREACTIVE

## 2016-12-13 LAB — APTT: APTT: 32 s (ref 24–36)

## 2016-12-13 SURGERY — OPEN REDUCTION INTERNAL FIXATION HIP
Anesthesia: General | Site: Hip | Laterality: Right

## 2016-12-13 MED ORDER — SUCCINYLCHOLINE CHLORIDE 200 MG/10ML IV SOSY
PREFILLED_SYRINGE | INTRAVENOUS | Status: DC | PRN
  Administered 2016-12-13: 120 mg via INTRAVENOUS

## 2016-12-13 MED ORDER — SUGAMMADEX SODIUM 200 MG/2ML IV SOLN
INTRAVENOUS | Status: DC | PRN
  Administered 2016-12-13: 250 mg via INTRAVENOUS

## 2016-12-13 MED ORDER — 0.9 % SODIUM CHLORIDE (POUR BTL) OPTIME
TOPICAL | Status: DC | PRN
  Administered 2016-12-13: 1000 mL

## 2016-12-13 MED ORDER — OXYCODONE HCL 5 MG PO TABS: 5.0000 mg | ORAL_TABLET | Freq: Once | ORAL | Status: DC | PRN

## 2016-12-13 MED ORDER — PHENYLEPHRINE HCL 10 MG/ML IJ SOLN
INTRAMUSCULAR | Status: DC | PRN
  Administered 2016-12-13: 50 ug/min via INTRAVENOUS

## 2016-12-13 MED ORDER — HYDROMORPHONE HCL 1 MG/ML IJ SOLN
0.2500 mg | INTRAMUSCULAR | Status: DC | PRN
  Administered 2016-12-13: 0.5 mg via INTRAVENOUS

## 2016-12-13 MED ORDER — FENTANYL CITRATE (PF) 250 MCG/5ML IJ SOLN
INTRAMUSCULAR | Status: AC
  Filled 2016-12-13: qty 5

## 2016-12-13 MED ORDER — MEPERIDINE HCL 25 MG/ML IJ SOLN: 6.2500 mg | INTRAMUSCULAR | Status: DC | PRN

## 2016-12-13 MED ORDER — MIDAZOLAM HCL 2 MG/2ML IJ SOLN
INTRAMUSCULAR | Status: DC | PRN
  Administered 2016-12-13: 2 mg via INTRAVENOUS

## 2016-12-13 MED ORDER — LIDOCAINE 2% (20 MG/ML) 5 ML SYRINGE
INTRAMUSCULAR | Status: DC | PRN
  Administered 2016-12-13: 60 mg via INTRAVENOUS

## 2016-12-13 MED ORDER — CEFAZOLIN SODIUM-DEXTROSE 2-4 GM/100ML-% IV SOLN
2.0000 g | Freq: Three times a day (TID) | INTRAVENOUS | Status: AC
  Administered 2016-12-14 (×3): 2 g via INTRAVENOUS
  Filled 2016-12-13 (×3): qty 100

## 2016-12-13 MED ORDER — HYDROMORPHONE HCL 1 MG/ML IJ SOLN
INTRAMUSCULAR | Status: AC
  Filled 2016-12-13: qty 1

## 2016-12-13 MED ORDER — ALBUMIN HUMAN 5 % IV SOLN
INTRAVENOUS | Status: DC | PRN
  Administered 2016-12-13: 19:00:00 via INTRAVENOUS

## 2016-12-13 MED ORDER — PHENYLEPHRINE 40 MCG/ML (10ML) SYRINGE FOR IV PUSH (FOR BLOOD PRESSURE SUPPORT)
PREFILLED_SYRINGE | INTRAVENOUS | Status: DC | PRN
  Administered 2016-12-13 (×2): 80 ug via INTRAVENOUS
  Administered 2016-12-13: 160 ug via INTRAVENOUS
  Administered 2016-12-13: 80 ug via INTRAVENOUS

## 2016-12-13 MED ORDER — PROMETHAZINE HCL 25 MG/ML IJ SOLN: 6.2500 mg | INTRAMUSCULAR | Status: DC | PRN

## 2016-12-13 MED ORDER — PROPOFOL 10 MG/ML IV BOLUS
INTRAVENOUS | Status: DC | PRN
  Administered 2016-12-13: 200 mg via INTRAVENOUS

## 2016-12-13 MED ORDER — ROCURONIUM BROMIDE 10 MG/ML (PF) SYRINGE
PREFILLED_SYRINGE | INTRAVENOUS | Status: DC | PRN
  Administered 2016-12-13: 10 mg via INTRAVENOUS
  Administered 2016-12-13: 50 mg via INTRAVENOUS
  Administered 2016-12-13: 20 mg via INTRAVENOUS

## 2016-12-13 MED ORDER — ONDANSETRON HCL 4 MG/2ML IJ SOLN
INTRAMUSCULAR | Status: DC | PRN
  Administered 2016-12-13: 4 mg via INTRAVENOUS

## 2016-12-13 MED ORDER — LACTATED RINGERS IV SOLN
INTRAVENOUS | Status: DC
  Administered 2016-12-13: 17:00:00 via INTRAVENOUS

## 2016-12-13 MED ORDER — MIDAZOLAM HCL 2 MG/2ML IJ SOLN
INTRAMUSCULAR | Status: AC
  Filled 2016-12-13: qty 2

## 2016-12-13 MED ORDER — CEFAZOLIN SODIUM-DEXTROSE 2-4 GM/100ML-% IV SOLN
2.0000 g | INTRAVENOUS | Status: AC
  Administered 2016-12-13: 2 g via INTRAVENOUS
  Filled 2016-12-13 (×2): qty 100

## 2016-12-13 MED ORDER — LACTATED RINGERS IV SOLN
INTRAVENOUS | Status: DC | PRN
  Administered 2016-12-13 (×2): via INTRAVENOUS

## 2016-12-13 MED ORDER — PROPOFOL 10 MG/ML IV BOLUS
INTRAVENOUS | Status: AC
  Filled 2016-12-13: qty 20

## 2016-12-13 MED ORDER — FENTANYL CITRATE (PF) 250 MCG/5ML IJ SOLN
INTRAMUSCULAR | Status: DC | PRN
  Administered 2016-12-13: 100 ug via INTRAVENOUS

## 2016-12-13 MED ORDER — OXYCODONE HCL 5 MG/5ML PO SOLN: 5.0000 mg | Freq: Once | ORAL | Status: DC | PRN

## 2016-12-13 MED ORDER — DEXAMETHASONE SODIUM PHOSPHATE 10 MG/ML IJ SOLN
INTRAMUSCULAR | Status: DC | PRN
  Administered 2016-12-13: 10 mg via INTRAVENOUS

## 2016-12-13 SURGICAL SUPPLY — 55 items
ADH SKN CLS APL DERMABOND .7 (GAUZE/BANDAGES/DRESSINGS) ×1
BIT DRILL 5.0 QC 6.5 (BIT) ×1 IMPLANT
BIT DRILL 5.0 QC 6.5MM (BIT) ×1
BLADE SURG 10 STRL SS (BLADE) ×4 IMPLANT
BNDG COHESIVE 6X5 TAN STRL LF (GAUZE/BANDAGES/DRESSINGS) ×2 IMPLANT
BRUSH SCRUB SURG 4.25 DISP (MISCELLANEOUS) ×6 IMPLANT
CHLORAPREP W/TINT 26ML (MISCELLANEOUS) ×6 IMPLANT
COVER SURGICAL LIGHT HANDLE (MISCELLANEOUS) ×6 IMPLANT
DERMABOND ADVANCED (GAUZE/BANDAGES/DRESSINGS) ×2
DERMABOND ADVANCED .7 DNX12 (GAUZE/BANDAGES/DRESSINGS) IMPLANT
DRAPE C-ARM 42X72 X-RAY (DRAPES) ×3 IMPLANT
DRAPE C-ARMOR (DRAPES) ×3 IMPLANT
DRAPE IMP U-DRAPE 54X76 (DRAPES) ×5 IMPLANT
DRAPE SURG 17X23 STRL (DRAPES) ×2 IMPLANT
DRAPE U-SHAPE 47X51 STRL (DRAPES) ×3 IMPLANT
DRSG ADAPTIC 3X8 NADH LF (GAUZE/BANDAGES/DRESSINGS) ×1 IMPLANT
DRSG MEPILEX BORDER 4X4 (GAUZE/BANDAGES/DRESSINGS) ×2 IMPLANT
DRSG PAD ABDOMINAL 8X10 ST (GAUZE/BANDAGES/DRESSINGS) ×4 IMPLANT
ELECT REM PT RETURN 9FT ADLT (ELECTROSURGICAL) ×3
ELECTRODE REM PT RTRN 9FT ADLT (ELECTROSURGICAL) ×1 IMPLANT
EVACUATOR 1/8 PVC DRAIN (DRAIN) IMPLANT
GLOVE BIO SURGEON STRL SZ7.5 (GLOVE) ×12 IMPLANT
GLOVE BIOGEL PI IND STRL 7.5 (GLOVE) ×1 IMPLANT
GLOVE BIOGEL PI INDICATOR 7.5 (GLOVE) ×4
GLOVE ECLIPSE 7.5 STRL STRAW (GLOVE) ×2 IMPLANT
GLOVE SURG SS PI 7.5 STRL IVOR (GLOVE) ×2 IMPLANT
GOWN STRL REUS W/ TWL LRG LVL3 (GOWN DISPOSABLE) ×2 IMPLANT
GOWN STRL REUS W/TWL LRG LVL3 (GOWN DISPOSABLE) ×6
KIT BASIN OR (CUSTOM PROCEDURE TRAY) ×3 IMPLANT
KIT ROOM TURNOVER OR (KITS) ×3 IMPLANT
MANIFOLD NEPTUNE II (INSTRUMENTS) ×1 IMPLANT
NS IRRIG 1000ML POUR BTL (IV SOLUTION) ×3 IMPLANT
PACK TOTAL JOINT (CUSTOM PROCEDURE TRAY) ×3 IMPLANT
PACK UNIVERSAL I (CUSTOM PROCEDURE TRAY) ×2 IMPLANT
PAD ARMBOARD 7.5X6 YLW CONV (MISCELLANEOUS) ×6 IMPLANT
PIN GUIDE 3.2X300MM (PIN) ×8 IMPLANT
SCREW CANN 7.0X100 (Screw) ×2 IMPLANT
SCREW CANN 7.0X85 32M (Screw) ×2 IMPLANT
SCREW THREADED 7.0X95 (Screw) ×2 IMPLANT
SPONGE LAP 18X18 X RAY DECT (DISPOSABLE) ×3 IMPLANT
STAPLER VISISTAT 35W (STAPLE) ×3 IMPLANT
SUT MNCRL AB 3-0 PS2 18 (SUTURE) ×3 IMPLANT
SUT MON AB 2-0 CT1 36 (SUTURE) ×3 IMPLANT
SUT PROLENE 0 CT (SUTURE) IMPLANT
SUT VIC AB 0 CT1 27 (SUTURE) ×3
SUT VIC AB 0 CT1 27XBRD ANBCTR (SUTURE) ×1 IMPLANT
SUT VIC AB 1 CT1 27 (SUTURE) ×12
SUT VIC AB 1 CT1 27XBRD ANTBC (SUTURE) ×4 IMPLANT
SUT VIC AB 2-0 CT1 27 (SUTURE) ×3
SUT VIC AB 2-0 CT1 TAPERPNT 27 (SUTURE) ×1 IMPLANT
TOWEL OR 17X24 6PK STRL BLUE (TOWEL DISPOSABLE) ×3 IMPLANT
TOWEL OR 17X26 10 PK STRL BLUE (TOWEL DISPOSABLE) ×6 IMPLANT
TRAY FOLEY W/METER SILVER 16FR (SET/KITS/TRAYS/PACK) ×2 IMPLANT
WASHER 12.7MM X 6.5MM (Washer) ×2 IMPLANT
WATER STERILE IRR 1000ML POUR (IV SOLUTION) ×3 IMPLANT

## 2016-12-13 NOTE — Progress Notes (Signed)
Events noted in the last 24 hours. Mr. 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 He presented with hip pain and found to have fracture at the right femoral neck. Surgery is pending in the immediate future.  I see no objection or contraindication from his chronic leukemia standpoint. I recommend continuing [name] throughout his hospitalization and in the perioperative period.  I do not think this fracture is pathological related to his leukemia. It will be extremely rare to have infiltration of CML into the bone especially in the setting of a long standing remission for the last 11 years.

## 2016-12-13 NOTE — Consult Note (Signed)
Orthopaedic Trauma Service (OTS) Consult   Patient ID: JW COVIN MRN: [id number] DOB/AGE: Oct 14, 1975 41 y.o.  Reason for Consult: Right femoral neck fracture Referring Physician: Dr. [name], MD with [facility]   HPI: Darrell Russo is an 41 y.o. male who is being seen in consultation at the request of Dr. Rolena Infante for evaluation of right femoral neck fracture. Darrell Russo has autism and lives at a group home. He has been complaining of hip pain for a few weeks. He went to the ER and was sent home and told he had arthritis. Yesterday he fell down and couldn't get up and was found to have a femoral neck fracture. I was asked to consult for the complexity of the fracture. He is an active individual and does not use an assist device. No other musculoskeletal complaints. Has a history of CML but according to mom everything has looked good.  Past Medical History:  Diagnosis Date  . Autism   . Folliculitis   . Hypertension   . Impulse control disorder   . Leukemia ([place])   . TMJ (dislocation of temporomandibular joint)     Past Surgical History:  Procedure Laterality Date  . ANKLE SURGERY     l/ankle fracture 5 years ago    History reviewed. No pertinent family history.  Social History:  reports that he has never smoked. He does not have any smokeless tobacco history on file. He reports that he does not drink alcohol or use drugs.  Allergies: No Known Allergies  Medications: I have reviewed the patient's current medications.  ROS: Constitutional: No fever or chills Vision: No changes in vision ENT: No difficulty swallowing CV: No chest pain Pulm: No SOB or wheezing GI: No nausea or vomiting GU: No urgency or inability to hold urine Skin: No poor wound healing Neurologic: No numbness or tingling Psychiatric: No depression or anxiety Heme: No bruising Allergic: No reaction to medications or food   Exam: Blood pressure 110/67, pulse (!) 103, temperature 98.4  F (36.9 C), temperature source Axillary, resp. rate 17, SpO2 97 %. General:No acute distress Orientation:Awake and alert Mood and Affect: Cooperative and pleasant Gait: Unable to assess Coordination and balance: Normal  RLE: Pain with logroll. Unable to move leg without pain. Knee and ankle without pain or swelling. Motor and sensory intact. No lymphadeonpathy, normal reflexes. Warm and well perfused foot.  LLE: Skin without lesions. No tenderness to palpation. Full painless ROM, full strength in each muscle groups without evidence of instability.   Medical Decision Making: Imaging: X-rays and CT scan reviewed which show displaced right femoral neck fracture, mild comminution at inferior femoral neck  Labs: Hgb 10.6 INR 1.13  Medical history and chart was reviewed  Assessment/Plan: 41 year old with autism and CML with right displaced femoral neck  -With patient's young age I recommend proceeding with ORIF -I will attempt a closed reduction but will do ORIF if unsuccessful. Risks and benefits discussed with mom. Risks discussed included bleeding requiring blood transfusion, bleeding causing a hematoma, infection, malunion, nonunion, AVN, damage to surrounding nerves and blood vessels, pain, hardware prominence or irritation, hardware failure, stiffness, post-traumatic arthritis, DVT/PE, compartment syndrome, and even death. -We will proceed with surgery this evening.  Shona Needles, MD [facility] [phone] (phone)

## 2016-12-13 NOTE — Progress Notes (Signed)
Contacted by Francia Greaves with [employer]. She follows pt's case outpatient and helps manage his medications/care. Wanted to make sure her and her team were updated of pt's disposition after surgery to help coordinate care once pt discharged. Stated she could be reached at [phone] or [phone] (her cell phone if after hours). Will pass information along to CM and CSW.   Pt currently resting comfortably in bed. Will continue to monitor

## 2016-12-13 NOTE — Anesthesia Preprocedure Evaluation (Signed)
Anesthesia Evaluation  Patient identified by MRN, date of birth, ID band Patient awake    Reviewed: Allergy & Precautions, NPO status , Patient's Chart, lab work & pertinent test results  Airway Mallampati: II  TM Distance: >3 FB Neck ROM: Full    Dental no notable dental hx.    Pulmonary neg pulmonary ROS,    Pulmonary exam normal breath sounds clear to auscultation       Cardiovascular hypertension, negative cardio ROS Normal cardiovascular exam Rhythm:Regular Rate:Normal     Neuro/Psych negative neurological ROS  negative psych ROS   GI/Hepatic negative GI ROS, Neg liver ROS,   Endo/Other  negative endocrine ROS  Renal/GU negative Renal ROS     Musculoskeletal negative musculoskeletal ROS (+)   Abdominal   Peds  Hematology negative hematology ROS (+)   Anesthesia Other Findings   Reproductive/Obstetrics                             Anesthesia Physical Anesthesia Plan  ASA: II  Anesthesia Plan: General   Post-op Pain Management:    Induction: Intravenous  PONV Risk Score and Plan: 3 and Ondansetron, Dexamethasone, Midazolam and Propofol infusion  Airway Management Planned: Oral ETT  Additional Equipment:   Intra-op Plan:   Post-operative Plan: Extubation in OR  Informed Consent: I have reviewed the patients History and Physical, chart, labs and discussed the procedure including the risks, benefits and alternatives for the proposed anesthesia with the patient or authorized representative who has indicated his/her understanding and acceptance.   Dental advisory given  Plan Discussed with: CRNA  Anesthesia Plan Comments:         Anesthesia Quick Evaluation

## 2016-12-13 NOTE — Transfer of Care (Signed)
Immediate Anesthesia Transfer of Care Note  Patient: Darrell Russo  Procedure(s) Performed: ORIF of Right Femoral neck fracture (Right Hip)  Patient Location: PACU  Anesthesia Type:General  Level of Consciousness: awake, drowsy and patient cooperative  Airway & Oxygen Therapy: Patient Spontanous Breathing and Patient connected to nasal cannula oxygen  Post-op Assessment: Report given to RN and Post -op Vital signs reviewed and stable  Post vital signs: Reviewed and stable  Last Vitals:  Vitals:   12/13/16 1500 12/13/16 2040  BP: 122/79 131/82  Pulse: (!) 108 99  Resp: 16 16  Temp: 37.7 C 37.3 C  SpO2: 100% 100%    Last Pain:  Vitals:   12/13/16 2040  TempSrc:   PainSc: Asleep         Complications: No apparent anesthesia complications

## 2016-12-13 NOTE — Anesthesia Procedure Notes (Signed)
Performed by: Jakoby Melendrez B       

## 2016-12-13 NOTE — Op Note (Signed)
OrthopaedicSurgeryOperativeNote (NTI:[id number]) Date of Surgery: 12/13/2016  Admit Date: 12/12/2016   Diagnoses: Pre-Op Diagnoses: Right displaced femoral neck fracture  Post-Op Diagnosis: Same  Procedures: CPT 27235-Closed reduction and percutaneous fixation of femoral neck fracture  Surgeons: Primary: Shona Needles, MD   Location:[facility] OR ROOM 06   AnesthesiaGeneral   Antibiotics:Ancef 2g preop   Tourniquettime:None used  [id number]:09XI  Complications:None  Specimens: None  Implants:  Implant Name Type Inv. Item Serial No. Manufacturer Lot No. LRB No. Used Action  SCREW CANN 7.0X100 - [id number] Screw SCREW CANN 7.0X100  [employer]  Right 1 Implanted    IndicationsforSurgery: Darrell Russo is a 41 year old male that has autism and lives at a group home. He has been complaining of hip pain for a few weeks. He went to the ER and was sent home and told he had arthritis. Yesterday he fell down and couldn't get up and was found to have a femoral neck fracture. I was asked to consult for the complexity of the fracture. Due to the low energy of the fracture I felt that an attempt at a closed reduction would be reasonable with conversion to open reduction if unsuccessful.  Risks discussed included bleeding requiring blood transfusion, bleeding causing a hematoma, infection, malunion, nonunion, AVN, damage to surrounding nerves and blood vessels, pain, hardware prominence or irritation, hardware failure, stiffness, post-traumatic arthritis, DVT/PE, compartment syndrome, and even death. Risks and benefits were extensively discussed as noted above and the patient and their family agreed to proceed with surgery and consent was obtained.  Operative Findings: Displaced femoral neck fracture reduced with closed means and fixed with three percutaneous 7.57mm Smith and Nephew cannulated screws.  Procedure: The patient was identified in the preoperative  holding area. Consent was confirmed with the patient and their family and all questions were answered. The operative extremity was marked after confirmation with the patient they were then brought back to the operating room by our anesthesia colleagues. They were placed under general anesthesia and carefully transferred to a radiolucent flat top table. A bump was placed under the operative hip and fluoroscopy was used. The operative extremity was then prepped and draped in usual sterile fashion. A preoperative timeout was performed to verify the patient, the procedure, and the extremity. Preoperative antibiotics were dosed.  I first started with an attempt at a closed reduction. The fracture had an apex anterior angulation to it and using traction and internal rotation I was able to reduce it nearly anatomic closed. Using fluoroscopy as a guide I marked out an incision. I carried this down through skin and the IT band. I used 2.40mm guidepins to direct up the femoral neck in an inverted triangle. I placed anterior superior guidepin, a posterior superior guidepin and a posterior inferior guidepin. I left the guidepins short of the fracture. I confirmed positioning with fluoroscopy and then performed the reduction maneuver and passed the guidepins across the fracture.   The anterior superior guidepin was measured first and a partially threaded 7.0 screw was placed to compress along the anterior cortex. The remainder of screws were fully threaded and a washer was placed on the most inferior one. Excellent purchase was obtained on all of the screws.  Final fluoroscopic images were obtained confirming length on all screws and reduction. An approach withdrawal technique was used to make sure all screws were extra-articular. The incision was irrigated and closed with 2-0 vicryl, 3-0 monocryl and dermabond. A dressing was placed. The patient was  awoken from anesthesia and taken to the PACU in stable condition.    Post Op Plan/Instructions: The patient will be touchdown weightbearing to the right leg. He will receive postoperative Ancef. I will recommend Lovenox for VTE prophylaxis but will defer to the primary team.  I was present and performed the entire surgery.  Katha Hamming, MD [facility]

## 2016-12-13 NOTE — Progress Notes (Addendum)
PROGRESS NOTE   Darrell Russo  KVQ:[id number]    DOB: 04-Jul-1975    DOA: 12/12/2016  PCP: Aletha Halim., PA-C   I have briefly reviewed patients previous medical records in [facility].  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   Assessment & Plan:   Principal Problem:   Hip fracture ([place]) Active Problems:   Leukemia ([place])   Essential hypertension   Autism   Right hip fracture - No report of fall or injury. Hence etiology of fracture not clear and per his primary oncologist, does not appear to be pathological fracture. - CT showed acute fracture through the base of the right femoral neck. - Orthopedics was consulted and plan surgical fixation 10/17. - May need to consider further workup as outpatient including bone density study.  CML - In remission on Gleevec since 2007. Per oncologist input, continue the [place] throughout his hospitalization and in the perioperative period.  Autism/Impulse control disorder Continue home medications. Stable.  Essential hypertension Controlled. Continue metoprolol and furosemide.  Hypothyroid Continue Synthroid.  Anemia May have had some acute blood loss related to fracture. Follow CBC in a.m. Transfuse if hemoglobin is 7 or less.  Mild transaminitis - Minimal elevation of AST.? Rhabdomyolysis. Check CK. No GI symptoms reported.   DVT prophylaxis: SCD's  Code Status: Full Family Communication: Discussed in detail with patient's mother at bedside. Disposition: Likely will need to DC to SNF when stable.   Consultants:  Orthopedics   Procedures:  None  Antimicrobials:  None    Subjective: Seen this morning prior to surgery. Reported  appropriate pain in right hip, worse with movements. As per mother at bedside, patient is a resident of rehabilitation and was ambulating independently. She denied cardiac, respiratory illnesses or stroke in the past.  ROS: As above.  Objective:  Vitals:   12/12/16 2036 12/13/16 0417 12/13/16 0900 12/13/16 1500  BP: 136/88 116/66 110/67 122/79  Pulse: 84 (!) 108 (!) 103 (!) 108  Resp: 20 17  16   Temp: 98.8 F (37.1 C) 98.4 F (36.9 C)  99.8 F (37.7 C)  TempSrc: Oral Axillary Axillary Oral  SpO2: 100% 97%  100%    Examination:  General exam: Young male, moderately built and obese, lying comfortably supine in bed. Respiratory system: Clear to auscultation. Respiratory effort normal. Cardiovascular system: S1 & S2 heard, RRR. No JVD, murmurs, rubs, gallops or clicks. No pedal edema.Telemetry: Sinus tachycardia in the 100s-110s. Gastrointestinal system: Abdomen is nondistended, soft and nontender. No organomegaly or masses felt. Normal bowel sounds heard. Central nervous system: Alert and oriented. No focal neurological deficits. Extremities: Symmetric 5 x 5 power. Right lower extremity shortened and externally rotated, movements restricted due to pain. Skin: No rashes, lesions or ulcers Psychiatry: Judgement and insight impaired. Mood & affect flat.     Data Reviewed: I have personally reviewed following labs and imaging studies  CBC:  Recent Labs Lab 12/12/16 1423 12/13/16 0444  WBC 8.5 7.2  NEUTROABS 6.1  --   HGB 11.4* 10.6*  HCT 33.6* 31.4*  MCV 99.4 98.7  PLT 190 643   Basic Metabolic Panel:  Recent Labs Lab 12/12/16 1423 12/13/16 0444  NA 140 138  K 4.2  3.9  CL 105 104  CO2 27 25  GLUCOSE 100* 88  BUN 27* 24*  CREATININE 1.09 1.17  CALCIUM 10.1 9.1   Liver Function Tests:  Recent Labs Lab 12/12/16 1423 12/13/16 0444  AST 136* 109*  ALT 43 35  ALKPHOS 157* 120  BILITOT 0.5 0.4  PROT 7.7 6.1*  ALBUMIN 4.3 3.4*   Coagulation  Profile:  Recent Labs Lab 12/12/16 1423 12/13/16 0444  INR 1.10 1.13     Recent Results (from the past 240 hour(s))  Surgical PCR screen     Status: None   Collection Time: 12/13/16  9:51 AM  Result Value Ref Range Status   MRSA, PCR NEGATIVE NEGATIVE Final   Staphylococcus aureus NEGATIVE NEGATIVE Final    Comment: (NOTE) The Xpert SA Assay (FDA approved for NASAL specimens in patients 78 years of age and older), is one component of a comprehensive surveillance program. It is not intended to diagnose infection nor to guide or monitor treatment.          Radiology Studies: Ct Hip Right Wo Contrast  Result Date: 12/12/2016 CLINICAL DATA:  Right hip pain and tenderness to touch. The patient is being treated for leukemia. Unknown history of trauma. Initial encounter. EXAM: CT OF THE RIGHT HIP WITHOUT CONTRAST TECHNIQUE: Multidetector CT imaging of the right hip was performed according to the standard protocol. Multiplanar CT image reconstructions were also generated. COMPARISON:  Plain films right hip this same day. FINDINGS: Bones/Joint/Cartilage The patient has a mildly comminuted fracture through the base of the right femoral neck. The anterior fracture margins are distracted 1.4 cm. Slight posterior displacement of the intertrochanteric femur is identified. The femoral head is located. No other fracture is seen. No focal bone lesion to suggest pathologic injury is identified. Ligaments Suboptimally assessed by CT. Muscles and Tendons Appear intact. Soft tissues Negative. IMPRESSION: Acute fracture through the base of the right femoral neck without imaging features to suggest pathologic injury. The exam is otherwise negative. Electronically Signed   By: Inge Rise M.D.   On: 12/12/2016 15:47   Dg Chest Port 1 View  Result Date: 12/12/2016 CLINICAL DATA:  Leukemia.  Preoperative evaluation for hip fracture. EXAM: PORTABLE CHEST 1 VIEW COMPARISON:  April 12, 2009 FINDINGS:  Lungs are clear. Heart size and pulmonary vascularity are normal. No adenopathy. No bone lesions. IMPRESSION: No evident adenopathy.  No edema or consolidation. Electronically Signed   By: Lowella Grip III M.D.   On: 12/12/2016 18:24   Dg Hip Unilat  With Pelvis 2-3 Views Right  Result Date: 12/12/2016 CLINICAL DATA:  Pain with uncertain history of trauma. History of leukemia EXAM: DG HIP (WITH OR WITHOUT PELVIS) 2-3V RIGHT COMPARISON:  None. FINDINGS: Frontal pelvis as well as frontal and lateral right hip images were obtained. There is a basicervical region fracture of the right femur with varus angulation of the fracture site. A small fracture fragment is displaced medially. No other fracture is evident. No dislocation. There is slight symmetric narrowing of both hip joints. IMPRESSION: Comminuted basicervical fracture of the proximal right femur with varus angulation at the fracture site. No other fracture. No dislocation. There is mild symmetric narrowing of both hip joints. Electronically Signed   By: Lowella Grip III M.D.   On: 12/12/2016 13:37        Scheduled Meds: . [MAR Hold] divalproex  2,000 mg Oral Daily  . [MAR Hold] furosemide  20 mg Oral Daily  . [MAR Hold] haloperidol  1.5 mg Oral Daily  . [MAR Hold] imatinib  100 mg Oral BID WC  . [MAR Hold] Influenza vac split quadrivalent PF  0.5 mL Intramuscular Tomorrow-1000  . [MAR Hold] levothyroxine  225 mcg Oral QAC breakfast  . [MAR Hold] metoprolol succinate  50 mg Oral Daily  . [MAR Hold] minocycline  100 mg Oral BID  . [MAR Hold] QUEtiapine  800 mg Oral QHS  . [MAR Hold] zonisamide  300 mg Oral Daily   Continuous Infusions: . [MAR Hold]  ceFAZolin (ANCEF) IV       LOS: 1 day     Carmine Youngberg, MD, FACP, FHM. [facility] Pager [phone]  If 7PM-7AM, please contact night-coverage [email or web address] Password [email or web address] 12/13/2016, 5:00 PM

## 2016-12-13 NOTE — Anesthesia Procedure Notes (Signed)
Procedure Name: Intubation Date/Time: 12/13/2016 6:41 PM Performed by: Audry Pili Pre-anesthesia Checklist: Patient identified, Patient being monitored, Suction available and Emergency Drugs available Patient Re-evaluated:Patient Re-evaluated prior to induction Oxygen Delivery Method: Circle system utilized Preoxygenation: Pre-oxygenation with 100% oxygen Induction Type: IV induction Ventilation: Mask ventilation without difficulty Laryngoscope Size: Miller and 1 Grade View: Grade I Tube type: Oral Tube size: 7.5 mm Number of attempts: 2 (First attempt by CRNA with Mac 3, grade 2B view) Airway Equipment and Method: Stylet and Oral airway Placement Confirmation: ETT inserted through vocal cords under direct vision,  positive ETCO2 and breath sounds checked- equal and bilateral Secured at: 23 cm Tube secured with: Tape Dental Injury: Teeth and Oropharynx as per pre-operative assessment

## 2016-12-13 NOTE — Anesthesia Postprocedure Evaluation (Signed)
Anesthesia Post Note  Patient: Darrell Russo  Procedure(s) Performed: ORIF of Right Femoral neck fracture (Right Hip)     Patient location during evaluation: PACU Anesthesia Type: General Level of consciousness: awake and alert Pain management: pain level controlled Vital Signs Assessment: post-procedure vital signs reviewed and stable Respiratory status: spontaneous breathing, nonlabored ventilation and respiratory function stable Cardiovascular status: blood pressure returned to baseline and stable Postop Assessment: no apparent nausea or vomiting Anesthetic complications: no    Last Vitals:  Vitals:   12/13/16 2100 12/13/16 2115  BP: (!) 141/85 (!) 144/89  Pulse: 95 89  Resp: 18 17  Temp:    SpO2: 97% 96%    Last Pain:  Vitals:   12/13/16 2130  TempSrc:   PainSc: [name]

## 2016-12-14 ENCOUNTER — Encounter (HOSPITAL_COMMUNITY): Payer: Self-pay

## 2016-12-14 DIAGNOSIS — D62 Acute posthemorrhagic anemia: Secondary | ICD-10-CM

## 2016-12-14 LAB — CBC
HCT: 32.5 % — ABNORMAL LOW (ref 39.0–52.0)
Hemoglobin: 10.8 g/dL — ABNORMAL LOW (ref 13.0–17.0)
MCH: 33.2 pg (ref 26.0–34.0)
MCHC: 33.2 g/dL (ref 30.0–36.0)
MCV: 100 fL (ref 78.0–100.0)
PLATELETS: 180 10*3/uL (ref 150–400)
RBC: 3.25 MIL/uL — AB (ref 4.22–5.81)
RDW: 14.4 % (ref 11.5–15.5)
WBC: 7.7 10*3/uL (ref 4.0–10.5)

## 2016-12-14 LAB — VITAMIN D 25 HYDROXY (VIT D DEFICIENCY, FRACTURES): VIT D 25 HYDROXY: 30 ng/mL (ref 30.0–100.0)

## 2016-12-14 MED ORDER — ENOXAPARIN SODIUM 60 MG/0.6ML ~~LOC~~ SOLN
55.0000 mg | SUBCUTANEOUS | Status: DC
  Administered 2016-12-14 – 2016-12-17 (×4): 55 mg via SUBCUTANEOUS
  Filled 2016-12-14 (×4): qty 0.6

## 2016-12-14 NOTE — Care Management (Signed)
Patient lives at [facility]. Case manager spoke with Francia Greaves for [employer]. Kennyth Lose says that the group home is not staffed or equipped to manager patient postoperatively. Patient will need shortterm rehab at [facility]. Kennyth Lose will follow patient when discharged. Patient is receiving  Gleezic for his leukemia, medication is very expensive and patient can not miss a dose. Case manager will continue to monitor.

## 2016-12-14 NOTE — Progress Notes (Signed)
Orthopaedic Trauma Progress Note  S: Doing well, still afraid of moving hip  O: RLE: dressing clean, dry and intact. Pain with attempted ROM of hip. Motor and sensory intact distall  A/P: 41 year old autistic male s/p CRPP right femoral neck fracture  -TDWB RLE -PT/OT -Pain control -Recommend lovenox for VTE but will defer to primary team.  Shona Needles, MD [facility] [phone] (phone)

## 2016-12-14 NOTE — Progress Notes (Signed)
PROGRESS NOTE   Darrell Russo  EUM:[id number]    DOB: 1975/09/13    DOA: 12/12/2016  PCP: Aletha Halim., PA-C   I have briefly reviewed patients previous medical records in [facility].  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   Assessment & Plan:   Principal Problem:   Hip fracture ([place]) Active Problems:   Leukemia ([place])   Essential hypertension   Autism   Right hip fracture - No report of fall or injury. Hence etiology of fracture not clear and per his primary oncologist, does not appear to be pathological fracture. - CT showed acute fracture through the base of the right femoral neck. - Orthopedics was consulted and he underwent closed reduction and percutaneous fixation of femoral neck fracture on 12/13/16. - As per orthopedics, touchdown weightbearing on right leg, Lovenox for DVT prophylaxis. - May need to consider further workup as outpatient including bone density study. - Group home will not be able to manage his postop care and hence short-term SNF at discharge. Discussed with case management and Education officer, museum.  CML - In remission on Gleevec since 2007. Per oncologist input, continue the [place] throughout his hospitalization and in the perioperative period.  Autism/Impulse control disorder Continue home medications. Stable.  Essential hypertension Controlled. Continue metoprolol and furosemide.  Hypothyroid Continue Synthroid.  Anemia May have had some acute blood loss related to fracture. Stable. Follow CBC in a.m. Transfuse if hemoglobin is 7 or less.  Mild transaminitis - Minimal elevation of AST.? Rhabdomyolysis. Check  CK and CMP in a.m. No GI symptoms reported.   DVT prophylaxis: SCD's  Code Status: Full Family Communication: Discussed in detail with patient's mother at bedside. Disposition: DC to SNF in the next 1-2 days.   Consultants:  Orthopedics   Procedures:  Closed reduction and percutaneous fixation of femoral neck fracture 12/13/16.  Antimicrobials:  None    Subjective: Appropriate postop right hip pain. Denies any other complaints. As per mother at bedside, patient has done well. Tolerated diet without difficulty. No BM for the last 3-4 days.  ROS: As above.  Objective:  Vitals:   12/13/16 2100 12/13/16 2115 12/14/16 0500 12/14/16 1500  BP: (!) 141/85 (!) 144/89 113/60 126/69  Pulse: 95 89 (!) 103 (!) 112  Resp: 18 17 17 18   Temp:   98.6 F (37 C) (!) 100.6 F (38.1 C)  TempSrc:   Oral Oral  SpO2: 97% 96% 97% 100%  Weight:      Height:        Examination:  General exam: Young male, moderately built and obese, lying comfortably supine in bed.Stable. Respiratory system: Clear to auscultation. Respiratory effort normal. Stable. Cardiovascular system: S1 & S2 heard, RRR. No JVD, murmurs, rubs, gallops or clicks. No pedal edema.stable. Gastrointestinal system: Abdomen is nondistended, soft and nontender. No organomegaly or masses felt. Normal bowel sounds heard. Stable. Central nervous system: Alert and oriented. No focal neurological deficits. Extremities: Symmetric 5 x 5 power. Right hip postop dressing site clean and dry. Skin: No rashes, lesions or ulcers Psychiatry: Judgement and insight impaired. Mood & affect flat.     Data Reviewed: I have personally reviewed following  labs and imaging studies  CBC:  Recent Labs Lab 12/12/16 1423 12/13/16 0444 12/14/16 0332  WBC 8.5 7.2 7.7  NEUTROABS 6.1  --   --   HGB 11.4* 10.6* 10.8*  HCT 33.6* 31.4* 32.5*  MCV 99.4 98.7 100.0  PLT 190 170 616   Basic Metabolic Panel:  Recent Labs Lab 12/12/16 1423  12/13/16 0444  NA 140 138  K 4.2 3.9  CL 105 104  CO2 27 25  GLUCOSE 100* 88  BUN 27* 24*  CREATININE 1.09 1.17  CALCIUM 10.1 9.1   Liver Function Tests:  Recent Labs Lab 12/12/16 1423 12/13/16 0444  AST 136* 109*  ALT 43 35  ALKPHOS 157* 120  BILITOT 0.5 0.4  PROT 7.7 6.1*  ALBUMIN 4.3 3.4*   Coagulation Profile:  Recent Labs Lab 12/12/16 1423 12/13/16 0444  INR 1.10 1.13     Recent Results (from the past 240 hour(s))  Surgical PCR screen     Status: None   Collection Time: 12/13/16  9:51 AM  Result Value Ref Range Status   MRSA, PCR NEGATIVE NEGATIVE Final   Staphylococcus aureus NEGATIVE NEGATIVE Final    Comment: (NOTE) The Xpert SA Assay (FDA approved for NASAL specimens in patients 26 years of age and older), is one component of a comprehensive surveillance program. It is not intended to diagnose infection nor to guide or monitor treatment.          Radiology Studies: Dg Chest Port 1 View  Result Date: 12/12/2016 CLINICAL DATA:  Leukemia.  Preoperative evaluation for hip fracture. EXAM: PORTABLE CHEST 1 VIEW COMPARISON:  April 12, 2009 FINDINGS: Lungs are clear. Heart size and pulmonary vascularity are normal. No adenopathy. No bone lesions. IMPRESSION: No evident adenopathy.  No edema or consolidation. Electronically Signed   By: Lowella Grip III M.D.   On: 12/12/2016 18:24   Dg C-arm 61-120 Min  Result Date: 12/13/2016 CLINICAL DATA:  Closed reduction followed by cannulated hip pinning of right hip fracture. EXAM: DG C-ARM 61-120 MIN; OPERATIVE RIGHT HIP WITH PELVIS COMPARISON:  None. FINDINGS: A total of 5 minutes 26 seconds of fluoroscopic time was utilized during placement of 3 cannulated screws across the known basicervical comminuted fracture of the right femur. No joint dislocation. Fine bony detail is limited by the C-arm fluoroscopic technique. Alignment has improved with less varus angulation noted. IMPRESSION: Near-anatomic  alignment of right basicervical comminuted fracture traversed by 3 cannulated screws as seen fluoroscopically. Electronically Signed   By: Ashley Royalty M.D.   On: 12/13/2016 22:19   Dg Hip Operative Unilat W Or W/o Pelvis Right  Result Date: 12/13/2016 CLINICAL DATA:  Closed reduction followed by cannulated hip pinning of right hip fracture. EXAM: DG C-ARM 61-120 MIN; OPERATIVE RIGHT HIP WITH PELVIS COMPARISON:  None. FINDINGS: A total of 5 minutes 26 seconds of fluoroscopic time was utilized during placement of 3 cannulated screws across the known basicervical comminuted fracture of the right femur. No joint dislocation. Fine bony detail is limited by the C-arm fluoroscopic technique. Alignment has improved with less varus angulation noted. IMPRESSION: Near-anatomic alignment of right basicervical comminuted fracture traversed by 3 cannulated screws as seen fluoroscopically. Electronically Signed   By: Ashley Royalty M.D.   On: 12/13/2016 22:19   Dg Hip Unilat With Pelvis 2-3 Views Right  Result Date: 12/13/2016 CLINICAL DATA:  Right femoral neck fixation post fracture. EXAM: DG HIP (WITH OR WITHOUT PELVIS) 2-3V RIGHT COMPARISON:  Preoperative radiographs from 12/12/2016. FINDINGS:  A comminuted basicervical fracture of the right femur traversed by 3 fixation screws is now noted. Alignment is near anatomic. Joint space narrowing of both hips. Intact pelvis. IMPRESSION: Interval fixation of right femoral neck fracture with 3 screws. Near-anatomic alignment noted. Electronically Signed   By: Ashley Royalty M.D.   On: 12/13/2016 22:23        Scheduled Meds: . divalproex  2,000 mg Oral Daily  . furosemide  20 mg Oral Daily  . haloperidol  1.5 mg Oral Daily  . imatinib  100 mg Oral BID WC  . Influenza vac split quadrivalent PF  0.5 mL Intramuscular Tomorrow-1000  . levothyroxine  225 mcg Oral QAC breakfast  . metoprolol succinate  50 mg Oral Daily  . minocycline  100 mg Oral BID  . QUEtiapine  800 mg  Oral QHS  . zonisamide  300 mg Oral Daily   Continuous Infusions: .  ceFAZolin (ANCEF) IV 2 g (12/14/16 1545)  . lactated ringers 10 mL/hr at 12/13/16 1709     LOS: 2 days     Yisel Megill, MD, FACP, FHM. [facility] Pager [phone]  If 7PM-7AM, please contact night-coverage [email or web address] Password [email or web address] 12/14/2016, 4:18 PM

## 2016-12-14 NOTE — Progress Notes (Signed)
Mr. Borquez is doing well this am. His operation went well without issues. His pain is under control. CBC reviewed and appears to be stable and close to his baseline.  I recommend continuing [place] daily. His mother has his medication from home.  I see no issues with continuing it post op as he rehabs.  Please call with questions.

## 2016-12-14 NOTE — Evaluation (Signed)
Physical Therapy Evaluation Patient Details Name: Darrell Russo MRN: [id number] DOB: [birth year] Today's Date: 12/14/2016   History of Present Illness  41 y.o. male with medical history significant of autism, HTN, leukemia presenting with a R hip fracture s/p closed reduction with percutaneous screws  Clinical Impression  Pt pleasant with flat affect and decreased comprehension of cues, commands and restrictions. Pt required mod +2 assist for all segments of transfer with increased time. Pt with decreased strength, cognition, transfers, functional mobility and activity tolerance with lack of awareness for weight bearing restriction despite education. Pt will benefit from acute therapy to maximize mobility, function and transfers to decrease burden of care. Pt currrently unable to follow restrictions for standing and will need to master lateral and A/P transfers at present for functional mobility. Mom present and educated for transfers and plan throughout. Mom requesting return to group home for familiar environment and availability of her assist 24hr along with group home staff.     Follow Up Recommendations Home health PT;Supervision/Assistance - 24 hour (if group home agreeable, otherwise ST-SNF)    Equipment Recommendations  Hospital bed;Other (comment);Wheelchair cushion (measurements PT);Wheelchair (measurements PT) (drop arm BSC, hoyer lift)    Recommendations for Other Services       Precautions / Restrictions Precautions Precautions: Fall Restrictions RLE Weight Bearing: Touchdown weight bearing      Mobility  Bed Mobility Overal bed mobility: Needs Assistance Bed Mobility: Supine to Sit     Supine to sit: Mod assist;+2 for safety/equipment;HOB elevated     General bed mobility comments: cues for sequence with assist to move RLE and elevate trunk, increased time with pt attempting to use his preferred sequence rather than follow commands  Transfers Overall transfer  level: Needs assistance   Transfers: Lateral/Scoot Transfers          Lateral/Scoot Transfers: Mod assist;+2 safety/equipment;+2 physical assistance General transfer comment: pt unable to stand from EOB unclear if due to decreased command following or pain. Pt attempting to slide into chair even with cues to stand. Switched recliner for drop arm and performed lateral transfers to left with PT holding pt's right foot throughout. Cues for sequence and increased time with 2nd therapist for safety and control of pivot.   Ambulation/Gait             General Gait Details: unable  Stairs            Wheelchair Mobility    Modified Rankin (Stroke Patients Only)       Balance Overall balance assessment: Needs assistance   Sitting balance-Leahy Scale: Good                                       Pertinent Vitals/Pain Pain Assessment: 0-10 Pain Location: pt able to state pain, unable to rate, screaming with any movement of RLE or attempt at moving toward that side. screams with and without touch/movement of RLE Pain Intervention(s): Premedicated before session;Monitored during session;Repositioned    Home Living Family/patient expects to be discharged to:: Group home Living Arrangements: Group Home Available Help at Discharge: Family;Available 24 hours/day Type of Home: House Home Access: Ramped entrance     Home Layout: One level Home Equipment: Walker - 2 wheels      Prior Function Level of Independence: Independent         Comments: supervision with cooking     Hand Dominance  Extremity/Trunk Assessment   Upper Extremity Assessment Upper Extremity Assessment: Generalized weakness    Lower Extremity Assessment Lower Extremity Assessment: Generalized weakness    Cervical / Trunk Assessment Cervical / Trunk Assessment:  (forward head)  Communication   Communication: Other (comment) (pt with one word answers, does not  consistently reply to questions or commands)  Cognition Arousal/Alertness: Awake/alert Behavior During Therapy: Flat affect Overall Cognitive Status: History of cognitive impairments - at baseline                                 General Comments: pt unable to follow commands for TDWB. pt attempting to scoot to chair when cued to stand      General Comments      Exercises     Assessment/Plan    PT Assessment Patient needs continued PT services  PT Problem List Decreased strength;Decreased mobility;Decreased safety awareness;Decreased activity tolerance;Decreased cognition;Decreased knowledge of use of DME;Pain;Decreased knowledge of precautions       PT Treatment Interventions Functional mobility training;Gait training;Therapeutic exercise;Patient/family education;DME instruction;Therapeutic activities    PT Goals (Current goals can be found in the Care Plan section)  Acute Rehab PT Goals Patient Stated Goal: return to group home PT Goal Formulation: With family Time For Goal Achievement: 12/28/16 Potential to Achieve Goals: Fair    Frequency Min 4X/week   Barriers to discharge   pt lives in group home with 24hr assistance     Co-evaluation               AM-PAC PT "6 Clicks" Daily Activity  Outcome Measure Difficulty turning over in bed (including adjusting bedclothes, sheets and blankets)?: Unable Difficulty moving from lying on back to sitting on the side of the bed? : Unable Difficulty sitting down on and standing up from a chair with arms (e.g., wheelchair, bedside commode, etc,.)?: Unable Help needed moving to and from a bed to chair (including a wheelchair)?: A Lot Help needed walking in hospital room?: Total Help needed climbing 3-5 steps with a railing? : Total 6 Click Score: 7    End of Session Equipment Utilized During Treatment: Gait belt Activity Tolerance: Patient tolerated treatment well Patient left: in chair;with chair alarm  set;with call bell/phone within reach;with family/visitor present Nurse Communication: Mobility status;Precautions;Weight bearing status;Need for lift equipment;Other (comment) (use of sliding lateral transfer with RLE supported or lift recommended) PT Visit Diagnosis: Difficulty in walking, not elsewhere classified (R26.2);Muscle weakness (generalized) (M62.81);Pain;Other abnormalities of gait and mobility (R26.89) Pain - Right/Left: Right Pain - part of body: Leg    Time: 6269-4854 PT Time Calculation (min) (ACUTE ONLY): 43 min   Charges:   PT Evaluation $PT Eval Moderate Complexity: 1 Mod PT Treatments $Therapeutic Activity: 23-37 mins   PT G Codes:        Elwyn Reach, PT [phone]   [place] 12/14/2016, 12:41 PM

## 2016-12-15 DIAGNOSIS — F840 Autistic disorder: Secondary | ICD-10-CM

## 2016-12-15 DIAGNOSIS — R000 Tachycardia, unspecified: Secondary | ICD-10-CM

## 2016-12-15 LAB — CBC
HCT: 28.7 % — ABNORMAL LOW (ref 39.0–52.0)
HEMOGLOBIN: 9.4 g/dL — AB (ref 13.0–17.0)
MCH: 33.1 pg (ref 26.0–34.0)
MCHC: 32.8 g/dL (ref 30.0–36.0)
MCV: 101.1 fL — ABNORMAL HIGH (ref 78.0–100.0)
PLATELETS: 187 10*3/uL (ref 150–400)
RBC: 2.84 MIL/uL — AB (ref 4.22–5.81)
RDW: 14.9 % (ref 11.5–15.5)
WBC: 9.9 10*3/uL (ref 4.0–10.5)

## 2016-12-15 LAB — COMPREHENSIVE METABOLIC PANEL
ALBUMIN: 3.2 g/dL — AB (ref 3.5–5.0)
ALT: 36 U/L (ref 17–63)
AST: 55 U/L — AB (ref 15–41)
Alkaline Phosphatase: 123 U/L (ref 38–126)
Anion gap: 7 (ref 5–15)
BUN: 17 mg/dL (ref 6–20)
CHLORIDE: 105 mmol/L (ref 101–111)
CO2: 25 mmol/L (ref 22–32)
CREATININE: 1.06 mg/dL (ref 0.61–1.24)
Calcium: 8.6 mg/dL — ABNORMAL LOW (ref 8.9–10.3)
GFR calc Af Amer: >60 mL/min (ref >60)
GFR calc non Af Amer: >60 mL/min (ref >60)
GLUCOSE: 170 mg/dL — AB (ref 65–99)
Potassium: 3.6 mmol/L (ref 3.5–5.1)
SODIUM: 137 mmol/L (ref 135–145)
Total Bilirubin: 0.4 mg/dL (ref 0.3–1.2)
Total Protein: 6.2 g/dL — ABNORMAL LOW (ref 6.5–8.1)

## 2016-12-15 LAB — CK: Total CK: 1186 U/L — ABNORMAL HIGH (ref 49–397)

## 2016-12-15 MED ORDER — OXYCODONE HCL 5 MG PO TABS
5.0000 mg | ORAL_TABLET | Freq: Four times a day (QID) | ORAL | Status: DC | PRN
  Administered 2016-12-15 – 2016-12-18 (×8): 10 mg via ORAL
  Filled 2016-12-15 (×9): qty 2

## 2016-12-15 MED ORDER — POLYETHYLENE GLYCOL 3350 17 G PO PACK
17.0000 g | PACK | Freq: Two times a day (BID) | ORAL | Status: DC
  Administered 2016-12-15 – 2016-12-18 (×6): 17 g via ORAL
  Filled 2016-12-15 (×7): qty 1

## 2016-12-15 MED ORDER — SENNA 8.6 MG PO TABS
2.0000 | ORAL_TABLET | Freq: Every day | ORAL | Status: DC
  Administered 2016-12-15 – 2016-12-18 (×4): 17.2 mg via ORAL
  Filled 2016-12-15 (×5): qty 2

## 2016-12-15 NOTE — Progress Notes (Signed)
Orthopaedic Trauma Progress Note  S: Got up with therapy today  O: RLE: dressing clean, dry and intact. Taken down. Incision is clean dry and intact Pain with attempted ROM of hip. Motor and sensory intact distall  A/P: 42 year old autistic male s/p CRPP right femoral neck fracture  -TDWB RLE -PT/OT -Pain control -Recommend lovenox for VTE but will defer to primary team. -May follow up with me in clinic between 3-4 weeks.  Shona Needles, MD [facility] [phone] (phone)

## 2016-12-15 NOTE — Evaluation (Signed)
Occupational Therapy Evaluation Patient Details Name: Darrell Russo MRN: [id number] DOB: 08-Sep-1975 Today's Date: 12/15/2016    History of Present Illness 41 y.o. male with medical history significant of autism, HTN, leukemia presenting with a R hip fracture s/p closed reduction with percutaneous screws   Clinical Impression   This 41 y/o M presents with the above. At baseline Pt is independent with ADLs and functional mobility, was living in a group home. Pt presents with increased pain in RLE and decreased ability to maintain TDWB in RLE due to baseline cognitive deficits. Pt required ModA+2 for lateral transfer EOB to recliner this session, requires Max-total assist for LB ADLs secondary to pain and RLE functional deficits. Pt will benefit from continued acute OT services and recommend additional OT services in ST SNF setting after discharge to maximize Pt's safety and independence with ADLs and functional mobility.     Follow Up Recommendations  SNF;Supervision/Assistance - 24 hour    Equipment Recommendations  Other (comment) (defer to next venue )           Precautions / Restrictions Precautions Precautions: Fall Restrictions Weight Bearing Restrictions: Yes RLE Weight Bearing: Touchdown weight bearing      Mobility Bed Mobility Overal bed mobility: Needs Assistance Bed Mobility: Supine to Sit     Supine to sit: +2 for safety/equipment;HOB elevated     General bed mobility comments: assist to bring trunk into upright position and to advance LEs over EOB with use of chuck pad  Transfers Overall transfer level: Needs assistance   Transfers: Lateral/Scoot Transfers          Lateral/Scoot Transfers: Mod assist;+2 safety/equipment;+2 physical assistance General transfer comment: Pt completed lateral transfer as Pt unable to maintain TDWB for sit<>stand; transferred EOB to drop arm recliner with ModA +2 using chuck pad to the left, requires multimodal cues to  initiate transfer and for hand placement during transfer completion     Balance Overall balance assessment: Needs assistance Sitting-balance support: Feet supported Sitting balance-Leahy Scale: Good                                     ADL either performed or assessed with clinical judgement   ADL Overall ADL's : Needs assistance/impaired Eating/Feeding: Set up;Sitting   Grooming: Set up;Sitting   Upper Body Bathing: Min guard;Sitting   Lower Body Bathing: Moderate assistance;Sitting/lateral leans   Upper Body Dressing : Sitting;Min guard;Set up Upper Body Dressing Details (indicate cue type and reason): Pt able to doff and don new gown with setup assist and minguard for safety Lower Body Dressing: Maximal assistance;Sitting/lateral leans       Toileting- Clothing Manipulation and Hygiene: Total assistance;Sitting/lateral lean Toileting - Clothing Manipulation Details (indicate cue type and reason): attempted rolling in bed to complete pericare however Pt adamently resisting rolling due to pain in RLE     Functional mobility during ADLs: Moderate assistance;+2 for physical assistance;+2 for safety/equipment General ADL Comments: Pt completed lateral scoot to recliner from EOB with ModA +2, Pt's mother present and offering assistance as well as Pt screaming out and initially resistive to movement due to pain in RLE                   Pertinent Vitals/Pain Pain Assessment: Faces Faces Pain Scale: Hurts whole lot Pain Location: Pt yelling/screaming out with any movement of RLE; resistive to movement of RLE; suspect partly due  to fear of pain with movement vs actual pain  Pain Descriptors / Indicators: Grimacing;Crying;Operative site guarding Pain Intervention(s): Monitored during session;Repositioned          Extremity/Trunk Assessment Upper Extremity Assessment Upper Extremity Assessment: Overall WFL for tasks assessed   Lower Extremity  Assessment Lower Extremity Assessment: Defer to PT evaluation       Communication Communication Communication: Other (comment) (Pt typically has one word responses; does not consistently respond to questions or directions)   Cognition Arousal/Alertness: Awake/alert   Overall Cognitive Status: History of cognitive impairments - at baseline                                 General Comments: Pt only able to follow short, concise directions - requires simple commands   General Comments  Pt's mother present during session                Home Living Family/patient expects to be discharged to:: Group home Living Arrangements: Group Home Available Help at Discharge: Family;Available 24 hours/day Type of Home: House Home Access: Ramped entrance     Home Layout: One level               Home Equipment: Walker - 2 wheels          Prior Functioning/Environment Level of Independence: Independent        Comments: supervision with cooking        OT Problem List: Decreased strength;Impaired balance (sitting and/or standing);Decreased range of motion;Decreased activity tolerance;Pain;Decreased safety awareness;Decreased knowledge of precautions      OT Treatment/Interventions: Self-care/ADL training;Therapeutic activities;Balance training;DME and/or AE instruction;Therapeutic exercise;Manual therapy;Cognitive remediation/compensation;Energy conservation;Patient/family education    OT Goals(Current goals can be found in the care plan section) Acute Rehab OT Goals Patient Stated Goal: return to group home OT Goal Formulation: With patient Time For Goal Achievement: 12/29/16 Potential to Achieve Goals: Good  OT Frequency: Min 2X/week               Co-evaluation PT/OT/SLP Co-Evaluation/Treatment: Yes Reason for Co-Treatment: Complexity of the patient's impairments (multi-system involvement);For patient/therapist safety;To address functional/ADL  transfers PT goals addressed during session: Mobility/safety with mobility OT goals addressed during session: ADL's and self-care      AM-PAC PT "6 Clicks" Daily Activity     Outcome Measure Help from another person eating meals?: None Help from another person taking care of personal grooming?: A Little Help from another person toileting, which includes using toliet, bedpan, or urinal?: Total Help from another person bathing (including washing, rinsing, drying)?: A Lot Help from another person to put on and taking off regular upper body clothing?: A Little Help from another person to put on and taking off regular lower body clothing?: A Lot 6 Click Score: 15   End of Session Nurse Communication: Mobility status  Activity Tolerance: Patient tolerated treatment well Patient left: in chair;with call bell/phone within reach;with chair alarm set;with family/visitor present  OT Visit Diagnosis: Unsteadiness on feet (R26.81);Other abnormalities of gait and mobility (R26.89);Pain Pain - Right/Left: Right Pain - part of body: Hip                Time: 8657-8469 OT Time Calculation (min): 25 min Charges:  OT General Charges $OT Visit: 1 Visit OT Evaluation $OT Eval Moderate Complexity: 1 Mod G-Codes:     Lou Cal, OT Pager [phone] 12/15/2016   Raymondo Band 12/15/2016, 5:05 PM

## 2016-12-15 NOTE — Progress Notes (Signed)
PROGRESS NOTE   Darrell Russo  WUJ:[id number]    DOB: 1976/01/30    DOA: 12/12/2016  PCP: Aletha Halim., PA-C   I have briefly reviewed patients previous medical records in [facility].  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   Assessment & Plan:   Principal Problem:   Hip fracture ([place]) Active Problems:   Leukemia ([place])   Essential hypertension   Autism   Right hip fracture - No report of fall or injury. Hence etiology of fracture not clear and per his primary oncologist, does not appear to be pathological fracture. - CT showed acute fracture through the base of the right femoral neck. - Orthopedics was consulted and he underwent closed reduction and percutaneous fixation of femoral neck fracture on 12/13/16. - As per orthopedics, touchdown weightbearing on right leg, Lovenox for DVT prophylaxis. - May need to consider further workup as outpatient including bone density study.25-hydroxy vitamin D: 30 (low normal). Started vitamin D supplements. - Group home will not be able to manage his postop care and hence short-term SNF at discharge.  - Only on IV when necessary pain medications. Added PO OxyIR for pain management. Bowel regimen initiated.  CML - In remission on Gleevec since 2007. Per oncologist input, continue the [place] throughout his hospitalization and in the perioperative period.  Autism/Impulse control disorder Continue home medications. Stable.  Essential hypertension Controlled. Continue metoprolol and furosemide.  Hypothyroid Continue Synthroid.  Postop acute blood loss anemia At baseline patient's hemoglobin probably  ranges in the 12 g range. This has gradually drifted down to 9.4. Follow CBC daily and transfuse if hemoglobin <7 g per DL.  Mild transaminitis - Minimal elevation of AST.? Rhabdomyolysis. Check CK and CMP in a.m. No GI symptoms reported.  Mild sinus tachycardia - Asymptomatic. Likely related to pain and acute blood loss.   DVT prophylaxis: SCD's  Code Status: Full Family Communication: Discussed in detail with patient's mother at bedside. Disposition: DC to SNF in the next 1-2 days.   Consultants:  Orthopedics   Procedures:  Closed reduction and percutaneous fixation of femoral neck fracture 12/13/16.  Antimicrobials:  None    Subjective: Patient apprehensive regarding pain and does not want to move his right lower extremity. Constipation.  ROS: As above.  Objective:  Vitals:   12/14/16 2205 12/15/16 0608 12/15/16 1507 12/15/16 1705  BP: 132/62 125/70 133/77   Pulse: (!) 113 (!) 106 (!) 106   Resp: 18 18    Temp: 99.3 F (37.4 C) 98.2 F (36.8 C) 98.4 F (36.9 C)   TempSrc: Oral Oral Oral Other (Comment)  SpO2: 100% 99% 98%   Weight:      Height:        Examination:  General exam: Young male, moderately built and obese, lying comfortably supine in bed.Stable Respiratory system: Clear to auscultation. Respiratory effort normal. Stable without change Cardiovascular system: S1 & S2 heard, RRR. No JVD, murmurs, rubs, gallops or clicks. No pedal edema.stable without change Gastrointestinal system: Abdomen is nondistended, soft and nontender. No organomegaly or masses felt. Normal bowel sounds heard. Stable. Central nervous system: Alert and oriented. No focal neurological deficits. Extremities: Symmetric 5  x 5 power. Right hip postop dressing site clean and dry. Mild postop edema of right thigh. No features to suggest hematoma. Skin: No rashes, lesions or ulcers Psychiatry: Judgement and insight impaired. Mood & affect flat.     Data Reviewed: I have personally  reviewed following labs and imaging studies  CBC:  Recent Labs Lab 12/12/16 1423 12/13/16 0444 12/14/16 0332 12/15/16 0619  WBC 8.5 7.2 7.7 9.9  NEUTROABS 6.1  --   --   --   HGB 11.4* 10.6* 10.8* 9.4*  HCT 33.6* 31.4* 32.5* 28.7*  MCV 99.4 98.7 100.0 101.1*  PLT 190 170 180 355   Basic Metabolic Panel:  Recent Labs Lab 12/12/16 1423 12/13/16 0444 12/15/16 0619  NA 140 138 137  K 4.2 3.9 3.6  CL 105 104 105  CO2 27 25 25   GLUCOSE 100* 88 170*  BUN 27* 24* 17  CREATININE 1.09 1.17 1.06  CALCIUM 10.1 9.1 8.6*   Liver Function Tests:  Recent Labs Lab 12/12/16 1423 12/13/16 0444 12/15/16 0619  AST 136* 109* 55*  ALT 43 35 36  ALKPHOS 157* 120 123  BILITOT 0.5 0.4 0.4  PROT 7.7 6.1* 6.2*  ALBUMIN 4.3 3.4* 3.2*   Coagulation Profile:  Recent Labs Lab 12/12/16 1423 12/13/16 0444  INR 1.10 1.13     Recent Results (from the past 240 hour(s))  Surgical PCR screen     Status: None   Collection Time: 12/13/16  9:51 AM  Result Value Ref Range Status   MRSA, PCR NEGATIVE NEGATIVE Final   Staphylococcus aureus NEGATIVE NEGATIVE Final    Comment: (NOTE) The Xpert SA Assay (FDA approved for NASAL specimens in patients 49 years of age and older), is one component of a comprehensive surveillance program. It is not intended to diagnose infection nor to guide or monitor treatment.          Radiology Studies: Dg C-arm 61-120 Min  Result Date: 12/13/2016 CLINICAL DATA:  Closed reduction followed by cannulated hip pinning of right hip fracture. EXAM: DG C-ARM 61-120 MIN; OPERATIVE RIGHT HIP WITH PELVIS COMPARISON:  None. FINDINGS: A total of 5 minutes 26 seconds of fluoroscopic time was utilized during placement of 3 cannulated screws across the known basicervical comminuted fracture of the right femur. No joint dislocation. Fine bony detail is limited by the C-arm fluoroscopic technique. Alignment has improved with less varus angulation noted. IMPRESSION:  Near-anatomic alignment of right basicervical comminuted fracture traversed by 3 cannulated screws as seen fluoroscopically. Electronically Signed   By: Ashley Royalty M.D.   On: 12/13/2016 22:19   Dg Hip Operative Unilat W Or W/o Pelvis Right  Result Date: 12/13/2016 CLINICAL DATA:  Closed reduction followed by cannulated hip pinning of right hip fracture. EXAM: DG C-ARM 61-120 MIN; OPERATIVE RIGHT HIP WITH PELVIS COMPARISON:  None. FINDINGS: A total of 5 minutes 26 seconds of fluoroscopic time was utilized during placement of 3 cannulated screws across the known basicervical comminuted fracture of the right femur. No joint dislocation. Fine bony detail is limited by the C-arm fluoroscopic technique. Alignment has improved with less varus angulation noted. IMPRESSION: Near-anatomic alignment of right basicervical comminuted fracture traversed by 3 cannulated screws as seen fluoroscopically. Electronically Signed   By: Ashley Royalty M.D.   On: 12/13/2016 22:19   Dg Hip Unilat With Pelvis 2-3 Views Right  Result Date: 12/13/2016 CLINICAL DATA:  Right femoral neck fixation post fracture. EXAM: DG HIP (WITH OR WITHOUT PELVIS) 2-3V RIGHT COMPARISON:  Preoperative radiographs from 12/12/2016. FINDINGS: A comminuted basicervical fracture of the right femur traversed by 3 fixation screws is now noted. Alignment is near anatomic. Joint space narrowing of both hips. Intact pelvis. IMPRESSION: Interval fixation of right femoral neck fracture with 3 screws. Near-anatomic alignment noted. Electronically Signed   By: Ashley Royalty M.D.   On: 12/13/2016 22:23        Scheduled Meds: . divalproex  2,000 mg Oral Daily  . enoxaparin (LOVENOX) injection  55 mg Subcutaneous Q24H  . furosemide  20 mg Oral Daily  . haloperidol  1.5 mg Oral Daily  . imatinib  100 mg Oral BID WC  . levothyroxine  225 mcg Oral QAC breakfast  . metoprolol succinate  50 mg Oral Daily  . minocycline  100 mg Oral BID  . polyethylene glycol   17 g Oral BID  . QUEtiapine  800 mg Oral QHS  . senna  2 tablet Oral Daily  . zonisamide  300 mg Oral Daily   Continuous Infusions:    LOS: 3 days     Zaylee Cornia, MD, FACP, FHM. [facility] Pager [phone]  If 7PM-7AM, please contact night-coverage [email or web address] Password [email or web address] 12/15/2016, 5:15 PM

## 2016-12-15 NOTE — Progress Notes (Signed)
Physical Therapy Treatment Patient Details Name: Darrell Russo MRN: [id number] DOB: 11-18-1975 Today's Date: 12/15/2016    History of Present Illness 41 y.o. male with medical history significant of autism, HTN, leukemia presenting with a R hip fracture s/p closed reduction with percutaneous screws    PT Comments    Pt requires short simple commands and performs functional mobility better with mother present.  Pt combative with facilitation at times but able to calm down with his mother present.  Plan next session for continued functional mobility and family education.    Follow Up Recommendations  Home health PT;Supervision/Assistance - 24 hour     Equipment Recommendations  Hospital bed;Other (comment);Wheelchair cushion (measurements PT);Wheelchair (measurements PT) (drop arm BSC hoyer lift)    Recommendations for Other Services       Precautions / Restrictions Precautions Precautions: Fall Restrictions Weight Bearing Restrictions: Yes RLE Weight Bearing: Touchdown weight bearing    Mobility  Bed Mobility Overal bed mobility: Needs Assistance Bed Mobility: Supine to Sit     Supine to sit: +2 for safety/equipment;HOB elevated     General bed mobility comments: assist to bring trunk into upright position and to advance LEs over EOB with use of chuck pad  Transfers Overall transfer level: Needs assistance   Transfers: Lateral/Scoot Transfers          Lateral/Scoot Transfers: Mod assist;+2 safety/equipment;+2 physical assistance General transfer comment: Pt completed lateral transfer as Pt unable to maintain TDWB for sit<>stand; transferred EOB to drop arm recliner with ModA +2 using chuck pad to the left, requires multimodal cues to initiate transfer and for hand placement during transfer completion   Ambulation/Gait             General Gait Details: unable   Stairs            Wheelchair Mobility    Modified Rankin (Stroke Patients Only)        Balance Overall balance assessment: Needs assistance Sitting-balance support: Feet supported Sitting balance-Leahy Scale: Good                                      Cognition Arousal/Alertness: Awake/alert Behavior During Therapy: Flat affect Overall Cognitive Status: History of cognitive impairments - at baseline                                 General Comments: Pt only able to follow short, concise directions - requires simple commands      Exercises      General Comments General comments (skin integrity, edema, etc.): Pt's mother present during session       Pertinent Vitals/Pain Pain Assessment: 0-10 Faces Pain Scale: Hurts whole lot Pain Location: Pt yelling/screaming out with any movement of RLE; resistive to movement of RLE; suspect partly due to fear of pain with movement vs actual pain  Pain Descriptors / Indicators: Grimacing;Crying;Operative site guarding Pain Intervention(s): Monitored during session;Repositioned    Home Living Family/patient expects to be discharged to:: Group home Living Arrangements: Group Home Available Help at Discharge: Family;Available 24 hours/day Type of Home: House Home Access: Ramped entrance   Home Layout: One level Home Equipment: Walker - 2 wheels      Prior Function Level of Independence: Independent      Comments: supervision with cooking   PT Goals (current goals can now  be found in the care plan section) Acute Rehab PT Goals Patient Stated Goal: return to group home Potential to Achieve Goals: Fair Progress towards PT goals: Progressing toward goals    Frequency    Min 4X/week      PT Plan Current plan remains appropriate    Co-evaluation PT/OT/SLP Co-Evaluation/Treatment: Yes Reason for Co-Treatment: Complexity of the patient's impairments (multi-system involvement);For patient/therapist safety;To address functional/ADL transfers PT goals addressed during session:  Mobility/safety with mobility OT goals addressed during session: ADL's and self-care      AM-PAC PT "6 Clicks" Daily Activity  Outcome Measure  Difficulty turning over in bed (including adjusting bedclothes, sheets and blankets)?: Unable Difficulty moving from lying on back to sitting on the side of the bed? : Unable Difficulty sitting down on and standing up from a chair with arms (e.g., wheelchair, bedside commode, etc,.)?: Unable Help needed moving to and from a bed to chair (including a wheelchair)?: A Lot Help needed walking in hospital room?: Total Help needed climbing 3-5 steps with a railing? : Total 6 Click Score: 7    End of Session Equipment Utilized During Treatment: Gait belt Activity Tolerance: Patient tolerated treatment well Patient left: in chair;with chair alarm set;with call bell/phone within reach;with family/visitor present Nurse Communication: Mobility status PT Visit Diagnosis: Difficulty in walking, not elsewhere classified (R26.2);Muscle weakness (generalized) (M62.81);Pain;Other abnormalities of gait and mobility (R26.89) Pain - Right/Left: Right Pain - part of body: Leg     Time: 6948-5462 PT Time Calculation (min) (ACUTE ONLY): 27 min  Charges:  $Therapeutic Activity: 8-22 mins                    G Codes:       Governor Rooks, PTA pager [phone]    Cristela Blue 12/15/2016, 5:23 PM

## 2016-12-16 LAB — CBC
HCT: 29.3 % — ABNORMAL LOW (ref 39.0–52.0)
HEMOGLOBIN: 9.5 g/dL — AB (ref 13.0–17.0)
MCH: 32.5 pg (ref 26.0–34.0)
MCHC: 32.4 g/dL (ref 30.0–36.0)
MCV: 100.3 fL — ABNORMAL HIGH (ref 78.0–100.0)
Platelets: 211 10*3/uL (ref 150–400)
RBC: 2.92 MIL/uL — AB (ref 4.22–5.81)
RDW: 14.7 % (ref 11.5–15.5)
WBC: 8 10*3/uL (ref 4.0–10.5)

## 2016-12-16 MED ORDER — HYDROCORTISONE 1 % EX CREA
TOPICAL_CREAM | Freq: Four times a day (QID) | CUTANEOUS | Status: DC | PRN
  Administered 2016-12-17: via TOPICAL
  Filled 2016-12-16: qty 28

## 2016-12-16 MED ORDER — IMATINIB MESYLATE 100 MG PO TABS
200.0000 mg | ORAL_TABLET | Freq: Two times a day (BID) | ORAL | Status: DC
  Administered 2016-12-16 – 2016-12-18 (×4): 200 mg via ORAL
  Filled 2016-12-16 (×5): qty 2

## 2016-12-16 MED ORDER — LORATADINE 10 MG PO TABS: 10.0000 mg | ORAL_TABLET | Freq: Every day | ORAL | Status: DC

## 2016-12-16 NOTE — Progress Notes (Signed)
Contacted SW per MD request about pt's discharge. SW stated that she "will look into it."

## 2016-12-16 NOTE — Progress Notes (Signed)
PROGRESS NOTE   Darrell Russo  XHB:[id number]    DOB: 1975-10-12    DOA: 12/12/2016  PCP: Aletha Halim., PA-C   I have briefly reviewed patients previous medical records in [facility].  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   Assessment & Plan:   Principal Problem:   Hip fracture ([place]) Active Problems:   Leukemia ([place])   Essential hypertension   Autism   Right hip fracture - No report of fall or injury. Hence etiology of fracture not clear and per his primary oncologist, does not appear to be pathological fracture. - CT showed acute fracture through the base of the right femoral neck. - Orthopedics was consulted and he underwent closed reduction and percutaneous fixation of femoral neck fracture on 12/13/16. - As per orthopedics, touchdown weightbearing on right leg, Lovenox for DVT prophylaxis. - May need to consider further workup as outpatient including bone density study.25-hydroxy vitamin D: 30 (low normal). Started vitamin D supplements. - Group home will not be able to manage his postop care and hence short-term SNF at discharge.  - Only on IV when necessary pain medications. Added PO OxyIR for pain management. Bowel regimen initiated. - Pain better controlled. Patient however reluctant to mobilize due to pain complicated by cognitive impairment. Mother assisting with mobilization which is helping.  CML - In remission on Gleevec since 2007. Per oncologist input, continue the [place] throughout his hospitalization and in the perioperative period.  Autism/Impulse control disorder Continue home medications. Stable.  Essential  hypertension Controlled. Continue metoprolol and furosemide.  Hypothyroid Continue Synthroid.  Postop acute blood loss anemia At baseline patient's hemoglobin probably ranges in the 12 g range. This has gradually drifted down to 9.4. Follow CBC daily and transfuse if hemoglobin <7 g per DL. Hemoglobin stable in the mid 9 g range for the last 2 days.  Mild transaminitis - Minimal elevation of AST.? Rhabdomyolysis. CK 1186.  Mild sinus tachycardia - Asymptomatic. Likely related to pain and acute blood loss.   DVT prophylaxis: SCD's  Code Status: Full Family Communication: Discussed in detail with patient's mother at bedside. Disposition: DC to SNF when bed available. Social worker consulted.   Consultants:  Orthopedics   Procedures:  Closed reduction and percutaneous fixation of femoral neck fracture 12/13/16.  Antimicrobials:  None    Subjective: Difficult history from patient due to cognitive impairment. As per mother, patient is reluctant to move his right lower extremity due to pain but she was able to stand him up and sit in one bedside commode yesterday. Passing flatus. No BM yet. No other complaints reported.  ROS: As above.  Objective:  Vitals:   12/15/16 0608 12/15/16 1507 12/15/16 1705 12/15/16 2126  BP: 125/70 133/77  131/76  Pulse: (!) 106 (!) 106  87  Resp: 18   16  Temp: 98.2 F (36.8 C) 98.4 F (36.9 C)  98.1 F (36.7 C)  TempSrc: Oral Oral Other (Comment) Oral  SpO2: 99% 98%  99%  Weight:      Height:        Examination:  General exam: Young male, moderately built and obese, sitting up comfortably in chair this  morning. Respiratory system: Clear to auscultation. Respiratory effort normal. Stable without change. Cardiovascular system: S1 & S2 heard, RRR. No JVD, murmurs, rubs, gallops or clicks. No pedal edema.stable without change Gastrointestinal system: Abdomen is nondistended, soft and nontender. No organomegaly or masses felt. Normal bowel  sounds heard. Stable. Central nervous system: Alert and oriented. No focal neurological deficits. Extremities: Symmetric 5 x 5 power. Right hip postop dressing site clean and dry. Mild postop edema of right thigh-decreasing. No features to suggest hematoma. Skin: No rashes, lesions or ulcers Psychiatry: Judgement and insight impaired. Mood & affect flat.     Data Reviewed: I have personally reviewed following labs and imaging studies  CBC:  Recent Labs Lab 12/12/16 1423 12/13/16 0444 12/14/16 0332 12/15/16 0619 12/16/16 0624  WBC 8.5 7.2 7.7 9.9 8.0  NEUTROABS 6.1  --   --   --   --   HGB 11.4* 10.6* 10.8* 9.4* 9.5*  HCT 33.6* 31.4* 32.5* 28.7* 29.3*  MCV 99.4 98.7 100.0 101.1* 100.3*  PLT 190 170 180 187 932   Basic Metabolic Panel:  Recent Labs Lab 12/12/16 1423 12/13/16 0444 12/15/16 0619  NA 140 138 137  K 4.2 3.9 3.6  CL 105 104 105  CO2 27 25 25   GLUCOSE 100* 88 170*  BUN 27* 24* 17  CREATININE 1.09 1.17 1.06  CALCIUM 10.1 9.1 8.6*   Liver Function Tests:  Recent Labs Lab 12/12/16 1423 12/13/16 0444 12/15/16 0619  AST 136* 109* 55*  ALT 43 35 36  ALKPHOS 157* 120 123  BILITOT 0.5 0.4 0.4  PROT 7.7 6.1* 6.2*  ALBUMIN 4.3 3.4* 3.2*   Coagulation Profile:  Recent Labs Lab 12/12/16 1423 12/13/16 0444  INR 1.10 1.13     Recent Results (from the past 240 hour(s))  Surgical PCR screen     Status: None   Collection Time: 12/13/16  9:51 AM  Result Value Ref Range Status   MRSA, PCR NEGATIVE NEGATIVE Final   Staphylococcus aureus NEGATIVE NEGATIVE Final    Comment: (NOTE) The Xpert SA Assay (FDA approved for NASAL specimens in patients 20 years of age and older), is one component of a comprehensive surveillance program. It is not intended to diagnose infection nor to guide or monitor treatment.          Radiology Studies: No results found.      Scheduled Meds: . divalproex  2,000 mg Oral Daily  . enoxaparin (LOVENOX) injection   55 mg Subcutaneous Q24H  . furosemide  20 mg Oral Daily  . haloperidol  1.5 mg Oral Daily  . imatinib  100 mg Oral BID WC  . levothyroxine  225 mcg Oral QAC breakfast  . metoprolol succinate  50 mg Oral Daily  . minocycline  100 mg Oral BID  . polyethylene glycol  17 g Oral BID  . QUEtiapine  800 mg Oral QHS  . senna  2 tablet Oral Daily  . zonisamide  300 mg Oral Daily   Continuous Infusions:    LOS: 4 days     Jasier Calabretta, MD, FACP, FHM. [facility] Pager [phone]  If 7PM-7AM, please contact night-coverage [email or web address] Password [email or web address] 12/16/2016, 1:10 PM

## 2016-12-17 LAB — TSH: TSH: 31.27 u[IU]/mL — ABNORMAL HIGH (ref 0.350–4.500)

## 2016-12-17 MED ORDER — LORATADINE 10 MG PO TABS
10.0000 mg | ORAL_TABLET | Freq: Every day | ORAL | Status: DC | PRN
  Administered 2016-12-17: 10 mg via ORAL
  Filled 2016-12-17: qty 1

## 2016-12-17 MED ORDER — MAGNESIUM HYDROXIDE 400 MG/5ML PO SUSP
30.0000 mL | Freq: Once | ORAL | Status: AC
  Administered 2016-12-17: 30 mL via ORAL
  Filled 2016-12-17: qty 30

## 2016-12-17 NOTE — Progress Notes (Signed)
PROGRESS NOTE   Darrell Russo  FAO:[id number]    DOB: 1975-12-22    DOA: 12/12/2016  PCP: Aletha Halim., PA-C   I have briefly reviewed patients previous medical records in [facility].  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   Assessment & Plan:   Principal Problem:   Hip fracture ([place]) Active Problems:   Leukemia ([employer])   Essential hypertension   Autism   Right hip fracture - No report of fall or injury. Hence etiology of fracture not clear and per his primary oncologist, does not appear to be pathological fracture. - CT showed acute fracture through the base of the right femoral neck. - Orthopedics was consulted and he underwent closed reduction and percutaneous fixation of femoral neck fracture on 12/13/16. - As per orthopedics, touchdown weightbearing on right leg, Lovenox for DVT prophylaxis. - May need to consider further workup as outpatient including bone density study.25-hydroxy vitamin D: 30 (low normal). Started vitamin D supplements. - Group home will not be able to manage his postop care and hence short-term SNF at discharge.  - Pain better controlled. Patient however reluctant to mobilize due to pain complicated by cognitive impairment. Mother assisting with mobilization which is helping.No change.  CML - In remission on Gleevec since 2007. Per oncologist input, continue the [place] throughout his hospitalization and in the perioperative period.  Autism/Impulse control disorder Continue home medications. Stable.  Essential hypertension Controlled. Continue metoprolol and furosemide.  Hypothyroid Continue  Synthroid.  Postop acute blood loss anemia At baseline patient's hemoglobin probably ranges in the 12 g range. This has gradually drifted down to 9.4. Follow CBC daily and transfuse if hemoglobin <7 g per DL. Hemoglobin stable in the mid 9 g range for the last 2 days.  Mild transaminitis - Minimal elevation of AST.? Rhabdomyolysis. CK 1186.  Mild sinus tachycardia - Asymptomatic. Likely related to pain and acute blood loss. Check TSH.  Constipation - Passing flatus but no BM despite initiation of bowel regimen. Trial of milk of magnesia. As per mother, patient would not do well with Dulcolax suppository.   DVT prophylaxis: SCD's  Code Status: Full Family Communication: Discussed in detail with patient's mother at bedside. Disposition: DC to SNF when bed available. I discussed with social worker who indicated that no SNF possibly until tomorrow.  Consultants:  Orthopedics   Procedures:  Closed reduction and percutaneous fixation of femoral neck fracture 12/13/16.  Antimicrobials:  None    Subjective: Patient able to get up from bed to chair with assistance from other. Flatus + +. No BM.  ROS: As above.  Objective:  Vitals:   12/16/16 1334 12/16/16 2052 12/17/16 0526 12/17/16 1055  BP: 129/84 139/88 114/66 134/83  Pulse: (!) 103 (!) 104 (!) 110 (!) 110  Resp: 19 16 16    Temp: 99.1 F (37.3 C) 98.3 F (36.8 C) 98.2 F (36.8 C)   TempSrc: Oral Oral Oral   SpO2: 100% 99% 97% 99%  Weight:      Height:        Examination:  General exam: Young male, moderately built and obese, sitting up comfortably in chair this morning. Respiratory system: Clear  to auscultation. Respiratory effort normal. Stable without change. Cardiovascular system: S1 & S2 heard, RRR. No JVD, murmurs, rubs, gallops or clicks. No pedal edema.stable without change Gastrointestinal system: Abdomen is nondistended, soft and nontender. No organomegaly or masses felt. Normal bowel sounds heard.  Stable. Central nervous system: Alert and oriented. No focal neurological deficits. Extremities: Symmetric 5 x 5 power. Right hip postop dressing site clean and dry. Mild postop edema of right thigh-decreasing. No features to suggest hematoma. Skin: No rashes, lesions or ulcers Psychiatry: Judgement and insight impaired. Mood & affect flat.     Data Reviewed: I have personally reviewed following labs and imaging studies  CBC:  Recent Labs Lab 12/12/16 1423 12/13/16 0444 12/14/16 0332 12/15/16 0619 12/16/16 0624  WBC 8.5 7.2 7.7 9.9 8.0  NEUTROABS 6.1  --   --   --   --   HGB 11.4* 10.6* 10.8* 9.4* 9.5*  HCT 33.6* 31.4* 32.5* 28.7* 29.3*  MCV 99.4 98.7 100.0 101.1* 100.3*  PLT 190 170 180 187 545   Basic Metabolic Panel:  Recent Labs Lab 12/12/16 1423 12/13/16 0444 12/15/16 0619  NA 140 138 137  K 4.2 3.9 3.6  CL 105 104 105  CO2 27 25 25   GLUCOSE 100* 88 170*  BUN 27* 24* 17  CREATININE 1.09 1.17 1.06  CALCIUM 10.1 9.1 8.6*   Liver Function Tests:  Recent Labs Lab 12/12/16 1423 12/13/16 0444 12/15/16 0619  AST 136* 109* 55*  ALT 43 35 36  ALKPHOS 157* 120 123  BILITOT 0.5 0.4 0.4  PROT 7.7 6.1* 6.2*  ALBUMIN 4.3 3.4* 3.2*   Coagulation Profile:  Recent Labs Lab 12/12/16 1423 12/13/16 0444  INR 1.10 1.13     Recent Results (from the past 240 hour(s))  Surgical PCR screen     Status: None   Collection Time: 12/13/16  9:51 AM  Result Value Ref Range Status   MRSA, PCR NEGATIVE NEGATIVE Final   Staphylococcus aureus NEGATIVE NEGATIVE Final    Comment: (NOTE) The Xpert SA Assay (FDA approved for NASAL specimens in patients 77 years of age and older), is one component of a comprehensive surveillance program. It is not intended to diagnose infection nor to guide or monitor treatment.          Radiology Studies: No results found.      Scheduled Meds: . divalproex  2,000 mg Oral Daily  . enoxaparin (LOVENOX) injection  55 mg  Subcutaneous Q24H  . furosemide  20 mg Oral Daily  . haloperidol  1.5 mg Oral Daily  . imatinib  200 mg Oral BID WC  . levothyroxine  225 mcg Oral QAC breakfast  . metoprolol succinate  50 mg Oral Daily  . minocycline  100 mg Oral BID  . polyethylene glycol  17 g Oral BID  . QUEtiapine  800 mg Oral QHS  . senna  2 tablet Oral Daily  . zonisamide  300 mg Oral Daily   Continuous Infusions:    LOS: 5 days     HONGALGI,ANAND, MD, FACP, FHM. [facility] Pager [phone]  If 7PM-7AM, please contact night-coverage [email or web address] Password [email or web address] 12/17/2016, 2:32 PM

## 2016-12-18 LAB — CBC
HCT: 30.5 % — ABNORMAL LOW (ref 39.0–52.0)
HEMOGLOBIN: 10.4 g/dL — AB (ref 13.0–17.0)
MCH: 34.1 pg — ABNORMAL HIGH (ref 26.0–34.0)
MCHC: 34.1 g/dL (ref 30.0–36.0)
MCV: 100 fL (ref 78.0–100.0)
Platelets: 220 10*3/uL (ref 150–400)
RBC: 3.05 MIL/uL — AB (ref 4.22–5.81)
RDW: 14.9 % (ref 11.5–15.5)
WBC: 6.4 10*3/uL (ref 4.0–10.5)

## 2016-12-18 LAB — T4, FREE: FREE T4: 0.78 ng/dL (ref 0.61–1.12)

## 2016-12-18 MED ORDER — ENOXAPARIN SODIUM 60 MG/0.6ML ~~LOC~~ SOLN: 55.0000 mg | SUBCUTANEOUS | 0 refills | Status: DC

## 2016-12-18 MED ORDER — POLYETHYLENE GLYCOL 3350 17 G PO PACK: 17.0000 g | PACK | Freq: Two times a day (BID) | ORAL | 0 refills | Status: DC

## 2016-12-18 MED ORDER — SENNA 8.6 MG PO TABS: 2.0000 | ORAL_TABLET | Freq: Every day | ORAL | 0 refills | Status: DC | PRN

## 2016-12-18 MED ORDER — OXYCODONE HCL 5 MG PO TABS: 5.0000 mg | ORAL_TABLET | Freq: Four times a day (QID) | ORAL | 0 refills | Status: DC | PRN

## 2016-12-18 NOTE — NC FL2 (Signed)
[place] LEVEL OF CARE SCREENING TOOL     IDENTIFICATION  Patient Name: Darrell Russo Birthdate: May 17, 1975 Sex: male Admission Date (Current Location): 12/12/2016  [facility] [place] Number:  Herbalist and Address:  [facility], [place] [address] [place], [place] [address]      Provider Number: [id number]  Attending Physician Name and Address:  Modena Jansky, MD  Relative Name and Phone Number:  Tamala Ser, mother, [phone]    Current Level of Care: Hospital Recommended Level of Care: [place] Prior Approval Number:    Date Approved/Denied:   PASRR Number: [id number]  Discharge Plan: SNF    Current Diagnoses: Patient Active Problem List   Diagnosis Date Noted  . Hip fracture ([place]) 12/12/2016  . Leukemia ([place]) 12/12/2016  . Essential hypertension 12/12/2016  . Autism 12/12/2016  . GI BLEEDING 06/18/2008    Orientation RESPIRATION BLADDER Height & Weight     Self, Situation, Place  Normal Continent, External catheter Weight: 260 lb (117.9 kg) Height:  6\' 1"  (185.4 cm)  BEHAVIORAL SYMPTOMS/MOOD NEUROLOGICAL BOWEL NUTRITION STATUS      Continent Diet (please see DC summary)  AMBULATORY STATUS COMMUNICATION OF NEEDS Skin   Extensive Assist Verbally (communication difficulties due to autism) Surgical wounds (closed incision R hip, foam dressing)                       Personal Care Assistance Level of Assistance  Bathing, Feeding, Dressing Bathing Assistance: Maximum assistance Feeding assistance: Limited assistance Dressing Assistance: Maximum assistance     Functional Limitations Info  Sight, Hearing, Speech Sight Info: Adequate Hearing Info: Adequate Speech Info:  (developmentally delayed)    SPECIAL CARE FACTORS FREQUENCY  PT (By licensed PT), OT (By licensed OT)     PT Frequency: 5x/week OT Frequency: 5x/week            Contractures      Additional Factors  Info  Code Status, Allergies, Psychotropic Code Status Info: Full Allergies Info: No Known Allergies Psychotropic Info: depakote, haldol, seroquel         Current Medications (12/18/2016):  This is the current hospital active medication list Current Facility-Administered Medications  Medication Dose Route Frequency Provider Last Rate Last Dose  . acetaminophen (TYLENOL) tablet 650 mg  650 mg Oral Q6H PRN Elodia Florence., MD   650 mg at 12/17/16 1615   Or  . acetaminophen (TYLENOL) suppository 650 mg  650 mg Rectal Q6H PRN Elodia Florence., MD      . cyclobenzaprine (FLEXERIL) tablet 10 mg  10 mg Oral TID PRN Elodia Florence., MD   10 mg at 12/17/16 1614  . divalproex (DEPAKOTE ER) 24 hr tablet 2,000 mg  2,000 mg Oral Daily Elodia Florence., MD   2,000 mg at 12/17/16 2111  . enoxaparin (LOVENOX) injection 55 mg  55 mg Subcutaneous Q24H Hongalgi, Anand D, MD   55 mg at 12/17/16 2112  . furosemide (LASIX) tablet 20 mg  20 mg Oral Daily Elodia Florence., MD   20 mg at 12/17/16 1057  . haloperidol (HALDOL) tablet 1.5 mg  1.5 mg Oral Daily Elodia Florence., MD   1.5 mg at 12/17/16 1614  . hydrocortisone cream 1 %   Topical QID PRN Modena Jansky, MD      . imatinib (GLEEVEC) tablet 200 mg  200 mg Oral BID  WC Hongalgi, Lenis Dickinson, MD   200 mg at 12/17/16 1945  . levothyroxine (SYNTHROID, LEVOTHROID) tablet 225 mcg  225 mcg Oral QAC breakfast Elodia Florence., MD   225 mcg at 12/18/16 [phone]  . loratadine (CLARITIN) tablet 10 mg  10 mg Oral Daily PRN Modena Jansky, MD   10 mg at 12/17/16 0020  . metoprolol succinate (TOPROL-XL) 24 hr tablet 50 mg  50 mg Oral Daily Elodia Florence., MD   50 mg at 12/17/16 1057  . minocycline (MINOCIN,DYNACIN) capsule 100 mg  100 mg Oral BID Elodia Florence., MD   100 mg at 12/17/16 2111  . morphine 4 MG/ML injection 2 mg  2 mg Intravenous Q4H PRN Elodia Florence., MD   2 mg at 12/14/16 2205  . oxyCODONE  (Oxy IR/ROXICODONE) immediate release tablet 5-10 mg  5-10 mg Oral Q6H PRN Modena Jansky, MD   10 mg at 12/17/16 2110  . polyethylene glycol (MIRALAX / GLYCOLAX) packet 17 g  17 g Oral BID Modena Jansky, MD   17 g at 12/17/16 1057  . QUEtiapine (SEROQUEL XR) 24 hr tablet 800 mg  800 mg Oral QHS Elodia Florence., MD   800 mg at 12/17/16 2111  . senna (SENOKOT) tablet 17.2 mg  2 tablet Oral Daily Hongalgi, Anand D, MD   17.2 mg at 12/17/16 1057  . zonisamide (ZONEGRAN) capsule 300 mg  300 mg Oral Daily Elodia Florence., MD   300 mg at 12/17/16 2110     Discharge Medications: Please see discharge summary for a list of discharge medications.  Relevant Imaging Results:  Relevant Lab Results:   Additional Information SSN: [id number]  Estanislado Emms, LCSW

## 2016-12-18 NOTE — Progress Notes (Signed)
CSW discussed discharge planning with patient and mother at bedside. Mother preferred for patient to go home with home health rather than SNF. Due to having Medicaid, patient would be required to stay at [facility] for 30 days. Mother concerned patient would lose his bed at group home if he stayed in SNF for this long. Mother indicated she can provide 24 hour care.   CSW consulted with RNCM. RNCM to support with home health needs. RNCM discussed with patient and mother and patient will discharge directly back to group home with home health services. Patient will be transported back to group home by group home staff.  CSW signing off.  Estanislado Emms, [place]

## 2016-12-18 NOTE — Care Management Note (Signed)
Case Management Note  Patient Details  Name: Darrell Russo MRN: [id number] Date of Birth: 02-Jun-1975  Subjective/Objective:     41 yr old gentleman, s/p right Hip ORIF.               Action/Plan: Case manager has been discussing discharge plan with patient's mom, He is severely autistic., very pleasant. At this time patient will return his group Home, [facility], [place] has contacted the care taker, Maudie Mercury to inform her that patient will discharge with home health. Case manager has ordered DME, will contact Nurse for [name], [phone].   Expected Discharge Date:  12/18/16               Expected Discharge Plan:  [place] (will return to group home)  In-House Referral:  NA  Discharge planning Services  CM Consult  Post Acute Care Choice:  Durable Medical Equipment, Home Health Choice offered to:  Parent  DME Arranged:  3-N-1, Wheelchair manual, Walker rolling DME Agency:  [place]:  PT [facility] Agency:  [place]  Status of Service:  Completed, signed off  If discussed at [place] of Stay Meetings, dates discussed:    Additional Comments:  Ninfa Meeker, RN 12/18/2016, 12:54 PM

## 2016-12-18 NOTE — Clinical Social Work Note (Signed)
Clinical Social Work Assessment  Patient Details  Name: Darrell Russo MRN: [id number] Date of Birth: 01/03/1976  Date of referral:  12/18/16               Reason for consult:  Facility Placement                Permission sought to share information with:  Facility Sport and exercise psychologist, Family Supports Permission granted to share information::  Yes, Verbal Permission Granted (permission given by mother, legal gaurdian)  Name::     Tamala Ser  Agency::  SNFs  Relationship::  mother  Contact Information:  [phone]  Housing/Transportation Living arrangements for the past 2 months:  [place] of Information:  Parent Patient Interpreter Needed:  None Criminal Activity/Legal Involvement Pertinent to Current Situation/Hospitalization:  No - Comment as needed Significant Relationships:  Parents Lives with:  Facility Resident (group home) Do you feel safe going back to the place where you live?  Yes Need for family participation in patient care:  Yes (Comment)  Care giving concerns: Patient from group home. Mother at bedside and is legal guardian. Pt has autism.    Social Worker assessment / plan: CSW spoke to mother via phone and discussed recommendation for SNF. Mother consented to send referrals to SNFs. Mother reported patient mobility has improved over the weekend. Mother wondering if patient improving if he may be able to come home with her or return to group home with home health following. Mother to provide 24 hour care if patient returns home. CSW sent out initial SNF referrals and will follow for disposition planning.  Employment status:  Disabled (Comment on whether or not currently receiving Disability) Insurance information:  Medicaid In [place] PT Recommendations:  [place] / Referral to community resources:  [place]  Patient/Family's Response to care: Mother appreciative of care.  Patient/Family's Understanding of  and Emotional Response to Diagnosis, Current Treatment, and Prognosis: Mother with very good understanding of patient's conditions and hopeful for continued mobility improvement. Mother open to SNF but also wondering if patient may be able to return to group home or home with her with home health following.  Emotional Assessment Appearance:  Appears stated age Attitude/Demeanor/Rapport:  Unable to Assess Affect (typically observed):  Unable to Assess Orientation:  Oriented to Self, Oriented to Place, Oriented to Situation Alcohol / Substance use:  Not Applicable Psych involvement (Current and /or in the community):  No (Comment)  Discharge Needs  Concerns to be addressed:  Discharge Planning Concerns Readmission within the last 30 days:  No Current discharge risk:  Physical Impairment, Cognitively Impaired Barriers to Discharge:  Continued Medical Work up   Estanislado Emms, LCSW 12/18/2016, 10:47 AM

## 2016-12-18 NOTE — Progress Notes (Signed)
Patient discharged home with mother and group home liaison. Patient given pain medication and haldol prior to departure. Prescriptions were also given to mother. No further questions at this time.

## 2016-12-18 NOTE — Progress Notes (Addendum)
    Durable Medical Equipment        Start     Ordered   12/18/16 1251  For home use only DME lightweight manual wheelchair with seat cushion  Once    Comments:  Patient suffers from right hip ORIF  which impairs their ability to perform daily activities like ambulating in the home.  A cane  will not resolve  issue with performing activities of daily living. A wheelchair will allow patient to safely perform daily activities. Patient is not gerenralized Patient can self propel in the lightweight wheelchair as he is unable to ambulate due to inability to maintain weight bearing restriction. Accessories: elevating leg rests (ELRs), wheel locks, extensions and anti-tippers.   12/18/16 1252   12/18/16 1241  For home use only DME Walker rolling  Once    Question:  Patient needs a walker to treat with the following condition  Answer:  S/P right hip fracture for transfers to and from the [facility].     12/18/16 1248   12/18/16 1240  For home use only DME 3 n 1  Once     12/18/16 [place], PTA pager [phone]

## 2016-12-18 NOTE — Discharge Summary (Signed)
Physician Discharge Summary  Darrell Russo:[id number] DOB: 1975/05/03  PCP: Aletha Halim., PA-C  Admit date: 12/12/2016 Discharge date: 12/18/2016  Recommendations for Outpatient Follow-up:  1. Bing Matter, PA-C/PCP in 1 week with repeat labs. Please evaluate regarding his hypothyroid medication adjustment during that follow-up. 2. Dr. Lennette Bihari [name], Orthopedics in 3 weeks.  Home Health: PT Equipment/Devices: 3 in 1, manual wheelchair & rolling walker.    Discharge Condition: Improved and stable  CODE STATUS: Full  Diet recommendation: Heart healthy diet.  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   Assessment & Plan:   Right hip fracture - No report of fall or injury. Hence etiology of fracture not clear and per his primary Oncologist, does not appear to be pathological fracture. - CT showed acute fracture through the base of the right femoral neck. - Orthopedics was consulted and he underwent closed reduction and percutaneous fixation of femoral neck fracture on 12/13/16. - As per orthopedics, touchdown weightbearing on right leg, Lovenox for DVT prophylaxis 1 month. - May need to consider further workup as outpatient including bone density study. 25-hydroxy vitamin D: 30 (low normal). Continue vitamin D supplements and may need to increase as outpatient. - Initial plans were to go to SNF for rehabilitation. However due to patient having Medicaid, patient would be required to stay at [facility] for 30 days. Mother  concerned that patient would lose his bed at the group home if he stayed in the SNF for this long. Thereby mother has decided that patient should return to the group home where she can provide 24/7 hour care and supervision. Case management has assisted with home health needs and DME. - Pain has improved and patient mobilizing with assistance from his mother.  CML - In remission on Gleevec since 2007. Per oncologist input, Gleevec was continued through this hospitalization.  Autism/Impulse control disorder Continue home medications. Stable.  Essential hypertension Controlled. Continue metoprolol and furosemide.  Hypothyroid Continue Synthroid. Clinically appears euthyroid. TSH is markedly elevated at 31.27, free T4 is 0.78 and free T3 is pending. I discussed with an endocrinologist who recommended increasing the Synthroid from 225 mcg to 300 mcg daily and follow up TSH in 4-6 weeks. However upon discussing in detail with patient's mother, she indicates that the dose of Synthroid was recently reduced a couple of months ago. Thereby will defer change of his Synthroid dose to his outpatient PCP. I advised mother that he should follow-up with his PCP in a week's time. She verbalized understanding.  Postop acute blood loss anemia At baseline patient's hemoglobin probably ranges in the 12 g range. Hemoglobin stable. Follow CBCs as outpatient..  Mild transaminitis - Minimal elevation of AST.? Rhabdomyolysis. CK 1186. CK improved.  Mild sinus tachycardia - Asymptomatic. Likely related to pain and acute blood loss. Improved.  Constipation - Aggressive bowel regimen initiated. Patient had 2 BMs this morning.   Consultants:  Orthopedics   Procedures:  Closed reduction and percutaneous fixation of R femoral neck fracture 12/13/16.   Discharge Instructions  Discharge Instructions  Call MD for:  difficulty breathing, headache or visual disturbances    Complete by:  As directed     Call MD for:  extreme fatigue    Complete by:  As directed    Call MD for:  persistant dizziness or light-headedness    Complete by:  As directed    Call MD for:  redness, tenderness, or signs of infection (pain, swelling, redness, odor or green/yellow discharge around incision site)    Complete by:  As directed    Call MD for:  severe uncontrolled pain    Complete by:  As directed    Call MD for:  temperature >100.4    Complete by:  As directed    Diet - low sodium heart healthy    Complete by:  As directed    Discharge instructions    Complete by:  As directed    Touchdown weightbearing on right lower extremity.   Increase activity slowly    Complete by:  As directed        Medication List    STOP taking these medications   traMADol 50 MG tablet Commonly known as:  ULTRAM     TAKE these medications   cyclobenzaprine 10 MG tablet Commonly known as:  FLEXERIL Take 1 tablet (10 mg total) by mouth 3 (three) times daily as needed for muscle spasms.   diclofenac sodium 1 % Gel Commonly known as:  VOLTAREN Apply topically 4 (four) times daily.   divalproex 500 MG 24 hr tablet Commonly known as:  DEPAKOTE ER Take 2,000 mg by mouth daily.   enoxaparin 60 MG/0.6ML injection Commonly known as:  LOVENOX Inject 0.55 mLs (55 mg total) into the skin daily.   furosemide 20 MG tablet Commonly known as:  LASIX Take 20 mg by mouth daily.   haloperidol 1 MG tablet Commonly known as:  HALDOL Take 1.5 mg by mouth daily.   imatinib 100 MG tablet Commonly known as:  GLEEVEC Take two pills twice daily with meals and large glass of water.Caution:Chemotherapy   LEVOTHROID 300 MCG tablet Generic drug:  levothyroxine Take 225 mcg by mouth daily.   meloxicam 15 MG tablet Commonly known as:  MOBIC Take 15 mg by mouth daily.   metoprolol succinate 50 MG 24 hr tablet Commonly known as:  TOPROL-XL Take 50 mg by mouth daily.   minocycline 100 MG capsule Commonly known as:   MINOCIN,DYNACIN Take 100 mg by mouth 2 (two) times daily.   mupirocin ointment 2 % Commonly known as:  BACTROBAN Apply topically 3 (three) times daily.   nystatin cream Commonly known as:  MYCOSTATIN Apply 1 application topically at bedtime.   oxyCODONE 5 MG immediate release tablet Commonly known as:  Oxy IR/ROXICODONE Take 1-2 tablets (5-10 mg total) by mouth every 6 (six) hours as needed for moderate pain.   polyethylene glycol packet Commonly known as:  MIRALAX / GLYCOLAX Take 17 g by mouth 2 (two) times daily.   QUEtiapine 200 MG 24 hr tablet Commonly known as:  SEROQUEL XR Take 800 mg by mouth at bedtime.   senna 8.6 MG Tabs tablet Commonly known as:  SENOKOT Take 2 tablets (17.2 mg total) by mouth daily as needed for mild constipation or moderate constipation.   Vitamin D3 1000 units Caps Take 1,000 Units by mouth daily.   zonisamide 100 MG capsule Commonly known as:  ZONEGRAN Take 300 mg by mouth daily.      Follow-up Information    Deatra Ina,  Baldemar Friday., PA-C. Schedule an appointment as soon as possible for a visit in 1 week(s).   Specialty:  Family Medicine Why:  To be seen with repeat labs (CBC & BMP). Please evaluate regarding his hypothyroidism and adjustment of medications. Lab work in the hospital for this was abnormal. Contact information: [address] [place] [address] [phone]        Shona Needles, MD. Schedule an appointment as soon as possible for a visit in 3 week(s).   Specialty:  Orthopedic Surgery Contact information: [address] [place] [address] [phone]          No Known Allergies    Procedures/Studies:  Ct Hip Right Wo Contrast  Result Date: 12/12/2016 CLINICAL DATA:  Right hip pain and tenderness to touch. The patient is being treated for leukemia. Unknown history of trauma. Initial encounter. EXAM: CT OF THE RIGHT HIP WITHOUT CONTRAST TECHNIQUE: Multidetector CT imaging of the right hip  was performed according to the standard protocol. Multiplanar CT image reconstructions were also generated. COMPARISON:  Plain films right hip this same day. FINDINGS: Bones/Joint/Cartilage The patient has a mildly comminuted fracture through the base of the right femoral neck. The anterior fracture margins are distracted 1.4 cm. Slight posterior displacement of the intertrochanteric femur is identified. The femoral head is located. No other fracture is seen. No focal bone lesion to suggest pathologic injury is identified. Ligaments Suboptimally assessed by CT. Muscles and Tendons Appear intact. Soft tissues Negative. IMPRESSION: Acute fracture through the base of the right femoral neck without imaging features to suggest pathologic injury. The exam is otherwise negative. Electronically Signed   By: Inge Rise M.D.   On: 12/12/2016 15:47   Dg Chest Port 1 View  Result Date: 12/12/2016 CLINICAL DATA:  Leukemia.  Preoperative evaluation for hip fracture. EXAM: PORTABLE CHEST 1 VIEW COMPARISON:  April 12, 2009 FINDINGS: Lungs are clear. Heart size and pulmonary vascularity are normal. No adenopathy. No bone lesions. IMPRESSION: No evident adenopathy.  No edema or consolidation. Electronically Signed   By: Lowella Grip III M.D.   On: 12/12/2016 18:24   Dg C-arm 61-120 Min  Result Date: 12/13/2016 CLINICAL DATA:  Closed reduction followed by cannulated hip pinning of right hip fracture. EXAM: DG C-ARM 61-120 MIN; OPERATIVE RIGHT HIP WITH PELVIS COMPARISON:  None. FINDINGS: A total of 5 minutes 26 seconds of fluoroscopic time was utilized during placement of 3 cannulated screws across the known basicervical comminuted fracture of the right femur. No joint dislocation. Fine bony detail is limited by the C-arm fluoroscopic technique. Alignment has improved with less varus angulation noted. IMPRESSION: Near-anatomic alignment of right basicervical comminuted fracture traversed by 3 cannulated screws  as seen fluoroscopically. Electronically Signed   By: Ashley Royalty M.D.   On: 12/13/2016 22:19   Dg Hip Operative Unilat W Or W/o Pelvis Right  Result Date: 12/13/2016 CLINICAL DATA:  Closed reduction followed by cannulated hip pinning of right hip fracture. EXAM: DG C-ARM 61-120 MIN; OPERATIVE RIGHT HIP WITH PELVIS COMPARISON:  None. FINDINGS: A total of 5 minutes 26 seconds of fluoroscopic time was utilized during placement of 3 cannulated screws across the known basicervical comminuted fracture of the right femur. No joint dislocation. Fine bony detail is limited by the C-arm fluoroscopic technique. Alignment has improved with less varus angulation noted. IMPRESSION: Near-anatomic alignment of right basicervical comminuted fracture traversed by 3 cannulated screws as seen fluoroscopically. Electronically Signed   By: Shanon Brow  Randel Pigg M.D.   On: 12/13/2016 22:19   Dg Hip Unilat With Pelvis 2-3 Views Left  Result Date: 11/28/2016 CLINICAL DATA:  Left hip pain for the past 5 days.  No known injury. EXAM: DG HIP (WITH OR WITHOUT PELVIS) 2-3V LEFT COMPARISON:  None. FINDINGS: No acute fracture or malalignment. Mild bilateral hip joint space narrowing. Bone mineralization is normal. Soft tissues are unremarkable. IMPRESSION: Mild bilateral hip joint degenerative changes. No acute osseous abnormality. Electronically Signed   By: Titus Dubin M.D.   On: 11/28/2016 10:39   Dg Hip Unilat With Pelvis 2-3 Views Right  Result Date: 12/13/2016 CLINICAL DATA:  Right femoral neck fixation post fracture. EXAM: DG HIP (WITH OR WITHOUT PELVIS) 2-3V RIGHT COMPARISON:  Preoperative radiographs from 12/12/2016. FINDINGS: A comminuted basicervical fracture of the right femur traversed by 3 fixation screws is now noted. Alignment is near anatomic. Joint space narrowing of both hips. Intact pelvis. IMPRESSION: Interval fixation of right femoral neck fracture with 3 screws. Near-anatomic alignment noted. Electronically Signed    By: Ashley Royalty M.D.   On: 12/13/2016 22:23   Dg Hip Unilat  With Pelvis 2-3 Views Right  Result Date: 12/12/2016 CLINICAL DATA:  Pain with uncertain history of trauma. History of leukemia EXAM: DG HIP (WITH OR WITHOUT PELVIS) 2-3V RIGHT COMPARISON:  None. FINDINGS: Frontal pelvis as well as frontal and lateral right hip images were obtained. There is a basicervical region fracture of the right femur with varus angulation of the fracture site. A small fracture fragment is displaced medially. No other fracture is evident. No dislocation. There is slight symmetric narrowing of both hip joints. IMPRESSION: Comminuted basicervical fracture of the proximal right femur with varus angulation at the fracture site. No other fracture. No dislocation. There is mild symmetric narrowing of both hip joints. Electronically Signed   By: Lowella Grip III M.D.   On: 12/12/2016 13:37      Subjective: Patient states that he feels "okay". Poor historian due to his autism. As per mother, progressing well, decreased pain and allowing mobilization with her coaxing. Had 2 BMs this morning. She wishes to take patient back to the group home and has been discussing with them.  Discharge Exam:  Vitals:   12/17/16 1055 12/17/16 1300 12/17/16 2040 12/18/16 0618  BP: 134/83 107/88 139/82 124/82  Pulse: (!) 110 60 94 (!) 101  Resp:   16 16  Temp:  98.3 F (36.8 C) 98.3 F (36.8 C) 98.4 F (36.9 C)  TempSrc:  Oral Oral Oral  SpO2: 99% 98% 100% 100%  Weight:      Height:        General exam: Young male, moderately built and obese, sitting up comfortably in chair this morning. Respiratory system: Clear to auscultation. Respiratory effort normal.  Cardiovascular system: S1 & S2 heard, RRR. No JVD, murmurs, rubs, gallops or clicks. Trace bilateral ankle edema. Gastrointestinal system: Abdomen is nondistended, soft and nontender. No organomegaly or masses felt. Normal bowel sounds heard. Central nervous system:  Alert and oriented to person and place. No focal neurological deficits. Has significant cognitive impairment. Extremities: Symmetric 5 x 5 power. Right hip surgical site clean and dry without acute findings.  Skin: No rashes, lesions or ulcers Psychiatry: Judgement and insight impaired. Mood & affect flat.     The results of significant diagnostics from this hospitalization (including imaging, microbiology, ancillary and laboratory) are listed below for reference.     Microbiology: Recent Results (from the past 240  hour(s))  Surgical PCR screen     Status: None   Collection Time: 12/13/16  9:51 AM  Result Value Ref Range Status   MRSA, PCR NEGATIVE NEGATIVE Final   Staphylococcus aureus NEGATIVE NEGATIVE Final    Comment: (NOTE) The Xpert SA Assay (FDA approved for NASAL specimens in patients 28 years of age and older), is one component of a comprehensive surveillance program. It is not intended to diagnose infection nor to guide or monitor treatment.      Labs: CBC:  Recent Labs Lab 12/12/16 1423 12/13/16 0444 12/14/16 0332 12/15/16 0619 12/16/16 0624 12/18/16 0549  WBC 8.5 7.2 7.7 9.9 8.0 6.4  NEUTROABS 6.1  --   --   --   --   --   HGB 11.4* 10.6* 10.8* 9.4* 9.5* 10.4*  HCT 33.6* 31.4* 32.5* 28.7* 29.3* 30.5*  MCV 99.4 98.7 100.0 101.1* 100.3* 100.0  PLT 190 170 180 187 211 696   Basic Metabolic Panel:  Recent Labs Lab 12/12/16 1423 12/13/16 0444 12/15/16 0619  NA 140 138 137  K 4.2 3.9 3.6  CL 105 104 105  CO2 27 25 25   GLUCOSE 100* 88 170*  BUN 27* 24* 17  CREATININE 1.09 1.17 1.06  CALCIUM 10.1 9.1 8.6*   Liver Function Tests:  Recent Labs Lab 12/12/16 1423 12/13/16 0444 12/15/16 0619  AST 136* 109* 55*  ALT 43 35 36  ALKPHOS 157* 120 123  BILITOT 0.5 0.4 0.4  PROT 7.7 6.1* 6.2*  ALBUMIN 4.3 3.4* 3.2*   Cardiac Enzymes:  Recent Labs Lab 12/15/16 1909  CKTOTAL 1,186*   Thyroid function studies  Recent Labs  12/17/16 1456  TSH  31.270*   Discussed in detail with patient's mother. Updated care and answered questions.    Time coordinating discharge: Over 30 minutes  SIGNED:  Vernell Leep, MD, FACP, [place]. [facility] Pager [phone]  If 7PM-7AM, please contact night-coverage [email or web address] Password [email or web address] 12/18/2016, 2:49 PM

## 2016-12-18 NOTE — Progress Notes (Signed)
Physical Therapy Treatment Patient Details Name: Darrell Russo MRN: [id number] DOB: 1975/05/11 Today's Date: 12/18/2016    History of Present Illness 41 y.o. male with medical history significant of autism, HTN, leukemia presenting with a R hip fracture s/p closed reduction with percutaneous screws    PT Comments    Pt performed stand pivot transfer from commode back to recliner with decreased assistance.  Pt able to stand during session this am and progress through supine therapeutic exercises.  Plan next session for WC mobility.     Follow Up Recommendations  Home health PT;Supervision/Assistance - 24 hour     Equipment Recommendations  Hospital bed;Wheelchair cushion (measurements PT);Wheelchair (measurements PT);3in1 (PT);Rolling walker with 5" wheels    Recommendations for Other Services       Precautions / Restrictions Precautions Precautions: Fall Restrictions Weight Bearing Restrictions: Yes RLE Weight Bearing: Touchdown weight bearing Other Position/Activity Restrictions: Unclear if patient in maintaining TDWB due to cognition.      Mobility  Bed Mobility               General bed mobility comments: Pt sitting on BSC on arrival.    Transfers Overall transfer level: Needs assistance Equipment used: Rolling walker (2 wheeled) Transfers: Sit to/from Stand Sit to Stand: Min assist         General transfer comment: Cues for hand placement to push from [facility] into standing.    Ambulation/Gait Ambulation/Gait assistance: Min guard Ambulation Distance (Feet): 4 Feet Assistive device: Rolling walker (2 wheeled) Gait Pattern/deviations: Step-to pattern;Shuffle;Decreased stride length;Trunk flexed   Gait velocity interpretation: Below normal speed for age/gender General Gait Details: Cues for turning and RW placement for safety.  Pt better to follow commands.  PTA observed limited weight bearing in RLE but appears patient is bearing more than TDWB.      Stairs            Wheelchair Mobility    Modified Rankin (Stroke Patients Only)       Balance Overall balance assessment: Needs assistance   Sitting balance-Leahy Scale: Good       Standing balance-Leahy Scale: Fair                              Cognition Arousal/Alertness: Awake/alert Behavior During Therapy: Flat affect Overall Cognitive Status: History of cognitive impairments - at baseline                                 General Comments: Pt only able to follow short, concise directions - requires simple commands      Exercises Total Joint Exercises Ankle Circles/Pumps: AROM;10 reps;Supine;Right Quad Sets:  (Pt unable to follow commands for isometric exercises.  ) Heel Slides: AAROM;PROM;Right;10 reps;Supine Hip ABduction/ADduction: Right;AAROM;10 reps;Supine Long Arc Quad: AROM;Right;10 reps;Seated (Pt with partial ROM during exercise. )    General Comments        Pertinent Vitals/Pain Pain Assessment: Faces Faces Pain Scale: Hurts little more Pain Location: Pt yelling/screaming out with any movement of RLE; resistive to movement of RLE; suspect partly due to fear of pain with movement vs actual pain  Pain Descriptors / Indicators: Grimacing;Operative site guarding Pain Intervention(s): Monitored during session;Repositioned    Home Living                      Prior Function  PT Goals (current goals can now be found in the care plan section) Acute Rehab PT Goals Patient Stated Goal: return to group home Potential to Achieve Goals: Fair Progress towards PT goals: Progressing toward goals    Frequency    Min 4X/week      PT Plan Current plan remains appropriate    Co-evaluation              AM-PAC PT "6 Clicks" Daily Activity  Outcome Measure  Difficulty turning over in bed (including adjusting bedclothes, sheets and blankets)?: Unable Difficulty moving from lying on back to  sitting on the side of the bed? : Unable Difficulty sitting down on and standing up from a chair with arms (e.g., wheelchair, bedside commode, etc,.)?: A Lot Help needed moving to and from a bed to chair (including a wheelchair)?: A Little Help needed walking in hospital room?: A Little Help needed climbing 3-5 steps with a railing? : Total 6 Click Score: 11    End of Session Equipment Utilized During Treatment: Gait belt Activity Tolerance: Patient tolerated treatment well Patient left: in chair;with call bell/phone within reach;with family/visitor present (mom present in room with patient.  ) Nurse Communication: Mobility status PT Visit Diagnosis: Difficulty in walking, not elsewhere classified (R26.2);Muscle weakness (generalized) (M62.81);Pain;Other abnormalities of gait and mobility (R26.89) Pain - Right/Left: Right Pain - part of body: Leg     Time: 9163-8466 PT Time Calculation (min) (ACUTE ONLY): 27 min  Charges:  $Therapeutic Exercise: 8-22 mins $Therapeutic Activity: 8-22 mins                    G Codes:       Governor Rooks, PTA pager [phone]    Cristela Blue 12/18/2016, 11:23 AM

## 2016-12-18 NOTE — Discharge Instructions (Addendum)
Orthopaedic Trauma Service Discharge Instructions   General Discharge Instructions  WEIGHT BEARING STATUS:Nonweightbearing right leg  RANGE OF MOTION/ACTIVITY: No range of motion limitation  Wound Care: Leave open to air and you may shower  DVT/PE prophylaxis: Lovenox for 30 days  Diet: as you were eating previously.  Can use over the counter stool softeners and bowel preparations, such as Miralax, to help with bowel movements.  Narcotics can be constipating.  Be sure to drink plenty of fluids  PAIN MEDICATION USE AND EXPECTATIONS  You have likely been given narcotic medications to help control your pain.  After a traumatic event that results in an fracture (broken bone) with or without surgery, it is ok to use narcotic pain medications to help control one's pain.  We understand that everyone responds to pain differently and each individual patient will be evaluated on a regular basis for the continued need for narcotic medications. Ideally, narcotic medication use should last no more than 6-8 weeks (coinciding with fracture healing).   As a patient it is your responsibility as well to monitor narcotic medication use and report the amount and frequency you use these medications when you come to your office visit.   We would also advise that if you are using narcotic medications, you should take a dose prior to therapy to maximize you participation.   STOP SMOKING OR USING NICOTINE PRODUCTS!!!!  As discussed nicotine severely impairs your body's ability to heal surgical and traumatic wounds but also impairs bone healing.  Wounds and bone heal by forming microscopic blood vessels (angiogenesis) and nicotine is a vasoconstrictor (essentially, shrinks blood vessels).  Therefore, if vasoconstriction occurs to these microscopic blood vessels they essentially disappear and are unable to deliver necessary nutrients to the healing tissue.  This is one modifiable factor that you can do to dramatically  increase your chances of healing your injury.    (This means no smoking, no nicotine gum, patches, etc)  DO NOT USE NONSTEROIDAL ANTI-INFLAMMATORY DRUGS (NSAID'S)  Using products such as Advil (ibuprofen), Aleve (naproxen), Motrin (ibuprofen) for additional pain control during fracture healing can delay and/or prevent the healing response.  If you would like to take over the counter (OTC) medication, Tylenol (acetaminophen) is ok.  However, some narcotic medications that are given for pain control contain acetaminophen as well. Therefore, you should not exceed more than 4000 mg of tylenol in a day if you do not have liver disease.  Also note that there are may OTC medicines, such as cold medicines and allergy medicines that my contain tylenol as well.  If you have any questions about medications and/or interactions please ask your doctor/PA or your pharmacist.      ICE AND ELEVATE INJURED/OPERATIVE EXTREMITY  Using ice and elevating the injured extremity above your heart can help with swelling and pain control.  Icing in a pulsatile fashion, such as 20 minutes on and 20 minutes off, can be followed.    Do not place ice directly on skin. Make sure there is a barrier between to skin and the ice pack.    Using frozen items such as frozen peas works well as the conform nicely to the are that needs to be iced.  CALL THE OFFICE WITH ANY QUESTIONS OR CONCERNS: [phone]   Additional discharge instructions  Please get your medications reviewed and adjusted by your Primary MD.  Please request your Primary MD to go over all Hospital Tests and Procedure/Radiological results at the follow up, please get all  Hospital records sent to your Prim MD by signing hospital release before you go home.  If you had Pneumonia of Lung problems at the Hospital: Please get a 2 view Chest X ray done in 6-8 weeks after hospital discharge or sooner if instructed by your Primary MD.  If you have Congestive Heart  Failure: Please call your Cardiologist or Primary MD anytime you have any of the following symptoms:  1) 3 pound weight gain in 24 hours or 5 pounds in 1 week  2) shortness of breath, with or without a dry hacking cough  3) swelling in the hands, feet or stomach  4) if you have to sleep on extra pillows at night in order to breathe  Follow cardiac low salt diet and 1.5 lit/day fluid restriction.  If you have diabetes Accuchecks 4 times/day, Once in AM empty stomach and then before each meal. Log in all results and show them to your primary doctor at your next visit. If any glucose reading is under 80 or above 300 call your primary MD immediately.  If you have Seizure/Convulsions/Epilepsy: Please do not drive, operate heavy machinery, participate in activities at heights or participate in high speed sports until you have seen by Primary MD or a Neurologist and advised to do so again.  If you had Gastrointestinal Bleeding: Please ask your Primary MD to check a complete blood count within one week of discharge or at your next visit. Your endoscopic/colonoscopic biopsies that are pending at the time of discharge, will also need to followed by your Primary MD.  Get Medicines reviewed and adjusted. Please take all your medications with you for your next visit with your Primary MD  Please request your Primary MD to go over all hospital tests and procedure/radiological results at the follow up, please ask your Primary MD to get all Hospital records sent to his/her office.  If you experience worsening of your admission symptoms, develop shortness of breath, life threatening emergency, suicidal or homicidal thoughts you must seek medical attention immediately by calling 911 or calling your MD immediately  if symptoms less severe.  You must read complete instructions/literature along with all the possible adverse reactions/side effects for all the Medicines you take and that have been prescribed to  you. Take any new Medicines after you have completely understood and accpet all the possible adverse reactions/side effects.   Do not drive or operate heavy machinery when taking Pain medications.   Do not take more than prescribed Pain, Sleep and Anxiety Medications  Special Instructions: If you have smoked or chewed Tobacco  in the last 2 yrs please stop smoking, stop any regular Alcohol  and or any Recreational drug use.  Wear Seat belts while driving.  Please note You were cared for by a hospitalist during your hospital stay. If you have any questions about your discharge medications or the care you received while you were in the hospital after you are discharged, you can call the unit and asked to speak with the hospitalist on call if the hospitalist that took care of you is not available. Once you are discharged, your primary care physician will handle any further medical issues. Please note that NO REFILLS for any discharge medications will be authorized once you are discharged, as it is imperative that you return to your primary care physician (or establish a relationship with a primary care physician if you do not have one) for your aftercare needs so that they can reassess your need  for medications and monitor your lab values.  You can reach the hospitalist office at phone [phone] or fax [phone]   If you do not have a primary care physician, you can call [phone] for a physician referral.

## 2016-12-19 LAB — T3, FREE: T3 FREE: 1.5 pg/mL — AB (ref 2.0–4.4)

## 2016-12-21 ENCOUNTER — Other Ambulatory Visit: Payer: Medicaid Other

## 2016-12-21 ENCOUNTER — Ambulatory Visit: Payer: Self-pay | Admitting: Oncology

## 2017-05-01 ENCOUNTER — Ambulatory Visit: Payer: Medicaid Other | Admitting: Oncology

## 2017-05-01 ENCOUNTER — Other Ambulatory Visit: Payer: Medicaid Other

## 2017-07-03 ENCOUNTER — Inpatient Hospital Stay: Payer: Medicaid Other | Attending: Oncology

## 2017-07-03 ENCOUNTER — Telehealth: Payer: Self-pay | Admitting: Oncology

## 2017-07-03 ENCOUNTER — Inpatient Hospital Stay (HOSPITAL_BASED_OUTPATIENT_CLINIC_OR_DEPARTMENT_OTHER): Payer: Medicaid Other | Admitting: Oncology

## 2017-07-03 VITALS — BP 123/85 | HR 80 | Resp 17 | Ht 73.0 in | Wt 263.0 lb

## 2017-07-03 DIAGNOSIS — C9210 Chronic myeloid leukemia, BCR/ABL-positive, not having achieved remission: Secondary | ICD-10-CM

## 2017-07-03 DIAGNOSIS — D759 Disease of blood and blood-forming organs, unspecified: Secondary | ICD-10-CM | POA: Diagnosis not present

## 2017-07-03 DIAGNOSIS — C9211 Chronic myeloid leukemia, BCR/ABL-positive, in remission: Secondary | ICD-10-CM | POA: Insufficient documentation

## 2017-07-03 DIAGNOSIS — Z79899 Other long term (current) drug therapy: Secondary | ICD-10-CM | POA: Insufficient documentation

## 2017-07-03 LAB — COMPREHENSIVE METABOLIC PANEL
ALT: 33 U/L (ref 0–55)
ANION GAP: 6 (ref 3–11)
AST: 40 U/L — ABNORMAL HIGH (ref 5–34)
Albumin: 4.1 g/dL (ref 3.5–5.0)
Alkaline Phosphatase: 84 U/L (ref 40–150)
BILIRUBIN TOTAL: 0.4 mg/dL (ref 0.2–1.2)
BUN: 27 mg/dL — ABNORMAL HIGH (ref 7–26)
CALCIUM: 9.6 mg/dL (ref 8.4–10.4)
CO2: 27 mmol/L (ref 22–29)
Chloride: 109 mmol/L (ref 98–109)
Creatinine, Ser: 1.55 mg/dL — ABNORMAL HIGH (ref 0.70–1.30)
GFR, EST NON AFRICAN AMERICAN: 54 mL/min — AB (ref >60)
GLUCOSE: 90 mg/dL (ref 70–140)
POTASSIUM: 3.9 mmol/L (ref 3.5–5.1)
Sodium: 142 mmol/L (ref 136–145)
Total Protein: 7.1 g/dL (ref 6.4–8.3)

## 2017-07-03 LAB — CBC WITH DIFFERENTIAL/PLATELET
BASOS ABS: 0 10*3/uL (ref 0.0–0.1)
BASOS PCT: 1 %
Eosinophils Absolute: 0.1 10*3/uL (ref 0.0–0.5)
Eosinophils Relative: 2 %
HCT: 34.9 % — ABNORMAL LOW (ref 38.4–49.9)
HEMOGLOBIN: 11.6 g/dL — AB (ref 13.0–17.1)
LYMPHS PCT: 49 %
Lymphs Abs: 1.8 10*3/uL (ref 0.9–3.3)
MCH: 34.1 pg — ABNORMAL HIGH (ref 27.2–33.4)
MCHC: 33.2 g/dL (ref 32.0–36.0)
MCV: 102.6 fL — AB (ref 79.3–98.0)
MONOS PCT: 9 %
Monocytes Absolute: 0.3 10*3/uL (ref 0.1–0.9)
NEUTROS ABS: 1.4 10*3/uL — AB (ref 1.5–6.5)
NEUTROS PCT: 39 %
Platelets: 93 10*3/uL — ABNORMAL LOW (ref 140–400)
RBC: 3.4 MIL/uL — ABNORMAL LOW (ref 4.20–5.82)
RDW: 15.3 % — ABNORMAL HIGH (ref 11.0–14.6)
WBC: 3.7 10*3/uL — ABNORMAL LOW (ref 4.0–10.3)

## 2017-07-03 NOTE — Progress Notes (Signed)
Hematology and Oncology Follow Up Visit  Darrell Russo [id number] 1975/08/06 42 y.o. 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   Current therapy: He is on Gleevec 400 mg with complete hematological and molecular remission.  Interim History: Darrell Russo is here for a follow-up visit.  Since the last visit, he underwent an operation in October 2018 for a displaced right femoral fracture.  He went through the operation without any major complications and recovering reasonably well.  He is ambulating without the help of a cane or a walker but with a slight limp.  He has not been running fully at this time.  He continues to participate in a day activity program for people with disability which includes multiple activities during the day that he is able to participate.  He denies any complications related to [place].  He denies any GI complaints or worsening edema.  He denies any difficulty obtaining or taking this medication daily.  He does not report any headaches or double vision or seizures.  He denies any fevers, chills or sweats.  His appetite is excellent without any weight changes. He does not report any chest pain, palpitation orthopnea. Does not report any cough, wheezing or dyspnea on exertion. He does not report any nausea, vomiting or abdominal pain. Does not report any urinary symptoms. He does not report any arthralgias or myalgias.  He does not report any skin rashes or lesions.  He does not report any lymphadenopathy.  Rest of his review of systems is negative.   Medications: I have reviewed the patient's current medications. Current Outpatient Medications  Medication Sig Dispense Refill  . Cholecalciferol (VITAMIN D3) 1000 UNITS CAPS Take 1,000 Units by mouth daily.     . cyclobenzaprine (FLEXERIL) 10 MG tablet Take 1 tablet (10 mg total) by mouth 3 (three) times daily as needed for muscle spasms. 10  tablet 0  . diclofenac sodium (VOLTAREN) 1 % GEL Apply topically 4 (four) times daily.    . divalproex (DEPAKOTE ER) 500 MG 24 hr tablet Take 2,000 mg by mouth daily.     Marland Kitchen enoxaparin (LOVENOX) 60 MG/0.6ML injection Inject 0.55 mLs (55 mg total) into the skin daily. 16.5 mL 0  . furosemide (LASIX) 20 MG tablet Take 20 mg by mouth daily.    . haloperidol (HALDOL) 1 MG tablet Take 1.5 mg by mouth daily.     Marland Kitchen imatinib (GLEEVEC) 100 MG tablet Take two pills twice daily with meals and large glass of water.Caution:Chemotherapy 120 tablet 1  . levothyroxine (LEVOTHROID) 300 MCG tablet Take 225 mcg by mouth daily.     . meloxicam (MOBIC) 15 MG tablet Take 15 mg by mouth daily.    . metoprolol (TOPROL-XL) 50 MG 24 hr tablet Take 50 mg by mouth daily.      . minocycline (MINOCIN,DYNACIN) 100 MG capsule Take 100 mg by mouth 2 (two) times daily.      . mupirocin (BACTROBAN) 2 % ointment Apply topically 3 (three) times daily.      Marland Kitchen nystatin cream (MYCOSTATIN) Apply 1 application topically at bedtime.    Marland Kitchen oxyCODONE (OXY IR/ROXICODONE) 5 MG immediate release tablet Take 1-2 tablets (5-10 mg total) by mouth every 6 (six) hours as needed for moderate pain. 30 tablet 0  . polyethylene glycol (MIRALAX / GLYCOLAX) packet Take 17 g by mouth 2 (two) times daily. 14 each 0  . QUEtiapine (  SEROQUEL XR) 200 MG 24 hr tablet Take 800 mg by mouth at bedtime.     . senna (SENOKOT) 8.6 MG TABS tablet Take 2 tablets (17.2 mg total) by mouth daily as needed for mild constipation or moderate constipation. 30 each 0  . zonisamide (ZONEGRAN) 100 MG capsule Take 300 mg by mouth daily.       No current facility-administered medications for this visit.     Allergies: No Known Allergies  Past Medical History, Surgical history, Social history, and Family History were reviewed and updated.   Physical Exam: Blood pressure 123/85, pulse 80, resp. rate 17, height 6\' 1"  (1.854 m), weight 263 lb (119.3 kg), SpO2 100 %.   ECOG:  1 General appearance: Alert, awake gentleman without distress. Head: Atraumatic without abnormalities. Eyes: No scleral icterus. Oropharynx: Without thrush or ulcers Lymph nodes: No lymphadenopathy noted in the cervical, supraclavicular, and axillary nodes  Heart:regular rate and rhythm, without any murmurs. Lung: Clear to auscultation without any rhonchi, wheezes or dullness to percussion. Abdomin: Soft, nontender without any rebound or guarding. Musculoskeletal: No joint deformity or effusion.   Lab Results: Lab Results  Component Value Date   WBC 6.4 12/18/2016   HGB 10.4 (L) 12/18/2016   HCT 30.5 (L) 12/18/2016   MCV 100.0 12/18/2016   PLT 220 12/18/2016     Chemistry      Component Value Date/Time   NA 137 12/15/2016 0619   NA 141 12/01/2016 0830   K 3.6 12/15/2016 0619   K 4.3 12/01/2016 0830   CL 105 12/15/2016 0619   CL 98 05/28/2012 1453   CO2 25 12/15/2016 0619   CO2 27 12/01/2016 0830   BUN 17 12/15/2016 0619   BUN 26.0 12/01/2016 0830   CREATININE 1.06 12/15/2016 0619   CREATININE 1.4 (H) 12/01/2016 0830      Component Value Date/Time   CALCIUM 8.6 (L) 12/15/2016 0619   CALCIUM 9.9 12/01/2016 0830   ALKPHOS 123 12/15/2016 0619   ALKPHOS 85 12/01/2016 0830   AST 55 (H) 12/15/2016 0619   AST 38 (H) 12/01/2016 0830   ALT 36 12/15/2016 0619   ALT 26 12/01/2016 0830   BILITOT 0.4 12/15/2016 0619   BILITOT 0.35 12/01/2016 0830       Assessment and plan  42 year old gentleman with the following issues:  1.  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  He has been on Gleevec 400 mg daily and achieved complete hematological response and remains with complete molecular remission.  His molecular testing in 2018 showed no detectable BCR ABL transcripts.  The natural course of this disease as well as the risks and benefits of continuing this medication was reviewed today and is agreeable to continue.  He reports no major complications  related to this medication now.   2.  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  No dose adjustments are needed.  3.  Femoral neck fracture in October 2018: He is status post surgical fixation in October 2018.  He appears to be fully recovered.  4.  Follow-up: We will be in 6 months follow his progress.  15  minutes was spent with the patient face-to-face today.  More than 50% of time was dedicated to patient counseling, education and coordination of his care.    Zola Button, MD 5/7/20198:23 AM

## 2017-07-03 NOTE — Telephone Encounter (Signed)
Scheduled appt per 5/7 los -Gave patient AVS and calender per los.  

## 2017-07-06 ENCOUNTER — Encounter: Payer: Self-pay | Admitting: Oncology

## 2017-07-11 LAB — BCR/ABL

## 2017-11-20 ENCOUNTER — Other Ambulatory Visit: Payer: Self-pay | Admitting: *Deleted

## 2018-03-04 ENCOUNTER — Telehealth: Payer: Self-pay | Admitting: Oncology

## 2018-03-04 NOTE — Telephone Encounter (Signed)
Adjusted lab/fu time 1/7 due to lab meeting.  Not able to reach patient at home number. Called cell and spoke with mother re time change from 8 am to 8:30 am.

## 2018-03-05 ENCOUNTER — Inpatient Hospital Stay: Payer: Medicaid Other | Attending: Oncology | Admitting: Oncology

## 2018-03-05 ENCOUNTER — Telehealth: Payer: Self-pay

## 2018-03-05 ENCOUNTER — Inpatient Hospital Stay: Payer: Medicaid Other

## 2018-03-05 VITALS — BP 143/90 | HR 101 | Temp 97.8°F | Resp 18 | Ht 73.0 in | Wt 254.1 lb

## 2018-03-05 DIAGNOSIS — Z791 Long term (current) use of non-steroidal anti-inflammatories (NSAID): Secondary | ICD-10-CM | POA: Diagnosis not present

## 2018-03-05 DIAGNOSIS — Z79899 Other long term (current) drug therapy: Secondary | ICD-10-CM | POA: Diagnosis not present

## 2018-03-05 DIAGNOSIS — C9210 Chronic myeloid leukemia, BCR/ABL-positive, not having achieved remission: Secondary | ICD-10-CM | POA: Diagnosis present

## 2018-03-05 LAB — CBC WITH DIFFERENTIAL (CANCER CENTER ONLY)
Abs Immature Granulocytes: 0 10*3/uL (ref 0.00–0.07)
Basophils Absolute: 0 10*3/uL (ref 0.0–0.1)
Basophils Relative: 0 %
Eosinophils Absolute: 0.1 10*3/uL (ref 0.0–0.5)
Eosinophils Relative: 2 %
HEMATOCRIT: 35 % — AB (ref 39.0–52.0)
HEMOGLOBIN: 11.6 g/dL — AB (ref 13.0–17.0)
IMMATURE GRANULOCYTES: 0 %
LYMPHS ABS: 1.6 10*3/uL (ref 0.7–4.0)
LYMPHS PCT: 49 %
MCH: 33 pg (ref 26.0–34.0)
MCHC: 33.1 g/dL (ref 30.0–36.0)
MCV: 99.4 fL (ref 80.0–100.0)
Monocytes Absolute: 0.3 10*3/uL (ref 0.1–1.0)
Monocytes Relative: 8 %
NEUTROS ABS: 1.4 10*3/uL — AB (ref 1.7–7.7)
NEUTROS PCT: 41 %
NRBC: 0 % (ref 0.0–0.2)
Platelet Count: 123 10*3/uL — ABNORMAL LOW (ref 150–400)
RBC: 3.52 MIL/uL — ABNORMAL LOW (ref 4.22–5.81)
RDW: 14.9 % (ref 11.5–15.5)
WBC Count: 3.4 10*3/uL — ABNORMAL LOW (ref 4.0–10.5)

## 2018-03-05 LAB — CMP (CANCER CENTER ONLY)
ALT: 27 U/L (ref 0–44)
ANION GAP: 9 (ref 5–15)
AST: 29 U/L (ref 15–41)
Albumin: 4 g/dL (ref 3.5–5.0)
Alkaline Phosphatase: 85 U/L (ref 38–126)
BUN: 18 mg/dL (ref 6–20)
CHLORIDE: 111 mmol/L (ref 98–111)
CO2: 22 mmol/L (ref 22–32)
Calcium: 9.3 mg/dL (ref 8.9–10.3)
Creatinine: 1.23 mg/dL (ref 0.61–1.24)
GFR, Estimated: >60 mL/min (ref >60)
Glucose, Bld: 108 mg/dL — ABNORMAL HIGH (ref 70–99)
POTASSIUM: 4.1 mmol/L (ref 3.5–5.1)
SODIUM: 142 mmol/L (ref 135–145)
Total Bilirubin: 0.3 mg/dL (ref 0.3–1.2)
Total Protein: 7 g/dL (ref 6.5–8.1)

## 2018-03-05 NOTE — Progress Notes (Signed)
Hematology and Oncology Follow Up Visit  Darrell Russo [id number] [birth year] 43 y.o. 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  He presented with leukocytosis and found to be in chronic phase.   Current therapy: Gleevec 400 mg started in 2007 and achieved with complete hematological and molecular remission.  Interim History: Darrell Russo returns today for repeat evaluation with his mother.  Since the last visit, he reports no major changes in his health.  He continues to stay in a group home although spends a lot of time with his mother that accompanied him today.  He denies any recent hospitalizations or illnesses.  He continues to tolerate Gleevec without any issues.  He denied any recurrent infections, bleeding or excessive fatigue.  He has been exercising regularly playing basketball of lost weight because of it.  He denies any skin changes or ecchymosis.  He does not report any headaches or double vision or seizures.  Denies any alteration mental status or confusion.  He denies any fevers, chills or sweats. He does not report any chest pain, palpitation orthopnea. Does not report any cough, wheezing. He does not report any nausea, vomiting or distention of the abdomen.  Denies any changes in his bowel habits.  Does not report any frequency urgency or hesitancy.  He does not report any bone pain or pathological fractures.  He does not report any skin rashes or lesions.  He does not report any easy bruising or bleeding.  Rest of his review of systems is negative.   Medications: I have reviewed the patient's current medications. Current Outpatient Medications  Medication Sig Dispense Refill  . Cholecalciferol (VITAMIN D3) 1000 UNITS CAPS Take 1,000 Units by mouth daily.     . cyclobenzaprine (FLEXERIL) 10 MG tablet Take 1 tablet (10 mg total) by mouth 3 (three) times daily as needed for muscle spasms. 10 tablet 0  . diclofenac  sodium (VOLTAREN) 1 % GEL Apply topically 4 (four) times daily.    . divalproex (DEPAKOTE ER) 500 MG 24 hr tablet Take 2,000 mg by mouth daily.     Marland Kitchen enoxaparin (LOVENOX) 60 MG/0.6ML injection Inject 0.55 mLs (55 mg total) into the skin daily. 16.5 mL 0  . furosemide (LASIX) 20 MG tablet Take 20 mg by mouth daily.    . haloperidol (HALDOL) 1 MG tablet Take 1.5 mg by mouth daily.     Marland Kitchen imatinib (GLEEVEC) 100 MG tablet Take two pills twice daily with meals and large glass of water.Caution:Chemotherapy 120 tablet 1  . levothyroxine (LEVOTHROID) 300 MCG tablet Take 225 mcg by mouth daily.     . meloxicam (MOBIC) 15 MG tablet Take 15 mg by mouth daily.    . metoprolol (TOPROL-XL) 50 MG 24 hr tablet Take 50 mg by mouth daily.      . minocycline (MINOCIN,DYNACIN) 100 MG capsule Take 100 mg by mouth 2 (two) times daily.      . mupirocin (BACTROBAN) 2 % ointment Apply topically 3 (three) times daily.      Marland Kitchen nystatin cream (MYCOSTATIN) Apply 1 application topically at bedtime.    . polyethylene glycol (MIRALAX / GLYCOLAX) packet Take 17 g by mouth 2 (two) times daily. 14 each 0  . QUEtiapine (SEROQUEL XR) 200 MG 24 hr tablet Take 800 mg by mouth at bedtime.     . senna (SENOKOT) 8.6 MG TABS tablet Take 2 tablets (17.2 mg total) by mouth  daily as needed for mild constipation or moderate constipation. 30 each 0  . zonisamide (ZONEGRAN) 100 MG capsule Take 300 mg by mouth daily.       No current facility-administered medications for this visit.     Allergies: No Known Allergies  Past Medical History, Surgical history, Social history, and Family History were reviewed and updated.   Physical Exam:  Blood pressure (!) 143/90, pulse (!) 101, temperature 97.8 F (36.6 C), temperature source Oral, resp. rate 18, weight 254 lb 1.6 oz (115.3 kg), SpO2 100 %.   ECOG: 1   General appearance: Comfortable appearing without any discomfort Head: Normocephalic without any trauma Oropharynx: Mucous membranes  are moist and pink without any thrush or ulcers. Eyes: Pupils are equal and round reactive to light. Lymph nodes: No cervical, supraclavicular, inguinal or axillary lymphadenopathy.   Heart:regular rate and rhythm.  S1 and S2 without leg edema. Lung: Clear without any rhonchi or wheezes.  No dullness to percussion. Abdomin: Soft, nontender, nondistended with good bowel sounds.  No hepatosplenomegaly. Musculoskeletal: No joint deformity or effusion.  Full range of motion noted. Neurological: No deficits noted on motor, sensory and deep tendon reflex exam. Skin: No petechial rash or dryness.  Appeared moist.     Lab Results: Lab Results  Component Value Date   WBC 3.7 (L) 07/03/2017   HGB 11.6 (L) 07/03/2017   HCT 34.9 (L) 07/03/2017   MCV 102.6 (H) 07/03/2017   PLT 93 (L) 07/03/2017     Chemistry      Component Value Date/Time   NA 142 07/03/2017 0809   NA 141 12/01/2016 0830   K 3.9 07/03/2017 0809   K 4.3 12/01/2016 0830   CL 109 07/03/2017 0809   CL 98 05/28/2012 1453   CO2 27 07/03/2017 0809   CO2 27 12/01/2016 0830   BUN 27 (H) 07/03/2017 0809   BUN 26.0 12/01/2016 0830   CREATININE 1.55 (H) 07/03/2017 0809   CREATININE 1.4 (H) 12/01/2016 0830      Component Value Date/Time   CALCIUM 9.6 07/03/2017 0809   CALCIUM 9.9 12/01/2016 0830   ALKPHOS 84 07/03/2017 0809   ALKPHOS 85 12/01/2016 0830   AST 40 (H) 07/03/2017 0809   AST 38 (H) 12/01/2016 0830   ALT 33 07/03/2017 0809   ALT 26 12/01/2016 0830   BILITOT 0.4 07/03/2017 0809   BILITOT 0.35 12/01/2016 0830       Assessment and plan  43 year old man with:  1.  Chronic phase CML diagnosed in 2007.  He has been on Gleevec since that time and remains in remission by hematological and molecular criteria.  He is RT-PCR of BCR/ABL obtained in May 2019 showed no detectable disease achieving major molecular response.  He continues to tolerate therapy without any complications.  Risks and benefits of continuing  [place] long-term was discussed.  He is agreeable to proceed.   2.  Cytopenia: His CBC was reviewed today and continues to show stable counts.  His mild cytopenias related to [place] and no adjustment is needed at this time.  3.  Femoral neck fracture in October 2018: No issues reported since that time he is fully recovered.  4.  Follow-up: In 8 months for repeat laboratory testing.  15  minutes was spent with the patient face-to-face today.  More than 50% of time was dedicated to patient counseling, education and coordination of his care.    Zola Button, MD 1/7/20208:47 AM

## 2018-03-05 NOTE — Telephone Encounter (Signed)
Printed avs and calender of upcoming appointment. Per 1/7 los 

## 2018-10-24 ENCOUNTER — Telehealth: Payer: Self-pay

## 2018-10-24 NOTE — Telephone Encounter (Signed)
Received a call from [place], a caregiver in the group home in which the patient resides. Ubaldo Glassing stated that she is concerned that the patient cannot have any visitors with him on his 9/8 appts. She stated that the patient has a cognitive impairment and has difficulty understanding information and following directions and is therefor asking permission to attend the appt with the patient. Obtained permission per Threasa Beards AD that the patient can have his caregiver attend the appointment. Contacted Alexis and made aware that permission for visitor has been granted and a note has been placed in the chart.

## 2018-11-05 ENCOUNTER — Inpatient Hospital Stay: Payer: Medicaid Other | Attending: Oncology | Admitting: Oncology

## 2018-11-05 ENCOUNTER — Other Ambulatory Visit: Payer: Self-pay

## 2018-11-05 ENCOUNTER — Inpatient Hospital Stay: Payer: Medicaid Other

## 2018-11-05 ENCOUNTER — Encounter (INDEPENDENT_AMBULATORY_CARE_PROVIDER_SITE_OTHER): Payer: Self-pay

## 2018-11-05 VITALS — BP 136/80 | HR 88 | Temp 98.7°F | Resp 18 | Ht 73.0 in | Wt 250.1 lb

## 2018-11-05 DIAGNOSIS — Z7901 Long term (current) use of anticoagulants: Secondary | ICD-10-CM | POA: Insufficient documentation

## 2018-11-05 DIAGNOSIS — Z79899 Other long term (current) drug therapy: Secondary | ICD-10-CM | POA: Insufficient documentation

## 2018-11-05 DIAGNOSIS — C9210 Chronic myeloid leukemia, BCR/ABL-positive, not having achieved remission: Secondary | ICD-10-CM | POA: Diagnosis present

## 2018-11-05 DIAGNOSIS — D759 Disease of blood and blood-forming organs, unspecified: Secondary | ICD-10-CM | POA: Diagnosis not present

## 2018-11-05 LAB — CBC WITH DIFFERENTIAL (CANCER CENTER ONLY)
Abs Immature Granulocytes: 0.01 10*3/uL (ref 0.00–0.07)
Basophils Absolute: 0 10*3/uL (ref 0.0–0.1)
Basophils Relative: 0 %
Eosinophils Absolute: 0.1 10*3/uL (ref 0.0–0.5)
Eosinophils Relative: 2 %
HCT: 34.4 % — ABNORMAL LOW (ref 39.0–52.0)
Hemoglobin: 11.6 g/dL — ABNORMAL LOW (ref 13.0–17.0)
Immature Granulocytes: 0 %
Lymphocytes Relative: 43 %
Lymphs Abs: 1.8 10*3/uL (ref 0.7–4.0)
MCH: 33.2 pg (ref 26.0–34.0)
MCHC: 33.7 g/dL (ref 30.0–36.0)
MCV: 98.6 fL (ref 80.0–100.0)
Monocytes Absolute: 0.4 10*3/uL (ref 0.1–1.0)
Monocytes Relative: 10 %
Neutro Abs: 1.8 10*3/uL (ref 1.7–7.7)
Neutrophils Relative %: 45 %
Platelet Count: 107 10*3/uL — ABNORMAL LOW (ref 150–400)
RBC: 3.49 MIL/uL — ABNORMAL LOW (ref 4.22–5.81)
RDW: 14.8 % (ref 11.5–15.5)
WBC Count: 4.1 10*3/uL (ref 4.0–10.5)
nRBC: 0 % (ref 0.0–0.2)

## 2018-11-05 LAB — CMP (CANCER CENTER ONLY)
ALT: 32 U/L (ref 0–44)
AST: 36 U/L (ref 15–41)
Albumin: 4.3 g/dL (ref 3.5–5.0)
Alkaline Phosphatase: 97 U/L (ref 38–126)
Anion gap: 8 (ref 5–15)
BUN: 25 mg/dL — ABNORMAL HIGH (ref 6–20)
CO2: 24 mmol/L (ref 22–32)
Calcium: 9.1 mg/dL (ref 8.9–10.3)
Chloride: 110 mmol/L (ref 98–111)
Creatinine: 1.27 mg/dL — ABNORMAL HIGH (ref 0.61–1.24)
GFR, Est AFR Am: >60 mL/min (ref >60)
GFR, Estimated: >60 mL/min (ref >60)
Glucose, Bld: 102 mg/dL — ABNORMAL HIGH (ref 70–99)
Potassium: 3.9 mmol/L (ref 3.5–5.1)
Sodium: 142 mmol/L (ref 135–145)
Total Bilirubin: 0.3 mg/dL (ref 0.3–1.2)
Total Protein: 7.2 g/dL (ref 6.5–8.1)

## 2018-11-05 NOTE — Progress Notes (Signed)
Hematology and Oncology Follow Up Visit  Darrell Russo [id number] August 19, 1975 43 y.o. 11/05/2018 1:52 PM  CC: [name], MD    Principle Diagnosis: 43 year old man with chronic phase CML diagnosed in 2007 after he presented with leukocytosis.  Positive for molecular testing for BCR/ABL.    Current therapy: Gleevec 400 mg started in 2007.  He remains in hematological and molecular remission.  Interim History: Darrell Russo is here for a follow-up.  Since the last visit, he reports no major changes in his health.  He denies any recent hospitalizations or illnesses.  He continues to reside in a group home without any issues.  His appetite and performance status remains excellent.  He denies any complications related to [place] at this time.  He denies any worsening fatigue, edema or bruising.  Patient denied any alteration mental status, neuropathy, confusion or dizziness.  Denies any headaches or lethargy.  Denies any night sweats, weight loss or changes in appetite.  Denied orthopnea, dyspnea on exertion or chest discomfort.  Denies shortness of breath, difficulty breathing hemoptysis or cough.  Denies any abdominal distention, nausea, early satiety or dyspepsia.  Denies any hematuria, frequency, dysuria or nocturia.  Denies any skin irritation, dryness or rash.  Denies any ecchymosis or petechiae.  Denies any lymphadenopathy or clotting.  Denies any heat or cold intolerance.  Denies any anxiety or depression.  Remaining review of system is negative.        Medications: Updated on review. Current Outpatient Medications  Medication Sig Dispense Refill  . Cholecalciferol (VITAMIN D3) 1000 UNITS CAPS Take 1,000 Units by mouth daily.     . cyclobenzaprine (FLEXERIL) 10 MG tablet Take 1 tablet (10 mg total) by mouth 3 (three) times daily as needed for muscle spasms. 10 tablet 0  . diclofenac sodium (VOLTAREN) 1 % GEL Apply topically 4 (four) times daily.    . divalproex (DEPAKOTE ER) 500 MG 24  hr tablet Take 2,000 mg by mouth daily.     Marland Kitchen enoxaparin (LOVENOX) 60 MG/0.6ML injection Inject 0.55 mLs (55 mg total) into the skin daily. 16.5 mL 0  . furosemide (LASIX) 20 MG tablet Take 20 mg by mouth daily.    . haloperidol (HALDOL) 1 MG tablet Take 1.5 mg by mouth daily.     Marland Kitchen imatinib (GLEEVEC) 100 MG tablet Take two pills twice daily with meals and large glass of water.Caution:Chemotherapy 120 tablet 1  . levothyroxine (LEVOTHROID) 300 MCG tablet Take 225 mcg by mouth daily.     . meloxicam (MOBIC) 15 MG tablet Take 15 mg by mouth daily.    . metoprolol (TOPROL-XL) 50 MG 24 hr tablet Take 50 mg by mouth daily.      . minocycline (MINOCIN,DYNACIN) 100 MG capsule Take 100 mg by mouth 2 (two) times daily.      . mupirocin (BACTROBAN) 2 % ointment Apply topically 3 (three) times daily.      Marland Kitchen nystatin cream (MYCOSTATIN) Apply 1 application topically at bedtime.    . polyethylene glycol (MIRALAX / GLYCOLAX) packet Take 17 g by mouth 2 (two) times daily. 14 each 0  . QUEtiapine (SEROQUEL XR) 200 MG 24 hr tablet Take 800 mg by mouth at bedtime.     . senna (SENOKOT) 8.6 MG TABS tablet Take 2 tablets (17.2 mg total) by mouth daily as needed for mild constipation or moderate constipation. 30 each 0  . zonisamide (ZONEGRAN) 100 MG capsule Take 300 mg by mouth daily.  No current facility-administered medications for this visit.     Allergies: No Known Allergies  Past Medical History, Surgical history, Social history, and Family History unchanged on review.   Physical Exam:  Blood pressure 136/80, pulse 88, temperature 98.7 F (37.1 C), temperature source Temporal, resp. rate 18, height 6\' 1"  (1.854 m), weight 250 lb 1.6 oz (113.4 kg), SpO2 100 %.    ECOG: 1     General appearance: Alert, awake without any distress. Head: Atraumatic without abnormalities Oropharynx: Without any thrush or ulcers. Eyes: No scleral icterus. Lymph nodes: No lymphadenopathy noted in the cervical,  supraclavicular, or axillary nodes Heart:regular rate and rhythm, without any murmurs or gallops.   Lung: Clear to auscultation without any rhonchi, wheezes or dullness to percussion. Abdomin: Soft, nontender without any shifting dullness or ascites. Musculoskeletal: No clubbing or cyanosis. Neurological: No motor or sensory deficits. Skin: No rashes or lesions.     Lab Results: Lab Results  Component Value Date   WBC 3.4 (L) 03/05/2018   HGB 11.6 (L) 03/05/2018   HCT 35.0 (L) 03/05/2018   MCV 99.4 03/05/2018   PLT 123 (L) 03/05/2018     Chemistry      Component Value Date/Time   NA 142 03/05/2018 0839   NA 141 12/01/2016 0830   K 4.1 03/05/2018 0839   K 4.3 12/01/2016 0830   CL 111 03/05/2018 0839   CL 98 05/28/2012 1453   CO2 22 03/05/2018 0839   CO2 27 12/01/2016 0830   BUN 18 03/05/2018 0839   BUN 26.0 12/01/2016 0830   CREATININE 1.23 03/05/2018 0839   CREATININE 1.4 (H) 12/01/2016 0830      Component Value Date/Time   CALCIUM 9.3 03/05/2018 0839   CALCIUM 9.9 12/01/2016 0830   ALKPHOS 85 03/05/2018 0839   ALKPHOS 85 12/01/2016 0830   AST 29 03/05/2018 0839   AST 38 (H) 12/01/2016 0830   ALT 27 03/05/2018 0839   ALT 26 12/01/2016 0830   BILITOT 0.3 03/05/2018 0839   BILITOT 0.35 12/01/2016 0830       Assessment and plan  43 year old man with:  1.  CML presented with a leukocytosis and positive BCR/ABL by PCR testing in 2007.    He continues to be in remission at this time without any evidence of relapsed disease.  Molecular testing obtained in May 2019 continues to show molecular remission without any evidence of relapse by molecular criteria.  The natural course of this disease and risk of relapse was assessed.  [place] treatment options were also reviewed.  At this time he does not require any additional treatment and periodic monitoring at this time is needed.   2.  Cytopenia: CBC reviewed today and showed normal white cell count and slightly  declined platelets but overall counts are stable to his baseline.  No adjustment is needed for his Gleevec.    3.  Follow-up: In 1 year for repeat evaluation.  15  minutes was spent with the patient face-to-face today.  More than 50% of time was spent on updating his disease status, reviewing laboratory data, treatment options and managing future plan of care.    Zola Button, MD 9/8/20201:52 PM

## 2018-11-15 LAB — BCR/ABL

## 2019-03-07 ENCOUNTER — Telehealth: Payer: Self-pay

## 2019-03-07 NOTE — Telephone Encounter (Signed)
Patient's mother Darrell Russo) had called a left a message with the after hours answering service asking if her son should get the COVID vaccine.  Per Dr. Alen Blew, he is ok for his patients to receive the Covid vaccination when it becomes available to them.  Patient's mother called back, given information, and verbalized understanding. Patient's mother stated patient's group home may getting vaccines for the group home members. Patient's mother instructed to call office if she had any other questions or concerns.

## 2019-11-05 ENCOUNTER — Inpatient Hospital Stay: Payer: Medicaid Other

## 2019-11-05 ENCOUNTER — Inpatient Hospital Stay: Payer: Medicaid Other | Attending: Oncology | Admitting: Oncology

## 2019-11-05 ENCOUNTER — Other Ambulatory Visit: Payer: Self-pay

## 2019-11-05 VITALS — BP 138/79 | HR 71 | Temp 97.0°F | Resp 18 | Ht 73.0 in | Wt 246.4 lb

## 2019-11-05 DIAGNOSIS — C9210 Chronic myeloid leukemia, BCR/ABL-positive, not having achieved remission: Secondary | ICD-10-CM | POA: Insufficient documentation

## 2019-11-05 DIAGNOSIS — D759 Disease of blood and blood-forming organs, unspecified: Secondary | ICD-10-CM | POA: Diagnosis not present

## 2019-11-05 DIAGNOSIS — Z7901 Long term (current) use of anticoagulants: Secondary | ICD-10-CM | POA: Diagnosis not present

## 2019-11-05 DIAGNOSIS — Z79899 Other long term (current) drug therapy: Secondary | ICD-10-CM | POA: Insufficient documentation

## 2019-11-05 LAB — CMP (CANCER CENTER ONLY)
ALT: 31 U/L (ref 0–44)
AST: 30 U/L (ref 15–41)
Albumin: 4 g/dL (ref 3.5–5.0)
Alkaline Phosphatase: 75 U/L (ref 38–126)
Anion gap: 6 (ref 5–15)
BUN: 24 mg/dL — ABNORMAL HIGH (ref 6–20)
CO2: 27 mmol/L (ref 22–32)
Calcium: 9.3 mg/dL (ref 8.9–10.3)
Chloride: 106 mmol/L (ref 98–111)
Creatinine: 1.12 mg/dL (ref 0.61–1.24)
GFR, Est AFR Am: >60 mL/min (ref >60)
GFR, Estimated: >60 mL/min (ref >60)
Glucose, Bld: 96 mg/dL (ref 70–99)
Potassium: 3.8 mmol/L (ref 3.5–5.1)
Sodium: 139 mmol/L (ref 135–145)
Total Bilirubin: 0.4 mg/dL (ref 0.3–1.2)
Total Protein: 7.1 g/dL (ref 6.5–8.1)

## 2019-11-05 LAB — CBC WITH DIFFERENTIAL (CANCER CENTER ONLY)
Abs Immature Granulocytes: 0.01 10*3/uL (ref 0.00–0.07)
Basophils Absolute: 0 10*3/uL (ref 0.0–0.1)
Basophils Relative: 0 %
Eosinophils Absolute: 0.4 10*3/uL (ref 0.0–0.5)
Eosinophils Relative: 5 %
HCT: 38.2 % — ABNORMAL LOW (ref 39.0–52.0)
Hemoglobin: 12.9 g/dL — ABNORMAL LOW (ref 13.0–17.0)
Immature Granulocytes: 0 %
Lymphocytes Relative: 31 %
Lymphs Abs: 2.2 10*3/uL (ref 0.7–4.0)
MCH: 33.3 pg (ref 26.0–34.0)
MCHC: 33.8 g/dL (ref 30.0–36.0)
MCV: 98.7 fL (ref 80.0–100.0)
Monocytes Absolute: 0.9 10*3/uL (ref 0.1–1.0)
Monocytes Relative: 12 %
Neutro Abs: 3.7 10*3/uL (ref 1.7–7.7)
Neutrophils Relative %: 52 %
Platelet Count: 175 10*3/uL (ref 150–400)
RBC: 3.87 MIL/uL — ABNORMAL LOW (ref 4.22–5.81)
RDW: 14.8 % (ref 11.5–15.5)
WBC Count: 7.1 10*3/uL (ref 4.0–10.5)
nRBC: 0 % (ref 0.0–0.2)

## 2019-11-05 NOTE — Progress Notes (Signed)
Hematology and Oncology Follow Up Visit  Darrell Russo [id number] [birth year] 44 y.o. 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  He presented with chronic phase and continue to have molecular remission by BCR/ABL criteria.    Current therapy: Gleevec 400 mg daily started in 2007.   Interim History: Darrell Russo returns today for a follow-up visit accompanied by his mother.  Since the last visit, he reports no major changes in his health.  He continues to reside in a group home but his mother visits periodically able to spend time with him.  He has tolerated Gleevec without any complications.  He denies any nausea vomiting or abdominal pain.  He denies any recent hospitalization or illnesses.  He denies any worsening edema or fatigue.       Medications: Unchanged on review. Current Outpatient Medications  Medication Sig Dispense Refill  . Cholecalciferol (VITAMIN D3) 1000 UNITS CAPS Take 1,000 Units by mouth daily.     . cyclobenzaprine (FLEXERIL) 10 MG tablet Take 1 tablet (10 mg total) by mouth 3 (three) times daily as needed for muscle spasms. 10 tablet 0  . diclofenac sodium (VOLTAREN) 1 % GEL Apply topically 4 (four) times daily.    . divalproex (DEPAKOTE ER) 500 MG 24 hr tablet Take 2,000 mg by mouth daily.     Marland Kitchen enoxaparin (LOVENOX) 60 MG/0.6ML injection Inject 0.55 mLs (55 mg total) into the skin daily. 16.5 mL 0  . furosemide (LASIX) 20 MG tablet Take 20 mg by mouth daily.    . haloperidol (HALDOL) 1 MG tablet Take 1.5 mg by mouth daily.     Marland Kitchen imatinib (GLEEVEC) 100 MG tablet Take two pills twice daily with meals and large glass of water.Caution:Chemotherapy 120 tablet 1  . levothyroxine (LEVOTHROID) 300 MCG tablet Take 225 mcg by mouth daily.     . meloxicam (MOBIC) 15 MG tablet Take 15 mg by mouth daily.    . metoprolol (TOPROL-XL) 50 MG 24 hr tablet Take 50 mg by mouth daily.      . minocycline  (MINOCIN,DYNACIN) 100 MG capsule Take 100 mg by mouth 2 (two) times daily.      . mupirocin (BACTROBAN) 2 % ointment Apply topically 3 (three) times daily.      Marland Kitchen nystatin cream (MYCOSTATIN) Apply 1 application topically at bedtime.    . polyethylene glycol (MIRALAX / GLYCOLAX) packet Take 17 g by mouth 2 (two) times daily. 14 each 0  . QUEtiapine (SEROQUEL XR) 200 MG 24 hr tablet Take 800 mg by mouth at bedtime.     . senna (SENOKOT) 8.6 MG TABS tablet Take 2 tablets (17.2 mg total) by mouth daily as needed for mild constipation or moderate constipation. 30 each 0  . zonisamide (ZONEGRAN) 100 MG capsule Take 300 mg by mouth daily.       No current facility-administered medications for this visit.    Allergies: No Known Allergies     Physical Exam:  Blood pressure 138/79, pulse 71, temperature (!) 97 F (36.1 C), temperature source Tympanic, resp. rate 18, height 6\' 1"  (1.854 m), weight 246 lb 6.4 oz (111.8 kg), SpO2 100 %.     ECOG: 1    General appearance: Comfortable appearing without any discomfort Head: Normocephalic without any trauma Oropharynx: Mucous membranes are moist and pink without any thrush or ulcers. Eyes: Pupils are equal and round reactive to light. Lymph nodes: No cervical,  supraclavicular, inguinal or axillary lymphadenopathy.   Heart:regular rate and rhythm.  S1 and S2 without leg edema. Lung: Clear without any rhonchi or wheezes.  No dullness to percussion. Abdomin: Soft, nontender, nondistended with good bowel sounds.  No hepatosplenomegaly. Musculoskeletal: No joint deformity or effusion.  Full range of motion noted. Neurological: No deficits noted on motor, sensory and deep tendon reflex exam. Skin: No petechial rash or dryness.  Appeared moist.       Lab Results: Lab Results  Component Value Date   WBC 4.1 11/05/2018   HGB 11.6 (L) 11/05/2018   HCT 34.4 (L) 11/05/2018   MCV 98.6 11/05/2018   PLT 107 (L) 11/05/2018     Chemistry       Component Value Date/Time   NA 142 11/05/2018 1343   NA 141 12/01/2016 0830   K 3.9 11/05/2018 1343   K 4.3 12/01/2016 0830   CL 110 11/05/2018 1343   CL 98 05/28/2012 1453   CO2 24 11/05/2018 1343   CO2 27 12/01/2016 0830   BUN 25 (H) 11/05/2018 1343   BUN 26.0 12/01/2016 0830   CREATININE 1.27 (H) 11/05/2018 1343   CREATININE 1.4 (H) 12/01/2016 0830      Component Value Date/Time   CALCIUM 9.1 11/05/2018 1343   CALCIUM 9.9 12/01/2016 0830   ALKPHOS 97 11/05/2018 1343   ALKPHOS 85 12/01/2016 0830   AST 36 11/05/2018 1343   AST 38 (H) 12/01/2016 0830   ALT 32 11/05/2018 1343   ALT 26 12/01/2016 0830   BILITOT 0.3 11/05/2018 1343   BILITOT 0.35 12/01/2016 0830       Assessment and plan  44 year old man with:   1.  Chronic phase CML diagnosed in 2007.  He continues to be in hematological and molecular remission since that time.  The natural course of this disease was reviewed today including BCR/ABL PCR which showed major molecular remission without any evidence of disease in September 2020.  He continues to tolerate Gleevec without any complications and risks and benefits of continuing this treatment long-term were discussed.  Given his excellent tolerance and continued remission I recommended continuing his therapy for the time being.  Alternative treatment options including second and third generation molecular targeted therapy will be used if he developed relapsed disease.  Long-term issues associated with Gleevec are well-known at this time and has not experienced any.  These would include worsening edema cytopenia and heart failure.  He is agreeable to continue at this time.   2.  Cytopenia: Related to [place] and has been overall mild without any need for dose adjustment.  CBC from today reviewed and showed normal white cell count and platelet count.  His hemoglobin is close to normal range.     3.  Follow-up: He will follow up in 12 months for repeat  evaluation.  30  minutes were dedicated to this visit. The time was spent on reviewing laboratory data, discussing treatment options, discussing complications related to therapy and answering questions regarding future plan.     Zola Button, MD 9/8/20211:09 PM

## 2019-11-11 LAB — BCR/ABL

## 2019-12-05 ENCOUNTER — Telehealth: Payer: Self-pay

## 2019-12-05 NOTE — Telephone Encounter (Signed)
Returned phone with next appointment date and time.

## 2020-11-03 ENCOUNTER — Inpatient Hospital Stay (HOSPITAL_BASED_OUTPATIENT_CLINIC_OR_DEPARTMENT_OTHER): Payer: Medicare Other | Admitting: Oncology

## 2020-11-03 ENCOUNTER — Other Ambulatory Visit: Payer: Self-pay

## 2020-11-03 ENCOUNTER — Inpatient Hospital Stay: Payer: Medicare Other | Attending: Oncology

## 2020-11-03 VITALS — BP 138/72 | HR 66 | Temp 98.1°F | Resp 17 | Ht 73.0 in | Wt 235.7 lb

## 2020-11-03 DIAGNOSIS — D759 Disease of blood and blood-forming organs, unspecified: Secondary | ICD-10-CM | POA: Diagnosis not present

## 2020-11-03 DIAGNOSIS — R5383 Other fatigue: Secondary | ICD-10-CM | POA: Diagnosis not present

## 2020-11-03 DIAGNOSIS — Z79899 Other long term (current) drug therapy: Secondary | ICD-10-CM | POA: Insufficient documentation

## 2020-11-03 DIAGNOSIS — C9210 Chronic myeloid leukemia, BCR/ABL-positive, not having achieved remission: Secondary | ICD-10-CM | POA: Diagnosis not present

## 2020-11-03 DIAGNOSIS — R609 Edema, unspecified: Secondary | ICD-10-CM | POA: Insufficient documentation

## 2020-11-03 DIAGNOSIS — Z7901 Long term (current) use of anticoagulants: Secondary | ICD-10-CM | POA: Diagnosis not present

## 2020-11-03 DIAGNOSIS — D72829 Elevated white blood cell count, unspecified: Secondary | ICD-10-CM | POA: Diagnosis not present

## 2020-11-03 LAB — CBC WITH DIFFERENTIAL (CANCER CENTER ONLY)
Abs Immature Granulocytes: 0 10*3/uL (ref 0.00–0.07)
Basophils Absolute: 0 10*3/uL (ref 0.0–0.1)
Basophils Relative: 0 %
Eosinophils Absolute: 0.1 10*3/uL (ref 0.0–0.5)
Eosinophils Relative: 2 %
HCT: 37.3 % — ABNORMAL LOW (ref 39.0–52.0)
Hemoglobin: 12.7 g/dL — ABNORMAL LOW (ref 13.0–17.0)
Immature Granulocytes: 0 %
Lymphocytes Relative: 35 %
Lymphs Abs: 1.9 10*3/uL (ref 0.7–4.0)
MCH: 31.4 pg (ref 26.0–34.0)
MCHC: 34 g/dL (ref 30.0–36.0)
MCV: 92.1 fL (ref 80.0–100.0)
Monocytes Absolute: 0.6 10*3/uL (ref 0.1–1.0)
Monocytes Relative: 10 %
Neutro Abs: 2.8 10*3/uL (ref 1.7–7.7)
Neutrophils Relative %: 53 %
Platelet Count: 154 10*3/uL (ref 150–400)
RBC: 4.05 MIL/uL — ABNORMAL LOW (ref 4.22–5.81)
RDW: 14.9 % (ref 11.5–15.5)
WBC Count: 5.4 10*3/uL (ref 4.0–10.5)
nRBC: 0 % (ref 0.0–0.2)

## 2020-11-03 LAB — CMP (CANCER CENTER ONLY)
ALT: 22 U/L (ref 0–44)
AST: 23 U/L (ref 15–41)
Albumin: 4.1 g/dL (ref 3.5–5.0)
Alkaline Phosphatase: 82 U/L (ref 38–126)
Anion gap: 9 (ref 5–15)
BUN: 18 mg/dL (ref 6–20)
CO2: 24 mmol/L (ref 22–32)
Calcium: 9.6 mg/dL (ref 8.9–10.3)
Chloride: 108 mmol/L (ref 98–111)
Creatinine: 1.22 mg/dL (ref 0.61–1.24)
GFR, Estimated: >60 mL/min (ref >60)
Glucose, Bld: 76 mg/dL (ref 70–99)
Potassium: 3.7 mmol/L (ref 3.5–5.1)
Sodium: 141 mmol/L (ref 135–145)
Total Bilirubin: 0.3 mg/dL (ref 0.3–1.2)
Total Protein: 7.2 g/dL (ref 6.5–8.1)

## 2020-11-03 NOTE — Progress Notes (Signed)
Hematology and Oncology Follow Up Visit  Darrell Russo [id number] [birth year] 45 y.o. 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  He presented with leukocytosis and continues to be in molecular remission.  Current therapy: Gleevec 400 mg daily started in 2007.   Interim History: Darrell Russo is here for a follow-up visit with his mother.  Since the last visit, he reports feeling well without any major complaints.  He denies any nausea, vomiting or abdominal pain.  He continues to tolerate Gleevec without any major complaints.  He denies any worsening edema or weight gain.  He had an active summer participating in summer camp support at his group home.  He denies any recent injuries or falls.       Medications: Reviewed without changes. Current Outpatient Medications  Medication Sig Dispense Refill   Cholecalciferol (VITAMIN D3) 1000 UNITS CAPS Take 1,000 Units by mouth daily.      cyclobenzaprine (FLEXERIL) 10 MG tablet Take 1 tablet (10 mg total) by mouth 3 (three) times daily as needed for muscle spasms. 10 tablet 0   diclofenac sodium (VOLTAREN) 1 % GEL Apply topically 4 (four) times daily.     divalproex (DEPAKOTE ER) 500 MG 24 hr tablet Take 2,000 mg by mouth daily.      enoxaparin (LOVENOX) 60 MG/0.6ML injection Inject 0.55 mLs (55 mg total) into the skin daily. 16.5 mL 0   furosemide (LASIX) 20 MG tablet Take 20 mg by mouth daily.     haloperidol (HALDOL) 1 MG tablet Take 1.5 mg by mouth daily.      imatinib (GLEEVEC) 100 MG tablet Take two pills twice daily with meals and large glass of water.Caution:Chemotherapy 120 tablet 1   levothyroxine (LEVOTHROID) 300 MCG tablet Take 225 mcg by mouth daily.      meloxicam (MOBIC) 15 MG tablet Take 15 mg by mouth daily.     metoprolol (TOPROL-XL) 50 MG 24 hr tablet Take 50 mg by mouth daily.       minocycline (MINOCIN,DYNACIN) 100 MG capsule Take 100 mg by  mouth 2 (two) times daily.       mupirocin (BACTROBAN) 2 % ointment Apply topically 3 (three) times daily.       nystatin cream (MYCOSTATIN) Apply 1 application topically at bedtime.     polyethylene glycol (MIRALAX / GLYCOLAX) packet Take 17 g by mouth 2 (two) times daily. 14 each 0   QUEtiapine (SEROQUEL XR) 200 MG 24 hr tablet Take 800 mg by mouth at bedtime.      senna (SENOKOT) 8.6 MG TABS tablet Take 2 tablets (17.2 mg total) by mouth daily as needed for mild constipation or moderate constipation. 30 each 0   zonisamide (ZONEGRAN) 100 MG capsule Take 300 mg by mouth daily.       No current facility-administered medications for this visit.    Allergies: No Known Allergies     Physical Exam:    Blood pressure 138/72, pulse 66, temperature 98.1 F (36.7 C), temperature source Oral, resp. rate 17, height '6\' 1"'$  (1.854 m), weight 235 lb 11.2 oz (106.9 kg), SpO2 100 %.    ECOG: 1     General appearance: Alert, awake without any distress. Head: Atraumatic without abnormalities Oropharynx: Without any thrush or ulcers. Eyes: No scleral icterus. Lymph nodes: No lymphadenopathy noted in the cervical, supraclavicular, or axillary nodes Heart:regular rate and rhythm, without any murmurs or gallops.  Lung: Clear to auscultation without any rhonchi, wheezes or dullness to percussion. Abdomin: Soft, nontender without any shifting dullness or ascites. Musculoskeletal: No clubbing or cyanosis. Neurological: No motor or sensory deficits. Skin: No rashes or lesions.       Lab Results: Lab Results  Component Value Date   WBC 7.1 11/05/2019   HGB 12.9 (L) 11/05/2019   HCT 38.2 (L) 11/05/2019   MCV 98.7 11/05/2019   PLT 175 11/05/2019     Chemistry      Component Value Date/Time   NA 139 11/05/2019 1307   NA 141 12/01/2016 0830   K 3.8 11/05/2019 1307   K 4.3 12/01/2016 0830   CL 106 11/05/2019 1307   CL 98 05/28/2012 1453   CO2 27 11/05/2019 1307   CO2 27 12/01/2016  0830   BUN 24 (H) 11/05/2019 1307   BUN 26.0 12/01/2016 0830   CREATININE 1.12 11/05/2019 1307   CREATININE 1.4 (H) 12/01/2016 0830      Component Value Date/Time   CALCIUM 9.3 11/05/2019 1307   CALCIUM 9.9 12/01/2016 0830   ALKPHOS 75 11/05/2019 1307   ALKPHOS 85 12/01/2016 0830   AST 30 11/05/2019 1307   AST 38 (H) 12/01/2016 0830   ALT 31 11/05/2019 1307   ALT 26 12/01/2016 0830   BILITOT 0.4 11/05/2019 1307   BILITOT 0.35 12/01/2016 0830       Assessment and plan  45 year old man with:   1.  CML presented with leukocytosis 2007.  He was found to have chronic phase with currently in molecular remission.  He had continues to be in hematological as well as molecular remission based on molecular testing in September 2021.  This will continue to be updated annually.  Risks and benefits of continuing [place] were reviewed.  Complications that include fatigue, cytopenias and edema were reiterated.  He and his mother are agreeable to continue.   2.  Cytopenia: Laboratory data from today reviewed and showed normal white cell count and platelets.  His hemoglobin is close to normal range.  No intervention is needed.   3.  Follow-up: In 12 months for repeat follow-up.  30  minutes were spent on this encounter.  The time was dedicated to reviewing laboratory data, disease status update and complications related to his condition and treatment.     Darrell Button, MD 9/7/20229:58 AM

## 2020-11-11 LAB — BCR/ABL

## 2021-04-09 ENCOUNTER — Other Ambulatory Visit: Payer: Self-pay

## 2021-04-09 ENCOUNTER — Emergency Department (HOSPITAL_COMMUNITY)
Admission: EM | Admit: 2021-04-09 | Discharge: 2021-04-09 | Disposition: A | Discharge status: Home / Self Care | Payer: Medicare Other | Attending: Emergency Medicine | Admitting: Emergency Medicine

## 2021-04-09 ENCOUNTER — Encounter (HOSPITAL_COMMUNITY): Payer: Self-pay | Admitting: *Deleted

## 2021-04-09 DIAGNOSIS — Y92009 Unspecified place in unspecified non-institutional (private) residence as the place of occurrence of the external cause: Secondary | ICD-10-CM | POA: Insufficient documentation

## 2021-04-09 DIAGNOSIS — S0101XA Laceration without foreign body of scalp, initial encounter: Secondary | ICD-10-CM | POA: Diagnosis not present

## 2021-04-09 DIAGNOSIS — F840 Autistic disorder: Secondary | ICD-10-CM | POA: Diagnosis not present

## 2021-04-09 DIAGNOSIS — W228XXA Striking against or struck by other objects, initial encounter: Secondary | ICD-10-CM | POA: Insufficient documentation

## 2021-04-09 DIAGNOSIS — S0990XA Unspecified injury of head, initial encounter: Secondary | ICD-10-CM | POA: Diagnosis present

## 2021-04-09 DIAGNOSIS — Z23 Encounter for immunization: Secondary | ICD-10-CM | POA: Insufficient documentation

## 2021-04-09 MED ORDER — LIDOCAINE-EPINEPHRINE-TETRACAINE (LET) TOPICAL GEL
3.0000 mL | Freq: Once | TOPICAL | Status: AC
Start: 2021-04-09 — End: 2021-04-09
  Administered 2021-04-09: 3 mL via TOPICAL
  Filled 2021-04-09: qty 3

## 2021-04-09 MED ORDER — LIDOCAINE-EPINEPHRINE (PF) 2 %-1:200000 IJ SOLN
20.0000 mL | Freq: Once | INTRAMUSCULAR | Status: AC
  Administered 2021-04-09: 20 mL
  Filled 2021-04-09: qty 20

## 2021-04-09 MED ORDER — TETANUS-DIPHTH-ACELL PERTUSSIS 5-2.5-18.5 LF-MCG/0.5 IM SUSY
0.5000 mL | PREFILLED_SYRINGE | Freq: Once | INTRAMUSCULAR | Status: AC
Start: 2021-04-09 — End: 2021-04-09
  Administered 2021-04-09: 0.5 mL via INTRAMUSCULAR
  Filled 2021-04-09: qty 0.5

## 2021-04-09 NOTE — Discharge Instructions (Addendum)
Please use Tylenol or ibuprofen for pain.  You may use 600 mg ibuprofen every 6 hours or 1000 mg of Tylenol every 6 hours.  You may choose to alternate between the 2.  This would be most effective.  Not to exceed 4 g of Tylenol within 24 hours.  Not to exceed 3200 mg ibuprofen 24 hours.  Please return in 7-10 days for removal of the staples.

## 2021-04-09 NOTE — ED Triage Notes (Signed)
Per group home worker, hit his head on a shelf. Laceration to the top of the left head. Unknown last tetanus.

## 2021-04-09 NOTE — ED Provider Triage Note (Signed)
Emergency Medicine Provider Triage Evaluation Note  Darrell Russo , a 46 y.o. male  was evaluated in triage.  Pt complains of head laceration after becoming agitated at group home and headbutting a cabinet. Unsure when last tetanus shot is. Hx of autism. Patient denies additional pain or symptoms at this time.  Review of Systems  Positive: laceration Negative: headache  Physical Exam  There were no vitals taken for this visit. Gen:   Awake, no distress   Resp:  Normal effort  MSK:   Moves extremities without difficulty  Other:  Around 5cm laceration that is clean and linear with no retained foreign body on scalp  Medical Decision Making  Medically screening exam initiated at 8:38 PM.  Appropriate orders placed.  DANY WALTHER was informed that the remainder of the evaluation will be completed by another provider, this initial triage assessment does not replace that evaluation, and the importance of remaining in the ED until their evaluation is complete.  laceration   Anselmo Pickler, [place] 04/09/21 2039

## 2021-04-09 NOTE — ED Provider Notes (Signed)
[facility] DEPT Provider Note   CSN: [id number] Arrival date & time: 04/09/21  2024     History  Chief Complaint  Patient presents with   Head Injury    Darrell Russo is a 46 y.o. male with a past medical history significant for autism, history of leukemia who presents with concern for scalp laceration.  Patient became agitated at group home, head butted a cabinet.  Unsure when last tetanus shot is.  Denies any current blood thinner use, headache, confusion at this time.  Bleeding controlled prior to arrival.   Head Injury     Home Medications Prior to Admission medications   Medication Sig Start Date End Date Taking? Authorizing Provider  Cholecalciferol (VITAMIN D3) 1000 UNITS CAPS Take 1,000 Units by mouth daily.     [provider]  cyclobenzaprine (FLEXERIL) 10 MG tablet Take 1 tablet (10 mg total) by mouth 3 (three) times daily as needed for muscle spasms. 11/28/16   Doristine Devoid, PA-C  diclofenac sodium (VOLTAREN) 1 % GEL Apply topically 4 (four) times daily.    [provider]  divalproex (DEPAKOTE ER) 500 MG 24 hr tablet Take 2,000 mg by mouth daily.     [provider]  enoxaparin (LOVENOX) 60 MG/0.6ML injection Inject 0.55 mLs (55 mg total) into the skin daily. 12/18/16 01/17/17  HaddixThomasene Lot, MD  furosemide (LASIX) 20 MG tablet Take 20 mg by mouth daily.    [provider]  haloperidol (HALDOL) 1 MG tablet Take 1.5 mg by mouth daily.     [provider]  imatinib (GLEEVEC) 100 MG tablet Take two pills twice daily with meals and large glass of water.Caution:Chemotherapy 10/25/15   Wyatt Portela, MD  levothyroxine (LEVOTHROID) 300 MCG tablet Take 225 mcg by mouth daily.     [provider]  meloxicam (MOBIC) 15 MG tablet Take 15 mg by mouth daily.    [provider]  metoprolol (TOPROL-XL) 50 MG 24 hr tablet Take 50 mg by mouth daily.      [provider]   minocycline (MINOCIN,DYNACIN) 100 MG capsule Take 100 mg by mouth 2 (two) times daily.      [provider]  mupirocin (BACTROBAN) 2 % ointment Apply topically 3 (three) times daily.      [provider]  nystatin cream (MYCOSTATIN) Apply 1 application topically at bedtime.    [provider]  polyethylene glycol (MIRALAX / GLYCOLAX) packet Take 17 g by mouth 2 (two) times daily. 12/18/16   Hongalgi, Lenis Dickinson, MD  QUEtiapine (SEROQUEL XR) 200 MG 24 hr tablet Take 800 mg by mouth at bedtime.     [provider]  senna (SENOKOT) 8.6 MG TABS tablet Take 2 tablets (17.2 mg total) by mouth daily as needed for mild constipation or moderate constipation. 12/18/16   Hongalgi, Lenis Dickinson, MD  zonisamide (ZONEGRAN) 100 MG capsule Take 300 mg by mouth daily.      [provider]      Allergies    Patient has no known allergies.    Review of Systems   Review of Systems  Skin:  Positive for wound.  All other systems reviewed and are negative.  Physical Exam Updated Vital Signs BP (!) 150/95 (BP Location: Right Arm)    Pulse 63    Temp 98.3 F (36.8 C) (Oral)    Resp 18    Ht 6\' 1"  (1.854 m)    Wt 111.1  kg    SpO2 97%    BMI 32.32 kg/m  Physical Exam Vitals and nursing note reviewed.  Constitutional:      General: He is not in acute distress.    Appearance: Normal appearance.  HENT:     Head: Normocephalic and atraumatic.  Eyes:     General:        Right eye: No discharge.        Left eye: No discharge.  Cardiovascular:     Rate and Rhythm: Normal rate and regular rhythm.  Pulmonary:     Effort: Pulmonary effort is normal. No respiratory distress.  Musculoskeletal:        General: No deformity.  Skin:    General: Skin is warm and dry.     Comments: Patient with approximately 5 to 6 cm laceration on the left side of the parietal scalp.  Not actively bleeding.  Clean linear laceration.  No evidence of retained foreign body.  Full wound length  visualized.  Neurological:     Mental Status: He is alert. Mental status is at baseline.  Psychiatric:        Mood and Affect: Mood normal.        Behavior: Behavior normal.    ED Results / Procedures / Treatments   Labs (all labs ordered are listed, but only abnormal results are displayed) Labs Reviewed - No data to display  EKG None  Radiology No results found.  Procedures .Marland KitchenLaceration Repair  Date/Time: 04/09/2021 9:52 PM Performed by: Anselmo Pickler, PA-C Authorized by: Anselmo Pickler, PA-C   Consent:    Consent obtained:  Verbal   Consent given by:  Patient and guardian   Risks, benefits, and alternatives were discussed: yes     Risks discussed:  Infection, pain and poor wound healing   Alternatives discussed:  No treatment Universal protocol:    Procedure explained and questions answered to patient or proxy's satisfaction: yes     Patient identity confirmed:  Verbally with patient and arm band Anesthesia:    Anesthesia method:  Topical application and local infiltration   Topical anesthetic:  LET   Local anesthetic:  Lidocaine 2% WITH epi Laceration details:    Location:  Scalp   Scalp location:  L parietal   Length (cm):  6   Depth (mm):  3 Treatment:    Area cleansed with:  Saline   Amount of cleaning:  Standard Skin repair:    Repair method:  Staples   Number of staples:  6 Approximation:    Approximation:  Close Repair type:    Repair type:  Simple Post-procedure details:    Dressing:  Open (no dressing)   Procedure completion:  Tolerated    Medications Ordered in ED Medications  lidocaine-EPINEPHrine-tetracaine (LET) topical gel (3 mLs Topical Given 04/09/21 2054)  lidocaine-EPINEPHrine (XYLOCAINE W/EPI) 2 %-1:200000 (PF) injection 20 mL (20 mLs Infiltration Given 04/09/21 2054)  Tdap (BOOSTRIX) injection 0.5 mL (0.5 mLs Intramuscular Given 04/09/21 2054)    ED Course/ Medical Decision Making/ A&P                            Medical Decision Making Risk Prescription drug management.   Patient with history of autism, leukemia presents with laceration to the scalp after headbutting cabinet.  He is not up-to-date on tetanus.  We updated his tetanus at this time.  He has a clean linear laceration of the left parietal  scalp.  It was repaired as described above in the note.  Bleeding controlled at time of discharge.  Encourage patient to keep wound clean, and monitor for signs of infection.  Encouraged to return in 7 to 10 days for staple removal.  Patient discharged in stable condition, return precautions given. Final Clinical Impression(s) / ED Diagnoses Final diagnoses:  Laceration of scalp, initial encounter    Rx / DC Orders ED Discharge Orders     None         Anselmo Pickler, PA-C 75/83/07 4600    Campbell Stall P, DO 29/84/73 1311

## 2021-11-03 ENCOUNTER — Other Ambulatory Visit: Payer: Self-pay | Admitting: *Deleted

## 2021-11-03 ENCOUNTER — Inpatient Hospital Stay (HOSPITAL_BASED_OUTPATIENT_CLINIC_OR_DEPARTMENT_OTHER): Payer: Medicare Other | Admitting: Oncology

## 2021-11-03 ENCOUNTER — Other Ambulatory Visit: Payer: Self-pay

## 2021-11-03 ENCOUNTER — Inpatient Hospital Stay: Payer: Medicare Other | Attending: Oncology

## 2021-11-03 VITALS — BP 129/75 | HR 62 | Temp 97.8°F | Resp 16 | Ht 73.0 in | Wt 230.3 lb

## 2021-11-03 DIAGNOSIS — Z79899 Other long term (current) drug therapy: Secondary | ICD-10-CM | POA: Insufficient documentation

## 2021-11-03 DIAGNOSIS — Z79890 Hormone replacement therapy (postmenopausal): Secondary | ICD-10-CM | POA: Insufficient documentation

## 2021-11-03 DIAGNOSIS — C9210 Chronic myeloid leukemia, BCR/ABL-positive, not having achieved remission: Secondary | ICD-10-CM

## 2021-11-03 DIAGNOSIS — D72829 Elevated white blood cell count, unspecified: Secondary | ICD-10-CM | POA: Diagnosis present

## 2021-11-03 DIAGNOSIS — D759 Disease of blood and blood-forming organs, unspecified: Secondary | ICD-10-CM | POA: Insufficient documentation

## 2021-11-03 DIAGNOSIS — Z7901 Long term (current) use of anticoagulants: Secondary | ICD-10-CM | POA: Insufficient documentation

## 2021-11-03 LAB — CMP (CANCER CENTER ONLY)
ALT: 31 U/L (ref 0–44)
AST: 33 U/L (ref 15–41)
Albumin: 4.1 g/dL (ref 3.5–5.0)
Alkaline Phosphatase: 77 U/L (ref 38–126)
Anion gap: 3 — ABNORMAL LOW (ref 5–15)
BUN: 26 mg/dL — ABNORMAL HIGH (ref 6–20)
CO2: 28 mmol/L (ref 22–32)
Calcium: 9.5 mg/dL (ref 8.9–10.3)
Chloride: 110 mmol/L (ref 98–111)
Creatinine: 1.45 mg/dL — ABNORMAL HIGH (ref 0.61–1.24)
GFR, Estimated: >60 mL/min (ref >60)
Glucose, Bld: 82 mg/dL (ref 70–99)
Potassium: 4 mmol/L (ref 3.5–5.1)
Sodium: 141 mmol/L (ref 135–145)
Total Bilirubin: 0.4 mg/dL (ref 0.3–1.2)
Total Protein: 6.7 g/dL (ref 6.5–8.1)

## 2021-11-03 LAB — CBC WITH DIFFERENTIAL (CANCER CENTER ONLY)
Abs Immature Granulocytes: 0.01 10*3/uL (ref 0.00–0.07)
Basophils Absolute: 0 10*3/uL (ref 0.0–0.1)
Basophils Relative: 1 %
Eosinophils Absolute: 0.2 10*3/uL (ref 0.0–0.5)
Eosinophils Relative: 3 %
HCT: 35.9 % — ABNORMAL LOW (ref 39.0–52.0)
Hemoglobin: 12.5 g/dL — ABNORMAL LOW (ref 13.0–17.0)
Immature Granulocytes: 0 %
Lymphocytes Relative: 48 %
Lymphs Abs: 2.3 10*3/uL (ref 0.7–4.0)
MCH: 31.3 pg (ref 26.0–34.0)
MCHC: 34.8 g/dL (ref 30.0–36.0)
MCV: 90 fL (ref 80.0–100.0)
Monocytes Absolute: 0.5 10*3/uL (ref 0.1–1.0)
Monocytes Relative: 10 %
Neutro Abs: 1.8 10*3/uL (ref 1.7–7.7)
Neutrophils Relative %: 38 %
Platelet Count: 146 10*3/uL — ABNORMAL LOW (ref 150–400)
RBC: 3.99 MIL/uL — ABNORMAL LOW (ref 4.22–5.81)
RDW: 15.3 % (ref 11.5–15.5)
WBC Count: 4.7 10*3/uL (ref 4.0–10.5)
nRBC: 0 % (ref 0.0–0.2)

## 2021-11-03 NOTE — Progress Notes (Signed)
Hematology and Oncology Follow Up Visit  Darrell Russo [id number] 1975/07/05 46 y.o. 11/03/2021 10:14 AM  CC: [name], MD    Principle Diagnosis: 46 year old man with CML presented with leukocytosis diagnosed in 2007.  He was found to have chronic phase at that time.  Current therapy: Gleevec 400 mg daily started in 2007.   Interim History: Darrell Russo  returns today for a follow-up.  Since last visit, he reports no major changes in his health.  He is accompanied by his mother today who is his guardian also he resides and in a group home for the majority of the time.  He remains active participating in physical activity including basketball and swimming.  He has tolerated Gleevac without any complaints.  He denies any nausea, vomiting or any hospitalizations.      Medications: Updated on review. Current Outpatient Medications  Medication Sig Dispense Refill   Cholecalciferol (VITAMIN D3) 1000 UNITS CAPS Take 1,000 Units by mouth daily.      cyclobenzaprine (FLEXERIL) 10 MG tablet Take 1 tablet (10 mg total) by mouth 3 (three) times daily as needed for muscle spasms. 10 tablet 0   diclofenac sodium (VOLTAREN) 1 % GEL Apply topically 4 (four) times daily.     divalproex (DEPAKOTE ER) 500 MG 24 hr tablet Take 2,000 mg by mouth daily.      enoxaparin (LOVENOX) 60 MG/0.6ML injection Inject 0.55 mLs (55 mg total) into the skin daily. 16.5 mL 0   furosemide (LASIX) 20 MG tablet Take 20 mg by mouth daily.     haloperidol (HALDOL) 1 MG tablet Take 1.5 mg by mouth daily.      imatinib (GLEEVEC) 100 MG tablet Take two pills twice daily with meals and large glass of water.Caution:Chemotherapy 120 tablet 1   levothyroxine (LEVOTHROID) 300 MCG tablet Take 225 mcg by mouth daily.      meloxicam (MOBIC) 15 MG tablet Take 15 mg by mouth daily.     metoprolol (TOPROL-XL) 50 MG 24 hr tablet Take 50 mg by mouth daily.       minocycline (MINOCIN,DYNACIN) 100 MG capsule Take 100 mg by mouth 2 (two)  times daily.       mupirocin (BACTROBAN) 2 % ointment Apply topically 3 (three) times daily.       nystatin cream (MYCOSTATIN) Apply 1 application topically at bedtime.     polyethylene glycol (MIRALAX / GLYCOLAX) packet Take 17 g by mouth 2 (two) times daily. 14 each 0   QUEtiapine (SEROQUEL XR) 200 MG 24 hr tablet Take 800 mg by mouth at bedtime.      senna (SENOKOT) 8.6 MG TABS tablet Take 2 tablets (17.2 mg total) by mouth daily as needed for mild constipation or moderate constipation. 30 each 0   zonisamide (ZONEGRAN) 100 MG capsule Take 300 mg by mouth daily.       No current facility-administered medications for this visit.    Allergies: No Known Allergies     Physical Exam:     Blood pressure 129/75, pulse 62, temperature 97.8 F (36.6 C), temperature source Temporal, resp. rate 16, height '6\' 1"'$  (1.854 m), weight 230 lb 4.8 oz (104.5 kg), SpO2 100 %.    ECOG: 1    General appearance: Comfortable appearing without any discomfort Head: Normocephalic without any trauma Oropharynx: Mucous membranes are moist and pink without any thrush or ulcers. Eyes: Pupils are equal and round reactive to light. Lymph nodes: No cervical, supraclavicular, inguinal or axillary lymphadenopathy.  Heart:regular rate and rhythm.  S1 and S2 without leg edema. Lung: Clear without any rhonchi or wheezes.  No dullness to percussion. Abdomin: Soft, nontender, nondistended with good bowel sounds.  No hepatosplenomegaly. Musculoskeletal: No joint deformity or effusion.  Full range of motion noted. Neurological: No deficits noted on motor, sensory and deep tendon reflex exam. Skin: No petechial rash or dryness.  Appeared moist.        Lab Results: Lab Results  Component Value Date   WBC 4.7 11/03/2021   HGB 12.5 (L) 11/03/2021   HCT 35.9 (L) 11/03/2021   MCV 90.0 11/03/2021   PLT 146 (L) 11/03/2021     Chemistry      Component Value Date/Time   NA 141 11/03/2020 1006   NA 141  12/01/2016 0830   K 3.7 11/03/2020 1006   K 4.3 12/01/2016 0830   CL 108 11/03/2020 1006   CL 98 05/28/2012 1453   CO2 24 11/03/2020 1006   CO2 27 12/01/2016 0830   BUN 18 11/03/2020 1006   BUN 26.0 12/01/2016 0830   CREATININE 1.22 11/03/2020 1006   CREATININE 1.4 (H) 12/01/2016 0830      Component Value Date/Time   CALCIUM 9.6 11/03/2020 1006   CALCIUM 9.9 12/01/2016 0830   ALKPHOS 82 11/03/2020 1006   ALKPHOS 85 12/01/2016 0830   AST 23 11/03/2020 1006   AST 38 (H) 12/01/2016 0830   ALT 22 11/03/2020 1006   ALT 26 12/01/2016 0830   BILITOT 0.3 11/03/2020 1006   BILITOT 0.35 12/01/2016 0830       Assessment and plan  46 year old man with:   1.  Chronic phase CML diagnosed in 2007.  He achieved hematological and molecular remission on Gleevec.   The natural course of his disease was reviewed at this time and treatment choices were discussed.  He continues to be on molecular remission as well as hematological remission on Gleevec.  His molecular testing in September 2020 to confirm that.  This will be repeated today.  Complication associated with Gleevec were discussed today including GI toxicity, edema and myelosuppression.  Given his excellent tolerance and excellent molecular response I recommended continuing this medication.  Different TKI can be used in the future if he has progression of disease.   2.  Cytopenia: His CBC is close to normal range at this time.   3.  Follow-up: In 12 months for a follow-up.  30  minutes were dedicated to this visit.  The time was spent on reviewing laboratory data, disease status update and outlining future plan of care discussion.     Zola Button, MD 9/7/202310:14 AM

## 2021-11-09 LAB — BCR/ABL

## 2022-08-16 ENCOUNTER — Encounter (HOSPITAL_COMMUNITY): Payer: Self-pay

## 2022-08-16 ENCOUNTER — Observation Stay (HOSPITAL_COMMUNITY)
Admission: EM | Admit: 2022-08-16 | Discharge: 2022-08-17 | Disposition: A | Discharge status: Home / Self Care | Payer: Medicare Other | Attending: Internal Medicine | Admitting: Internal Medicine

## 2022-08-16 ENCOUNTER — Observation Stay (HOSPITAL_COMMUNITY): Payer: Medicare Other

## 2022-08-16 ENCOUNTER — Emergency Department (HOSPITAL_COMMUNITY): Payer: Medicare Other

## 2022-08-16 ENCOUNTER — Other Ambulatory Visit: Payer: Self-pay

## 2022-08-16 DIAGNOSIS — E872 Acidosis, unspecified: Secondary | ICD-10-CM | POA: Diagnosis not present

## 2022-08-16 DIAGNOSIS — T68XXXA Hypothermia, initial encounter: Secondary | ICD-10-CM

## 2022-08-16 DIAGNOSIS — J9811 Atelectasis: Secondary | ICD-10-CM | POA: Insufficient documentation

## 2022-08-16 DIAGNOSIS — M879 Osteonecrosis, unspecified: Secondary | ICD-10-CM | POA: Insufficient documentation

## 2022-08-16 DIAGNOSIS — N200 Calculus of kidney: Secondary | ICD-10-CM | POA: Insufficient documentation

## 2022-08-16 DIAGNOSIS — Z79899 Other long term (current) drug therapy: Secondary | ICD-10-CM | POA: Insufficient documentation

## 2022-08-16 DIAGNOSIS — Z1152 Encounter for screening for COVID-19: Secondary | ICD-10-CM | POA: Insufficient documentation

## 2022-08-16 DIAGNOSIS — F0630 Mood disorder due to known physiological condition, unspecified: Secondary | ICD-10-CM

## 2022-08-16 DIAGNOSIS — E039 Hypothyroidism, unspecified: Secondary | ICD-10-CM | POA: Diagnosis not present

## 2022-08-16 DIAGNOSIS — C9210 Chronic myeloid leukemia, BCR/ABL-positive, not having achieved remission: Secondary | ICD-10-CM

## 2022-08-16 DIAGNOSIS — G9340 Encephalopathy, unspecified: Principal | ICD-10-CM

## 2022-08-16 DIAGNOSIS — F840 Autistic disorder: Secondary | ICD-10-CM | POA: Diagnosis present

## 2022-08-16 DIAGNOSIS — R569 Unspecified convulsions: Secondary | ICD-10-CM

## 2022-08-16 DIAGNOSIS — W938XXA Exposure to other excessive cold of man-made origin, initial encounter: Secondary | ICD-10-CM | POA: Insufficient documentation

## 2022-08-16 DIAGNOSIS — I10 Essential (primary) hypertension: Secondary | ICD-10-CM | POA: Insufficient documentation

## 2022-08-16 DIAGNOSIS — R2689 Other abnormalities of gait and mobility: Secondary | ICD-10-CM | POA: Diagnosis not present

## 2022-08-16 DIAGNOSIS — R4182 Altered mental status, unspecified: Secondary | ICD-10-CM | POA: Diagnosis present

## 2022-08-16 DIAGNOSIS — C9590 Leukemia, unspecified not having achieved remission: Secondary | ICD-10-CM | POA: Diagnosis present

## 2022-08-16 LAB — I-STAT CHEM 8, ED
BUN: 24 mg/dL — ABNORMAL HIGH (ref 6–20)
Calcium, Ion: 1.31 mmol/L (ref 1.15–1.40)
Chloride: 108 mmol/L (ref 98–111)
Creatinine, Ser: 1 mg/dL (ref 0.61–1.24)
Glucose, Bld: 184 mg/dL — ABNORMAL HIGH (ref 70–99)
HCT: 39 % (ref 39.0–52.0)
Hemoglobin: 13.3 g/dL (ref 13.0–17.0)
Potassium: 3.6 mmol/L (ref 3.5–5.1)
Sodium: 142 mmol/L (ref 135–145)
TCO2: 22 mmol/L (ref 22–32)

## 2022-08-16 LAB — I-STAT VENOUS BLOOD GAS, ED
Acid-base deficit: 8 mmol/L — ABNORMAL HIGH (ref 0.0–2.0)
Acid-base deficit: 6 mmol/L — ABNORMAL HIGH (ref 0.0–2.0)
Bicarbonate: 19.7 mmol/L — ABNORMAL LOW (ref 20.0–28.0)
Bicarbonate: 20.9 mmol/L (ref 20.0–28.0)
Calcium, Ion: 1.26 mmol/L (ref 1.15–1.40)
Calcium, Ion: 1.27 mmol/L (ref 1.15–1.40)
HCT: 40 % (ref 39.0–52.0)
HCT: 33 % — ABNORMAL LOW (ref 39.0–52.0)
Hemoglobin: 13.6 g/dL (ref 13.0–17.0)
Hemoglobin: 11.2 g/dL — ABNORMAL LOW (ref 13.0–17.0)
O2 Saturation: 89 %
O2 Saturation: 98 %
Potassium: 3.6 mmol/L (ref 3.5–5.1)
Potassium: 4.1 mmol/L (ref 3.5–5.1)
Sodium: 141 mmol/L (ref 135–145)
Sodium: 142 mmol/L (ref 135–145)
TCO2: 21 mmol/L — ABNORMAL LOW (ref 22–32)
TCO2: 22 mmol/L (ref 22–32)
pCO2, Ven: 49.2 mmHg (ref 44–60)
pCO2, Ven: 45.8 mmHg (ref 44–60)
pH, Ven: 7.211 — ABNORMAL LOW (ref 7.25–7.43)
pH, Ven: 7.268 (ref 7.25–7.43)
pO2, Ven: 68 mmHg — ABNORMAL HIGH (ref 32–45)
pO2, Ven: 116 mmHg — ABNORMAL HIGH (ref 32–45)

## 2022-08-16 LAB — COMPREHENSIVE METABOLIC PANEL
ALT: 31 U/L (ref 0–44)
AST: 37 U/L (ref 15–41)
Albumin: 3.6 g/dL (ref 3.5–5.0)
Alkaline Phosphatase: 56 U/L (ref 38–126)
Anion gap: 12 (ref 5–15)
BUN: 23 mg/dL — ABNORMAL HIGH (ref 6–20)
CO2: 20 mmol/L — ABNORMAL LOW (ref 22–32)
Calcium: 9.1 mg/dL (ref 8.9–10.3)
Chloride: 107 mmol/L (ref 98–111)
Creatinine, Ser: 1.15 mg/dL (ref 0.61–1.24)
GFR, Estimated: >60 mL/min (ref >60)
Glucose, Bld: 187 mg/dL — ABNORMAL HIGH (ref 70–99)
Potassium: 3.5 mmol/L (ref 3.5–5.1)
Sodium: 139 mmol/L (ref 135–145)
Total Bilirubin: 0.3 mg/dL (ref 0.3–1.2)
Total Protein: 6.4 g/dL — ABNORMAL LOW (ref 6.5–8.1)

## 2022-08-16 LAB — URINALYSIS, W/ REFLEX TO CULTURE (INFECTION SUSPECTED)
Bacteria, UA: NONE SEEN
Bilirubin Urine: NEGATIVE
Glucose, UA: NEGATIVE mg/dL
Hgb urine dipstick: NEGATIVE
Ketones, ur: 5 mg/dL — AB
Leukocytes,Ua: NEGATIVE
Nitrite: NEGATIVE
Protein, ur: NEGATIVE mg/dL
Specific Gravity, Urine: 1.015 (ref 1.005–1.030)
pH: 5 (ref 5.0–8.0)

## 2022-08-16 LAB — RESP PANEL BY RT-PCR (RSV, FLU A&B, COVID)  RVPGX2
Influenza A by PCR: NEGATIVE
Influenza B by PCR: NEGATIVE
Resp Syncytial Virus by PCR: NEGATIVE
SARS Coronavirus 2 by RT PCR: NEGATIVE

## 2022-08-16 LAB — CBC WITH DIFFERENTIAL/PLATELET
Abs Immature Granulocytes: 0.01 10*3/uL (ref 0.00–0.07)
Basophils Absolute: 0 10*3/uL (ref 0.0–0.1)
Basophils Relative: 0 %
Eosinophils Absolute: 0 10*3/uL (ref 0.0–0.5)
Eosinophils Relative: 0 %
HCT: 36.5 % — ABNORMAL LOW (ref 39.0–52.0)
Hemoglobin: 11.9 g/dL — ABNORMAL LOW (ref 13.0–17.0)
Immature Granulocytes: 0 %
Lymphocytes Relative: 22 %
Lymphs Abs: 1 10*3/uL (ref 0.7–4.0)
MCH: 31.8 pg (ref 26.0–34.0)
MCHC: 32.6 g/dL (ref 30.0–36.0)
MCV: 97.6 fL (ref 80.0–100.0)
Monocytes Absolute: 0.2 10*3/uL (ref 0.1–1.0)
Monocytes Relative: 5 %
Neutro Abs: 3.5 10*3/uL (ref 1.7–7.7)
Neutrophils Relative %: 73 %
Platelets: 105 10*3/uL — ABNORMAL LOW (ref 150–400)
RBC: 3.74 MIL/uL — ABNORMAL LOW (ref 4.22–5.81)
RDW: 16.5 % — ABNORMAL HIGH (ref 11.5–15.5)
WBC: 4.7 10*3/uL (ref 4.0–10.5)
nRBC: 0 % (ref 0.0–0.2)

## 2022-08-16 LAB — LACTIC ACID, PLASMA
Lactic Acid, Venous: 5.5 mmol/L (ref 0.5–1.9)
Lactic Acid, Venous: 3.7 mmol/L (ref 0.5–1.9)
Lactic Acid, Venous: 1.5 mmol/L (ref 0.5–1.9)
Lactic Acid, Venous: 1.6 mmol/L (ref 0.5–1.9)

## 2022-08-16 LAB — TROPONIN I (HIGH SENSITIVITY)
Troponin I (High Sensitivity): 5 ng/L (ref <18)
Troponin I (High Sensitivity): 11 ng/L (ref <18)

## 2022-08-16 LAB — CBG MONITORING, ED: Glucose-Capillary: 176 mg/dL — ABNORMAL HIGH (ref 70–99)

## 2022-08-16 LAB — PROTIME-INR
INR: 1.2 (ref 0.8–1.2)
Prothrombin Time: 15.9 seconds — ABNORMAL HIGH (ref 11.4–15.2)

## 2022-08-16 LAB — TSH: TSH: 1.17 u[IU]/mL (ref 0.350–4.500)

## 2022-08-16 LAB — RAPID URINE DRUG SCREEN, HOSP PERFORMED
Amphetamines: NOT DETECTED
Barbiturates: NOT DETECTED
Benzodiazepines: NOT DETECTED
Cocaine: NOT DETECTED
Opiates: NOT DETECTED
Tetrahydrocannabinol: NOT DETECTED

## 2022-08-16 LAB — CK: Total CK: 263 U/L (ref 49–397)

## 2022-08-16 LAB — HIV ANTIBODY (ROUTINE TESTING W REFLEX): HIV Screen 4th Generation wRfx: NONREACTIVE

## 2022-08-16 LAB — ACETAMINOPHEN LEVEL: Acetaminophen (Tylenol), Serum: <10 ug/mL — ABNORMAL LOW (ref 10–30)

## 2022-08-16 LAB — VALPROIC ACID LEVEL: Valproic Acid Lvl: 53 ug/mL (ref 50.0–100.0)

## 2022-08-16 LAB — SALICYLATE LEVEL: Salicylate Lvl: <7 mg/dL — ABNORMAL LOW (ref 7.0–30.0)

## 2022-08-16 LAB — ETHANOL: Alcohol, Ethyl (B): <10 mg/dL (ref <10)

## 2022-08-16 LAB — MAGNESIUM: Magnesium: 2 mg/dL (ref 1.7–2.4)

## 2022-08-16 LAB — T4, FREE: Free T4: 1.07 ng/dL (ref 0.61–1.12)

## 2022-08-16 LAB — AMMONIA: Ammonia: 27 umol/L (ref 9–35)

## 2022-08-16 MED ORDER — ENOXAPARIN SODIUM 40 MG/0.4ML IJ SOSY
40.0000 mg | PREFILLED_SYRINGE | INTRAMUSCULAR | Status: DC
  Administered 2022-08-16 – 2022-08-17 (×2): 40 mg via SUBCUTANEOUS
  Filled 2022-08-16 (×2): qty 0.4

## 2022-08-16 MED ORDER — ACETAMINOPHEN 650 MG RE SUPP: 650.0000 mg | Freq: Four times a day (QID) | RECTAL | Status: DC | PRN

## 2022-08-16 MED ORDER — METRONIDAZOLE 500 MG/100ML IV SOLN
500.0000 mg | Freq: Once | INTRAVENOUS | Status: AC
  Administered 2022-08-16: 500 mg via INTRAVENOUS
  Filled 2022-08-16: qty 100

## 2022-08-16 MED ORDER — SODIUM CHLORIDE 0.9 % IV SOLN
2.0000 g | INTRAVENOUS | Status: DC
  Administered 2022-08-17: 2 g via INTRAVENOUS
  Filled 2022-08-16: qty 20

## 2022-08-16 MED ORDER — SODIUM CHLORIDE 0.9 % IV BOLUS
1000.0000 mL | Freq: Once | INTRAVENOUS | Status: AC
  Administered 2022-08-16: 1000 mL via INTRAVENOUS

## 2022-08-16 MED ORDER — SODIUM CHLORIDE 0.9 % IV SOLN: 2.0000 g | Freq: Three times a day (TID) | INTRAVENOUS | Status: DC

## 2022-08-16 MED ORDER — MIDAZOLAM HCL 2 MG/2ML IJ SOLN: 0.5000 mg | INTRAMUSCULAR | Status: DC | PRN

## 2022-08-16 MED ORDER — SODIUM CHLORIDE 0.9 % IV SOLN
2.0000 g | Freq: Once | INTRAVENOUS | Status: AC
  Administered 2022-08-16: 2 g via INTRAVENOUS
  Filled 2022-08-16: qty 12.5

## 2022-08-16 MED ORDER — SODIUM CHLORIDE 0.9 % IV BOLUS (SEPSIS)
1000.0000 mL | Freq: Once | INTRAVENOUS | Status: AC
  Administered 2022-08-16: 1000 mL via INTRAVENOUS

## 2022-08-16 MED ORDER — VANCOMYCIN HCL 2000 MG/400ML IV SOLN
2000.0000 mg | Freq: Once | INTRAVENOUS | Status: DC
  Filled 2022-08-16: qty 400

## 2022-08-16 MED ORDER — POLYETHYLENE GLYCOL 3350 17 G PO PACK: 17.0000 g | PACK | Freq: Every day | ORAL | Status: DC | PRN

## 2022-08-16 MED ORDER — GADOBUTROL 1 MMOL/ML IV SOLN
10.0000 mL | Freq: Once | INTRAVENOUS | Status: AC | PRN
  Administered 2022-08-16: 10 mL via INTRAVENOUS

## 2022-08-16 MED ORDER — SODIUM CHLORIDE 0.9 % IV SOLN
75.0000 mg | Freq: Once | INTRAVENOUS | Status: AC | PRN
  Administered 2022-08-17: 75 mg via INTRAVENOUS
  Filled 2022-08-16: qty 7.5

## 2022-08-16 MED ORDER — ACETAMINOPHEN 325 MG PO TABS: 650.0000 mg | ORAL_TABLET | Freq: Four times a day (QID) | ORAL | Status: DC | PRN

## 2022-08-16 MED ORDER — MIDAZOLAM HCL (PF) 10 MG/2ML IJ SOLN: 1.0000 mg | Freq: Once | INTRAMUSCULAR | Status: DC

## 2022-08-16 MED ORDER — LACTATED RINGERS IV BOLUS
1000.0000 mL | Freq: Once | INTRAVENOUS | Status: AC
  Administered 2022-08-16: 1000 mL via INTRAVENOUS

## 2022-08-16 MED ORDER — SODIUM CHLORIDE 0.9 % IV SOLN: 2.0000 g | INTRAVENOUS | Status: DC

## 2022-08-16 MED ORDER — VANCOMYCIN HCL IN DEXTROSE 1-5 GM/200ML-% IV SOLN: 1000.0000 mg | Freq: Once | INTRAVENOUS | Status: DC

## 2022-08-16 MED ORDER — VANCOMYCIN HCL 1500 MG/300ML IV SOLN
1500.0000 mg | Freq: Two times a day (BID) | INTRAVENOUS | Status: DC
  Administered 2022-08-17: 1500 mg via INTRAVENOUS
  Filled 2022-08-16 (×3): qty 300

## 2022-08-16 MED ORDER — VALPROATE SODIUM 100 MG/ML IV SOLN
300.0000 mg | Freq: Four times a day (QID) | INTRAVENOUS | Status: DC
  Administered 2022-08-16 – 2022-08-17 (×4): 300 mg via INTRAVENOUS
  Filled 2022-08-16 (×5): qty 3

## 2022-08-16 MED ORDER — VANCOMYCIN HCL 2000 MG/400ML IV SOLN
2000.0000 mg | Freq: Once | INTRAVENOUS | Status: AC
  Administered 2022-08-16: 2000 mg via INTRAVENOUS
  Filled 2022-08-16: qty 400

## 2022-08-16 NOTE — ED Notes (Signed)
Patient transported to MRI 

## 2022-08-16 NOTE — Progress Notes (Signed)
EEG complete - results pending 

## 2022-08-16 NOTE — Procedures (Signed)
Patient Name: Darrell Russo  MRN: [id number]  Epilepsy Attending: Charlsie Quest  Referring Physician/Provider: Gloris Manchester, MD  Date: 08/16/2022  Duration: 22.35 mins  Patient history: 47yo M with breakthrough seizure getting eeg to evaluate for seizure.  Level of alertness: Awake, asleep  AEDs during EEG study: LCM, VPA  Technical aspects: This EEG study was done with scalp electrodes positioned according to the 10-20 International system of electrode placement. Electrical activity was reviewed with band pass filter of 1-70Hz , sensitivity of 7 uV/mm, display speed of 9mm/sec with a 60Hz  notched filter applied as appropriate. EEG data were recorded continuously and digitally stored.  Video monitoring was available and reviewed as appropriate.  Description: The posterior dominant rhythm consists of 8-9 Hz activity of moderate voltage (25-35 uV) seen predominantly in posterior head regions, symmetric and reactive to eye opening and eye closing. Sleep was characterized by vertex waves, sleep spindles (12 to 14 Hz), maximal frontocentral region. EEG showed intermittent generalized 3 to 6 Hz theta-delta slowing.Photic driving was not seen during photic stimulation. Hyperventilation was not performed.     ABNORMALITY - Intermittent slow, generalized  IMPRESSION: This study is suggestive of mild diffuse encephalopathy, nonspecific etiology. No seizures or epileptiform discharges were seen throughout the recording.  Bobby Ragan Annabelle Harman

## 2022-08-16 NOTE — Consult Note (Signed)
Neurology Consultation Reason for Consult: Possible seizure Requesting Physician: Gloris Manchester  CC: Foaming at the mouth, falling  History is obtained from: Juanita Laster (adoptive?), Gloris Manchester EDP and caregiver from group home  HPI: Darrell Russo is a 47 y.o. male with a past medical history significant for autism, CML, hypothyroidism, hypertension.  He is reportedly in his normal state of health last night at 8:30 PM, when caregiver checked on him he was initially sleeping in his chair took his medications as usual, folded his clothes, ate some popcorn and asked for some water and went to bed.  On arrival for shift they noted that he was foaming at the mouth and shaking. Subsequently he got up but fell down possibly with some right-sided weakness, though he does have a chronic right hip fracture s/p surgery.   His exam was nonfocal for the ED provider other than somnolence but he was noted to be hypothermic and was empirically covered with cefepime and metronidazole for concern for sepsis of unclear etiology.  Infectious workup including chest x-ray, UA, and basic labs have all been unrevealing.  His mental status is rapidly improving during his time in the ED, though he is not yet at baseline  Per caregivers at bedside he does not have a known history of seizures, however he is on two antiseizure medications and they are not sure about the indication for zonisamide.  His Depakote level also resulted within the therapeutic range for his seizure control.  Typically lower doses are used for solely mood or headache issues  Premorbid modified rankin scale:      3 - Moderate disability. Requires some help, but able to walk unassisted.  ROS: Unable to obtain due to baseline autism  Past Medical History:  Diagnosis Date   Autism    Folliculitis    Hypertension    Impulse control disorder    Leukemia (HCC)    TMJ (dislocation of temporomandibular joint)    Past Surgical History:  Procedure  Laterality Date   ANKLE SURGERY     l/ankle fracture 5 years ago   ORIF HIP FRACTURE Right 12/13/2016   Procedure: ORIF of Right Femoral neck fracture;  Surgeon: Roby Lofts, MD;  Location: [facility] OR;  Service: Orthopedics;  Laterality: Right;   Current Outpatient Medications  Medication Instructions   cyclobenzaprine (FLEXERIL) 10 mg, Oral, 3 times daily PRN   diclofenac sodium (VOLTAREN) 1 % GEL Topical, 4 times daily   divalproex (DEPAKOTE ER) 2,000 mg, Oral, Daily   enoxaparin (LOVENOX) 55 mg, Subcutaneous, Every 24 hours   furosemide (LASIX) 20 mg, Oral, Daily   haloperidol (HALDOL) 1.5 mg, Oral, Daily   imatinib (GLEEVEC) 100 MG tablet Take two pills twice daily with meals and large glass of water.Caution:Chemotherapy   levothyroxine (LEVOTHROID) 225 mcg, Oral, Daily   meloxicam (MOBIC) 15 mg, Oral, Daily   metoprolol succinate (TOPROL-XL) 50 mg, Daily   minocycline (MINOCIN) 100 mg, 2 times daily   mupirocin (BACTROBAN) 2 % ointment 3 times daily   nystatin cream (MYCOSTATIN) 1 application , Topical, Daily at bedtime   polyethylene glycol (MIRALAX / GLYCOLAX) 17 g, Oral, 2 times daily   QUEtiapine (SEROQUEL XR) 800 mg, Oral, Daily at bedtime   senna (SENOKOT) 17.2 mg, Oral, Daily PRN   Vitamin D3 1,000 Units, Oral, Daily   zonisamide (ZONEGRAN) 300 mg, Daily     History reviewed. No pertinent family history.  Social History:  reports that he has never smoked. He  has never used smokeless tobacco. He reports that he does not drink alcohol and does not use drugs.   Exam: Current vital signs: BP (!) 103/57   Pulse 68   Temp (!) 90.6 F (32.6 C) (Rectal)   Resp 13   Ht 6\' 1"  (1.854 m)   Wt 100 kg   SpO2 97%   BMI 29.09 kg/m  Vital signs in last 24 hours: Temp:  [90.6 F (32.6 C)-98.6 F (37 C)] 90.6 F (32.6 C) (06/19 0818) Pulse Rate:  [63-69] 68 (06/19 1055) Resp:  [10-15] 13 (06/19 1055) BP: (82-113)/(55-68) 103/57 (06/19 1055) SpO2:  [97 %-100 %] 97 %  (06/19 1055) Weight:  [100 kg] 100 kg (06/19 0808)   Physical Exam  Constitutional: Appears well-developed and well-nourished. Warming blanket in place Psych: Affect pleasant when awake Eyes: No scleral injection HENT: No oropharyngeal obstruction.  MSK: no joint deformities.  Respiratory: Effort normal, non-labored breathing GI: Soft.  No distension. There is no tenderness.  Skin: chronic appearing BLE skin darkening  Neuro: Mental Status: Patient is sleeping but awakens readily, reads my name tag, follows simple commands, falls back asleep readily Cranial Nerves: II: Pupils are equal, round, and reactive to light.   III,IV, VI: Control and instrumentation engineer V: Facial sensation is symmetric to light eyelash brush VII: Facial movement is symmetric.  VIII: hearing is intact to voice Motor/sensory Moving bilateral upper extremities at least antigravity. BLE at least 2/5 throughout on spontaneous movement. Equally reactive to touch in all four extremities Deep Tendon Reflexes: 2+ and symmetric in the brachioradialis and patellae.  Cerebellar: FNF intact bilaterally Gait:  Deferred  I have reviewed labs in epic and the results pertinent to this consultation are:  Basic Metabolic Panel: Recent Labs  Lab 08/16/22 0820 08/16/22 0829 08/16/22 1133  NA 139 142  141 142  K 3.5 3.6  3.6 4.1  CL 107 108  --   CO2 20*  --   --   GLUCOSE 187* 184*  --   BUN 23* 24*  --   CREATININE 1.15 1.00  --   CALCIUM 9.1  --   --   MG 2.0  --   --     CBC: Recent Labs  Lab 08/16/22 0820 08/16/22 0829 08/16/22 1133  WBC 4.7  --   --   NEUTROABS 3.5  --   --   HGB 11.9* 13.3  13.6 11.2*  HCT 36.5* 39.0  40.0 33.0*  MCV 97.6  --   --   PLT 105*  --   --     Coagulation Studies: Recent Labs    08/16/22 0820  LABPROT 15.9*  INR 1.2     I have reviewed the images obtained:  Head CT personally reviewed, agree with radiology:   1. No acute intracranial abnormality. 2. No acute  fracture or traumatic malalignment of the cervical spine.  Impression: Most likely seizure in the setting of autism, no clear trigger at this time. Doubt meningitis given rapid clinical improvement. Does need monitoring for return to baseline. MRI brain w/ and w/o due to history of CML (though in remission), if possible -- if rapidly improving and cannot tolerate scan due to autism, would defer to outpatient basis. While no clearly reported history of seizures, he is on two seizure medications at baseline and autism puts him at substantial risk. Will slightly increase Depakote given no clear inciting factor for this seizure  On a brief review of the literature while Imantinib  is described as being immunosuppressive at times, this is typically early in the course of therapy when white cell count is transiently lower.  He has a normal white cell count and has been on this therapy for some time and therefore I would not consider him significantly immunosuppressed.    Recommendations: - Home Depakote 500 mg BID, increase to 300 mg q6hr IV (consolidate to 600 mg PO BID when reliably taking PO) - MRI brain w and w/o if patient will tolerate, 2 mg versed sedation prior to scan - Continue home zonisamide, if too sleepy to take evening dose, please give vimpat 75 mg once (ordered as PRN) - Routine EEG - I have asked the primary team to verify that Imantinib would not be considered immunosuppressive; if he is immunosuppressed I would recommend lumbar puncture for cell count, protein, glucose, bacterial smear and culture, meningitis/encephalitis PCR panel, and would appreciate ED provider assistance with this, but otherwise would hold off on lumbar puncture given rapid clinical improvement - Antibiotics per primary team, caution with cefepime given that it does lower the seizure threshold - Neurology will follow, appreciate management of comorbidities per primary team  Brooke Dare MD-PhD [facility] [phone] Available 7 AM to 7 PM, outside these hours please contact Neurologist on call listed on AMION

## 2022-08-16 NOTE — ED Triage Notes (Signed)
From group home via ems with c/o lethargy. Per staff he is oriented at baseline, but slower to respond. Patient found on floor by staff this morning, difficult to arouse. LKW 0300. Patient alert to painful stimuli at this time. VSS

## 2022-08-16 NOTE — ED Notes (Signed)
ED TO INPATIENT HANDOFF REPORT  ED Nurse Name and Phone #: cori [phone]  S Name/Age/Gender Wilford Sports 47 y.o. male Room/Bed: 006C/006C  Code Status   Code Status: Full Code  Home/SNF/Other Group home Patient oriented to: self Is this baseline? Yes   Triage Complete: Triage complete  Chief Complaint Acute encephalopathy [G93.40]  Triage Note From group home via ems with c/o lethargy. Per staff he is oriented at baseline, but slower to respond. Patient found on floor by staff this morning, difficult to arouse. LKW 0300. Patient alert to painful stimuli at this time. VSS   Allergies No Known Allergies  Level of Care/Admitting Diagnosis ED Disposition     ED Disposition  Admit   Condition  --   Comment  Hospital Area: [facility] [[id number]]  Level of Care: Progressive [102]  Admit to Progressive based on following criteria: NEUROLOGICAL AND NEUROSURGICAL complex patients with significant risk of instability, who do not meet ICU criteria, yet require close observation or frequent assessment (< / = every 2 - 4 hours) with medical / nursing intervention.  May place patient in observation at [facility] or [facility] if equivalent level of care is available:: No  Covid Evaluation: Asymptomatic - no recent exposure (last 10 days) testing not required  Diagnosis: Acute encephalopathy [621308]  Admitting Physician: Mercie Eon [[id number]]  Attending Physician: Mercie Eon [[id number]]          B Medical/Surgery History Past Medical History:  Diagnosis Date   Autism    Folliculitis    Hypertension    Impulse control disorder    Leukemia (HCC)    TMJ (dislocation of temporomandibular joint)    Past Surgical History:  Procedure Laterality Date   ANKLE SURGERY     l/ankle fracture 5 years ago   ORIF HIP FRACTURE Right 12/13/2016   Procedure: ORIF of Right Femoral neck fracture;  Surgeon: Roby Lofts, MD;  Location: [facility] OR;  Service: Orthopedics;   Laterality: Right;     A IV Location/Drains/Wounds Patient Lines/Drains/Airways Status     Active Line/Drains/Airways     Name Placement date Placement time Site Days   Peripheral IV 08/16/22 20 G Left Antecubital 08/16/22  --  Antecubital  less than 1   Peripheral IV 08/16/22 20 G Anterior;Right Forearm 08/16/22  0826  Forearm  less than 1   Incision (Closed) 12/13/16 Hip Right 12/13/16  2042  -- 2072            Intake/Output Last 24 hours  Intake/Output Summary (Last 24 hours) at 08/16/2022 1406 Last data filed at 08/16/2022 1253 Gross per 24 hour  Intake 2199 ml  Output --  Net 2199 ml    Labs/Imaging Results for orders placed or performed during the hospital encounter of 08/16/22 (from the past 48 hour(s))  CBG monitoring, ED     Status: Abnormal   Collection Time: 08/16/22  8:01 AM  Result Value Ref Range   Glucose-Capillary 176 (H) 70 - 99 mg/dL    Comment: Glucose reference range applies only to samples taken after fasting for at least 8 hours.  Resp panel by RT-PCR (RSV, Flu A&B, Covid) Anterior Nasal Swab     Status: None   Collection Time: 08/16/22  8:18 AM   Specimen: Anterior Nasal Swab  Result Value Ref Range   SARS Coronavirus 2 by RT PCR NEGATIVE NEGATIVE   Influenza A by PCR NEGATIVE NEGATIVE   Influenza B by PCR NEGATIVE NEGATIVE  Comment: (NOTE) The Xpert Xpress SARS-CoV-2/FLU/RSV plus assay is intended as an aid in the diagnosis of influenza from Nasopharyngeal swab specimens and should not be used as a sole basis for treatment. Nasal washings and aspirates are unacceptable for Xpert Xpress SARS-CoV-2/FLU/RSV testing.  Fact Sheet for Patients: BloggerCourse.com  Fact Sheet for Healthcare Providers: SeriousBroker.it  This test is not yet approved or cleared by the [place] FDA and has been authorized for detection and/or diagnosis of SARS-CoV-2 by FDA under an Emergency Use Authorization  (EUA). This EUA will remain in effect (meaning this test can be used) for the duration of the COVID-19 declaration under Section 564(b)(1) of the Act, 21 U.S.C. section 360bbb-3(b)(1), unless the authorization is terminated or revoked.     Resp Syncytial Virus by PCR NEGATIVE NEGATIVE    Comment: (NOTE) Fact Sheet for Patients: BloggerCourse.com  Fact Sheet for Healthcare Providers: SeriousBroker.it  This test is not yet approved or cleared by the [place] FDA and has been authorized for detection and/or diagnosis of SARS-CoV-2 by FDA under an Emergency Use Authorization (EUA). This EUA will remain in effect (meaning this test can be used) for the duration of the COVID-19 declaration under Section 564(b)(1) of the Act, 21 U.S.C. section 360bbb-3(b)(1), unless the authorization is terminated or revoked.  Performed at [facility], [address], [place], [place] [address]   Lactic acid, plasma     Status: Abnormal   Collection Time: 08/16/22  8:20 AM  Result Value Ref Range   Lactic Acid, Venous 5.5 (HH) 0.5 - 1.9 mmol/L    Comment: CRITICAL RESULT CALLED TO, READ BACK BY AND VERIFIED WITH Trejan Buda RN.@[email or web address]  ON 6.19.2024 BY TCALDWELL MT. Performed at [facility], [address] [place], [place] [address]   Comprehensive metabolic panel     Status: Abnormal   Collection Time: 08/16/22  8:20 AM  Result Value Ref Range   Sodium 139 135 - 145 mmol/L   Potassium 3.5 3.5 - 5.1 mmol/L   Chloride 107 98 - 111 mmol/L   CO2 20 (L) 22 - 32 mmol/L   Glucose, Bld 187 (H) 70 - 99 mg/dL    Comment: Glucose reference range applies only to samples taken after fasting for at least 8 hours.   BUN 23 (H) 6 - 20 mg/dL   Creatinine, Ser 0.98 0.61 - 1.24 mg/dL   Calcium 9.1 8.9 - 11.9 mg/dL   Total Protein 6.4 (L) 6.5 - 8.1 g/dL   Albumin 3.6 3.5 - 5.0 g/dL   AST 37 15 - 41 U/L   ALT 31 0 - 44 U/L   Alkaline  Phosphatase 56 38 - 126 U/L   Total Bilirubin 0.3 0.3 - 1.2 mg/dL   GFR, Estimated >14 >78 mL/min    Comment: (NOTE) Calculated using the CKD-EPI Creatinine Equation (2021)    Anion gap 12 5 - 15    Comment: Performed at [facility], [address], [place], [place] [address]  CBC with Differential     Status: Abnormal   Collection Time: 08/16/22  8:20 AM  Result Value Ref Range   WBC 4.7 4.0 - 10.5 K/uL   RBC 3.74 (L) 4.22 - 5.81 MIL/uL   Hemoglobin 11.9 (L) 13.0 - 17.0 g/dL   HCT 13.0 (L) 86.5 - 78.4 %   MCV 97.6 80.0 - 100.0 fL   MCH 31.8 26.0 - 34.0 pg   MCHC 32.6 30.0 - 36.0 g/dL  RDW 16.5 (H) 11.5 - 15.5 %   Platelets 105 (L) 150 - 400 K/uL   nRBC 0.0 0.0 - 0.2 %   Neutrophils Relative % 73 %   Neutro Abs 3.5 1.7 - 7.7 K/uL   Lymphocytes Relative 22 %   Lymphs Abs 1.0 0.7 - 4.0 K/uL   Monocytes Relative 5 %   Monocytes Absolute 0.2 0.1 - 1.0 K/uL   Eosinophils Relative 0 %   Eosinophils Absolute 0.0 0.0 - 0.5 K/uL   Basophils Relative 0 %   Basophils Absolute 0.0 0.0 - 0.1 K/uL   Immature Granulocytes 0 %   Abs Immature Granulocytes 0.01 0.00 - 0.07 K/uL    Comment: Performed at [facility], [address], [place], [place] [address]  Protime-INR     Status: Abnormal   Collection Time: 08/16/22  8:20 AM  Result Value Ref Range   Prothrombin Time 15.9 (H) 11.4 - 15.2 seconds   INR 1.2 0.8 - 1.2    Comment: (NOTE) INR goal varies based on device and disease states. Performed at [facility], [address], [place], [place] [address]   Troponin I (High Sensitivity)     Status: None   Collection Time: 08/16/22  8:20 AM  Result Value Ref Range   Troponin I (High Sensitivity) 5 <18 ng/L    Comment: (NOTE) Elevated high sensitivity troponin I (hsTnI) values and significant  changes across serial measurements may suggest ACS but many other  chronic and acute conditions are known to elevate hsTnI results.  Refer to the "Links" section for chest  pain algorithms and additional  guidance. Performed at [facility], [address] [place], [place] [address]   Magnesium     Status: None   Collection Time: 08/16/22  8:20 AM  Result Value Ref Range   Magnesium 2.0 1.7 - 2.4 mg/dL    Comment: Performed at [facility], [address], [place], [place] [address]  Ammonia     Status: None   Collection Time: 08/16/22  8:20 AM  Result Value Ref Range   Ammonia 27 9 - 35 umol/L    Comment: Performed at [facility], [address] [place], [place] [address]  Ethanol     Status: None   Collection Time: 08/16/22  8:20 AM  Result Value Ref Range   Alcohol, Ethyl (B) <10 <10 mg/dL    Comment: (NOTE) Lowest detectable limit for serum alcohol is 10 mg/dL.  For medical purposes only. Performed at [facility], [address], [place], [place] [address]   Salicylate level     Status: Abnormal   Collection Time: 08/16/22  8:20 AM  Result Value Ref Range   Salicylate Lvl <7.0 (L) 7.0 - 30.0 mg/dL    Comment: Performed at [facility], [address], [place], [place] [address]  Acetaminophen level     Status: Abnormal   Collection Time: 08/16/22  8:20 AM  Result Value Ref Range   Acetaminophen (Tylenol), Serum <10 (L) 10 - 30 ug/mL    Comment: (NOTE) Therapeutic concentrations vary significantly. A range of 10-30 ug/mL  may be an effective concentration for many patients. However, some  are best treated at concentrations outside of this range. Acetaminophen concentrations >150 ug/mL at 4 hours after ingestion  and >50 ug/mL at 12 hours after ingestion are often associated with  toxic reactions.  Performed at [facility],  [address] [place], [place] [address]   TSH     Status: None   Collection Time: 08/16/22  8:20 AM  Result Value Ref Range   TSH 1.170 0.350 - 4.500 uIU/mL    Comment: Performed by a 3rd Generation assay with a functional sensitivity of <=0.01 uIU/mL. Performed at [facility], [address], [place], [place] [address]   Valproic acid level     Status: None   Collection Time: 08/16/22  8:20 AM  Result Value Ref Range   Valproic Acid Lvl 53 50.0 - 100.0 ug/mL    Comment: Performed at [facility], [address], [place], [place] [address]  T4, free     Status: None   Collection Time: 08/16/22  8:20 AM  Result Value Ref Range   Free T4 1.07 0.61 - 1.12 ng/dL    Comment: (NOTE) Biotin ingestion may interfere with free T4 tests. If the results are inconsistent with the TSH level, previous test results, or the clinical presentation, then consider biotin interference. If needed, order repeat testing after stopping biotin. Performed at [facility], [address], [place], [place] [address]   CK     Status: None   Collection Time: 08/16/22  8:20 AM  Result Value Ref Range   Total CK 263 49 - 397 U/L    Comment: Performed at [facility], [address], [place], [place] [address]  I-Stat Chem 8, ED     Status: Abnormal   Collection Time: 08/16/22  8:29 AM  Result Value Ref Range   Sodium 142 135 - 145 mmol/L   Potassium 3.6 3.5 - 5.1 mmol/L   Chloride 108 98 - 111 mmol/L   BUN 24 (H) 6 - 20 mg/dL   Creatinine, Ser 1.02 0.61 - 1.24 mg/dL   Glucose, Bld 725 (H) 70 - 99 mg/dL    Comment: Glucose reference range applies only to samples taken after fasting for at least 8 hours.   Calcium, Ion 1.31 1.15 - 1.40 mmol/L   TCO2 22 22 - 32 mmol/L   Hemoglobin 13.3 13.0 - 17.0 g/dL   HCT 36.6 44.0 - 34.7 %  I-Stat venous blood gas, ED     Status: Abnormal   Collection Time: 08/16/22  8:29 AM  Result Value Ref Range   pH, Ven 7.211 (L) 7.25 - 7.43   pCO2, Ven 49.2 44 - 60 mmHg   pO2, Ven 68 (H) 32 - 45 mmHg   Bicarbonate 19.7 (L) 20.0 - 28.0 mmol/L   TCO2 21 (L) 22 - 32 mmol/L   O2 Saturation 89 %   Acid-base deficit 8.0 (H) 0.0 - 2.0 mmol/L   Sodium 141 135 - 145 mmol/L   Potassium 3.6 3.5 - 5.1 mmol/L   Calcium, Ion 1.26  1.15 - 1.40 mmol/L   HCT 40.0 39.0 - 52.0 %   Hemoglobin 13.6 13.0 - 17.0 g/dL   Sample type VENOUS   Urinalysis, w/ Reflex to Culture (Infection Suspected) -Urine, Clean Catch     Status: Abnormal   Collection Time: 08/16/22  9:53 AM  Result Value Ref Range   Specimen Source URINE, CLEAN CATCH    Color, Urine YELLOW YELLOW   APPearance CLEAR CLEAR   Specific Gravity, Urine 1.015 1.005 - 1.030   pH 5.0 5.0 - 8.0   Glucose, UA NEGATIVE NEGATIVE mg/dL   Hgb urine dipstick NEGATIVE NEGATIVE   Bilirubin Urine NEGATIVE NEGATIVE  Ketones, ur 5 (A) NEGATIVE mg/dL   Protein, ur NEGATIVE NEGATIVE mg/dL   Nitrite NEGATIVE NEGATIVE   Leukocytes,Ua NEGATIVE NEGATIVE   RBC / HPF 0-5 0 - 5 RBC/hpf   WBC, UA 0-5 0 - 5 WBC/hpf    Comment:        Reflex urine culture not performed if WBC <=10, OR if Squamous epithelial cells >5. If Squamous epithelial cells >5 suggest recollection.    Bacteria, UA NONE SEEN NONE SEEN   Squamous Epithelial / HPF 0-5 0 - 5 /HPF   Mucus PRESENT     Comment: Performed at [facility], [address] [place], [place] [address]  Rapid urine drug screen (hospital performed)     Status: None   Collection Time: 08/16/22  9:53 AM  Result Value Ref Range   Opiates NONE DETECTED NONE DETECTED   Cocaine NONE DETECTED NONE DETECTED   Benzodiazepines NONE DETECTED NONE DETECTED   Amphetamines NONE DETECTED NONE DETECTED   Tetrahydrocannabinol NONE DETECTED NONE DETECTED   Barbiturates NONE DETECTED NONE DETECTED    Comment: (NOTE) DRUG SCREEN FOR MEDICAL PURPOSES ONLY.  IF CONFIRMATION IS NEEDED FOR ANY PURPOSE, NOTIFY LAB WITHIN 5 DAYS.  LOWEST DETECTABLE LIMITS FOR URINE DRUG SCREEN Drug Class                     Cutoff (ng/mL) Amphetamine and metabolites    1000 Barbiturate and metabolites    200 Benzodiazepine                 200 Opiates and metabolites        300 Cocaine and metabolites        300 THC                            50 Performed at  [facility], [address], [place], [place] [address]   Lactic acid, plasma     Status: Abnormal   Collection Time: 08/16/22 10:18 AM  Result Value Ref Range   Lactic Acid, Venous 3.7 (HH) 0.5 - 1.9 mmol/L    Comment: CRITICAL VALUE NOTED. VALUE IS CONSISTENT WITH PREVIOUSLY REPORTED/CALLED VALUE Performed at [facility], [address] [place], [place] [address]   Troponin I (High Sensitivity)     Status: None   Collection Time: 08/16/22 10:18 AM  Result Value Ref Range   Troponin I (High Sensitivity) 11 <18 ng/L    Comment: (NOTE) Elevated high sensitivity troponin I (hsTnI) values and significant  changes across serial measurements may suggest ACS but many other  chronic and acute conditions are known to elevate hsTnI results.  Refer to the "Links" section for chest pain algorithms and additional  guidance. Performed at [facility], [address], [place], [place] [address]   I-Stat venous blood gas, [facility] ED, [facility], [facility])     Status: Abnormal   Collection Time: 08/16/22 11:33 AM  Result Value Ref Range   pH, Ven 7.268 7.25 - 7.43   pCO2, Ven 45.8 44 - 60 mmHg   pO2, Ven 116 (H) 32 - 45 mmHg   Bicarbonate 20.9 20.0 - 28.0 mmol/L   TCO2 22 22 - 32 mmol/L   O2 Saturation 98 %   Acid-base deficit 6.0 (H) 0.0 - 2.0 mmol/L   Sodium 142 135 - 145 mmol/L   Potassium 4.1 3.5 - 5.1 mmol/L   Calcium,  Ion 1.27 1.15 - 1.40 mmol/L   HCT 33.0 (L) 39.0 - 52.0 %   Hemoglobin 11.2 (L) 13.0 - 17.0 g/dL   Sample type VENOUS    DG Chest Port 1 View  Result Date: 08/16/2022 CLINICAL DATA:  Found down.  Lethargic and slow to respond. EXAM: PORTABLE CHEST 1 VIEW COMPARISON:  Same-day CT chest. FINDINGS: The cardiomediastinal silhouette is normal. There is no focal consolidation or pulmonary edema. There is no pleural effusion or pneumothorax There is no acute osseous abnormality. IMPRESSION: No radiographic evidence of acute cardiopulmonary process. Electronically Signed    By: Lesia Hausen M.D.   On: 08/16/2022 09:24   CT CHEST ABDOMEN PELVIS WO CONTRAST  Result Date: 08/16/2022 CLINICAL DATA:  Fall.  Intoxicated. EXAM: CT CHEST, ABDOMEN AND PELVIS WITHOUT CONTRAST TECHNIQUE: Multidetector CT imaging of the chest, abdomen and pelvis was performed following the standard protocol without IV contrast. RADIATION DOSE REDUCTION: This exam was performed according to the departmental dose-optimization program which includes automated exposure control, adjustment of the mA and/or kV according to patient size and/or use of iterative reconstruction technique. COMPARISON:  None Available. FINDINGS: CT CHEST FINDINGS Cardiovascular: No significant vascular findings. Normal heart size. No pericardial effusion. Mediastinum/Nodes: No enlarged mediastinal, hilar, or axillary lymph nodes. Thyroid gland, trachea, and esophagus demonstrate no significant findings. Lungs/Pleura: Mild bibasilar atelectasis. No focal consolidation, pleural effusion, or pneumothorax. Musculoskeletal: No acute or significant osseous findings. Multiple old healed left rib fractures. Right os acromiale. CT ABDOMEN PELVIS FINDINGS Hepatobiliary: No focal liver abnormality is seen. No gallstones, gallbladder wall thickening, or biliary dilatation. Pancreas: Unremarkable. No pancreatic ductal dilatation or surrounding inflammatory changes. Spleen: Normal in size without focal abnormality. Adrenals/Urinary Tract: The adrenal glands are unremarkable. Punctate calculus in the lower pole of the right kidney. No renal mass or hydronephrosis. Bladder is unremarkable. Stomach/Bowel: Stomach is within normal limits. Appendix appears normal. No evidence of bowel wall thickening, distention, or inflammatory changes. Vascular/Lymphatic: No significant vascular findings are present. No enlarged abdominal or pelvic lymph nodes. Reproductive: Prostate is unremarkable. Other: No abdominal wall hernia or abnormality. No abdominopelvic  ascites. No pneumoperitoneum. Musculoskeletal: No acute osseous findings. Old healed right femoral neck fracture status post fixation. 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 IMPRESSION: 1. No acute traumatic injury within the chest, abdomen, or pelvis. 2. 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 3. Punctate nonobstructive right nephrolithiasis. Electronically Signed   By: Obie Dredge M.D.   On: 08/16/2022 09:15   CT Head Wo Contrast  Result Date: 08/16/2022 CLINICAL DATA:  Mental status change, unknown cause; Neck trauma, intoxicated or obtunded (Age >= 16y). EXAM: CT HEAD WITHOUT CONTRAST CT CERVICAL SPINE WITHOUT CONTRAST TECHNIQUE: Multidetector CT imaging of the head and cervical spine was performed following the standard protocol without intravenous contrast. Multiplanar CT image reconstructions of the cervical spine were also generated. RADIATION DOSE REDUCTION: This exam was performed according to the departmental dose-optimization program which includes automated exposure control, adjustment of the mA and/or kV according to patient size and/or use of iterative reconstruction technique. COMPARISON:  Head CT 12/26/2010. FINDINGS: CT HEAD FINDINGS Brain: No acute intracranial hemorrhage. Gray-white differentiation is preserved. No hydrocephalus or extra-axial collection. No mass effect or midline shift. Vascular: No hyperdense vessel or unexpected calcification. Skull: No calvarial fracture or suspicious bone lesion. Skull base is unremarkable. Sinuses/Orbits: Unremarkable. Other: None. CT CERVICAL SPINE FINDINGS Alignment: Normal. Skull base and vertebrae: No acute fracture. Normal  craniocervical junction. No suspicious bone lesions. Soft tissues and spinal canal: No prevertebral fluid or swelling. No visible canal hematoma. Disc levels:  No significant degenerative change. Upper chest: Unremarkable. Other: Atherosclerotic  calcifications of the carotid bulbs. IMPRESSION: 1. No acute intracranial abnormality. 2. No acute fracture or traumatic malalignment of the cervical spine. Electronically Signed   By: Orvan Falconer M.D.   On: 08/16/2022 08:55   CT Cervical Spine Wo Contrast  Result Date: 08/16/2022 CLINICAL DATA:  Mental status change, unknown cause; Neck trauma, intoxicated or obtunded (Age >= 16y). EXAM: CT HEAD WITHOUT CONTRAST CT CERVICAL SPINE WITHOUT CONTRAST TECHNIQUE: Multidetector CT imaging of the head and cervical spine was performed following the standard protocol without intravenous contrast. Multiplanar CT image reconstructions of the cervical spine were also generated. RADIATION DOSE REDUCTION: This exam was performed according to the departmental dose-optimization program which includes automated exposure control, adjustment of the mA and/or kV according to patient size and/or use of iterative reconstruction technique. COMPARISON:  Head CT 12/26/2010. FINDINGS: CT HEAD FINDINGS Brain: No acute intracranial hemorrhage. Gray-white differentiation is preserved. No hydrocephalus or extra-axial collection. No mass effect or midline shift. Vascular: No hyperdense vessel or unexpected calcification. Skull: No calvarial fracture or suspicious bone lesion. Skull base is unremarkable. Sinuses/Orbits: Unremarkable. Other: None. CT CERVICAL SPINE FINDINGS Alignment: Normal. Skull base and vertebrae: No acute fracture. Normal craniocervical junction. No suspicious bone lesions. Soft tissues and spinal canal: No prevertebral fluid or swelling. No visible canal hematoma. Disc levels:  No significant degenerative change. Upper chest: Unremarkable. Other: Atherosclerotic calcifications of the carotid bulbs. IMPRESSION: 1. No acute intracranial abnormality. 2. No acute fracture or traumatic malalignment of the cervical spine. Electronically Signed   By: Orvan Falconer M.D.   On: 08/16/2022 08:55    Pending Labs Unresulted  Labs (From admission, onward)     Start     Ordered   08/17/22 0500  Basic metabolic panel  Tomorrow morning,   R        08/16/22 1306   08/17/22 0500  CBC  Tomorrow morning,   R        08/16/22 1306   08/16/22 1500  Lactic acid, plasma  STAT Now then every 3 hours,   R (with STAT occurrences)      08/16/22 1307   08/16/22 1306  HIV Antibody (routine testing w rflx)  (HIV Antibody (Routine testing w reflex) panel)  Once,   R        08/16/22 1306   08/16/22 0933  T3, free  Once,   URGENT        08/16/22 0932   08/16/22 0818  Blood Culture (routine x 2)  (Septic presentation on arrival (screening labs, nursing and treatment orders for obvious sepsis))  BLOOD CULTURE X 2,   STAT      08/16/22 0818            Vitals/Pain Today's Vitals   08/16/22 1200 08/16/22 1227 08/16/22 1230 08/16/22 1245  BP: (!) 95/59  103/62 102/61  Pulse:    74  Resp: 12  14 17   Temp:  (!) 95 F (35 C)    TempSrc:  Rectal    SpO2: 97%   98%  Weight:      Height:      PainSc:        Isolation Precautions No active isolations  Medications Medications  vancomycin (VANCOREADY) IVPB 1500 mg/300 mL (has no administration in time range)  valproate (DEPACON)  300 mg in dextrose 5 % 50 mL IVPB (has no administration in time range)  cefTRIAXone (ROCEPHIN) 2 g in sodium chloride 0.9 % 100 mL IVPB (has no administration in time range)  midazolam (VERSED) injection 0.5 mg (has no administration in time range)  enoxaparin (LOVENOX) injection 40 mg (has no administration in time range)  polyethylene glycol (MIRALAX / GLYCOLAX) packet 17 g (has no administration in time range)  acetaminophen (TYLENOL) tablet 650 mg (has no administration in time range)    Or  acetaminophen (TYLENOL) suppository 650 mg (has no administration in time range)  lactated ringers bolus 1,000 mL (has no administration in time range)  lacosamide (VIMPAT) 75 mg in sodium chloride 0.9 % 25 mL IVPB (has no administration in time range)   vancomycin (VANCOREADY) IVPB 2000 mg/400 mL (has no administration in time range)  sodium chloride 0.9 % bolus 1,000 mL (0 mLs Intravenous Stopped 08/16/22 0922)  sodium chloride 0.9 % bolus 1,000 mL (0 mLs Intravenous Stopped 08/16/22 1216)  ceFEPIme (MAXIPIME) 2 g in sodium chloride 0.9 % 100 mL IVPB (0 g Intravenous Stopped 08/16/22 1155)  metroNIDAZOLE (FLAGYL) IVPB 500 mg (0 mg Intravenous Stopped 08/16/22 1253)  gadobutrol (GADAVIST) 1 MMOL/ML injection 10 mL (10 mLs Intravenous Contrast Given 08/16/22 1337)    Mobility walks     Focused Assessments Neuro Assessment Handoff:  Swallow screen pass? No          Neuro Assessment:   Neuro Checks:      Has TPA been given? No If patient is a Neuro Trauma and patient is going to OR before floor call report to 4N Charge nurse: [phone] or [phone]   R Recommendations: See Admitting Provider Note  Report given to:   Additional Notes: patient found on floor this morning. Arrived rousable to painful stimuli with temp 90.6 rectal. Patient was worked up for sepsis. They are ruling out seizure. MRI results are pending. Patient is IDD, so confused at baseline, but typically walks and talks. Upon arrival the patient was not speaking..he is talking now, but still pretty sleepy. Mother is at bedside.

## 2022-08-16 NOTE — Progress Notes (Signed)
Spoke with RN to coordinate a good time for EEG waiting to coordinate good time around MRI. Will check back later as schedule permits for EEG.

## 2022-08-16 NOTE — Progress Notes (Signed)
Pharmacy Antibiotic Note  Darrell Russo is a 47 y.o. male admitted on 08/16/2022 presenting with AMS and fatigue from group home, concern for sepsis.  Pharmacy has been consulted for vancomycin and cefepime dosing.  Plan: Vancomycin 2g IV x 1, then 1500 mg IV q 12h (eAUC 453) Cefepime 2g IV q 8h Monitor renal function, Cx and clinical progression to narrow Vancomycin levels as indicated  Height: 6\' 1"  (185.4 cm) Weight: 100 kg (220 lb 7.4 oz) IBW/kg (Calculated) : 79.9  Temp (24hrs), Avg:94.6 F (34.8 C), Min:90.6 F (32.6 C), Max:98.6 F (37 C)  Recent Labs  Lab 08/16/22 0820 08/16/22 0829  WBC 4.7  --   CREATININE 1.15 1.00  LATICACIDVEN 5.5*  --     Estimated Creatinine Clearance: 116 mL/min (by C-G formula based on SCr of 1 mg/dL).    No Known Allergies  Daylene Posey, PharmD, [facility] Clinical Pharmacist ED Pharmacist Phone # [phone] 08/16/2022 11:01 AM

## 2022-08-16 NOTE — H&P (Signed)
Date: 08/16/2022               Patient Name:  Darrell Russo MRN: [id number]  DOB: [birth year] Age / Sex: 47 y.o., male   PCP: [name], [name]         Medical Service: Internal Medicine Teaching Service         Attending Physician: Dr. Mercie Eon, MD    First Contact: Dr. Modena Slater Pager: [phone]  Second Contact: Dr. Champ Mungo  Pager: [phone]       After Hours (After 5p/  First Contact Pager: [phone]  weekends / holidays): Second Contact Pager: [phone]   Chief Concern: Acute encephalopathy  History of Present Illness:   Darrell Russo is a 47 year old male living with autism, hypertension, hypothyroidism, CML, mood disorder, presented to the emergency room for acute encephalopathy.  History was obtained from patient's mother and group home employee.  His mother last saw him yesterday and stated that he was in his usual state of health.  His normal baseline is talkative with normal interaction.  His group home employees last saw him around 8 PM, patient took his medications, ate some snacks and went to bed.  This morning, he was found on the floor, minimally responsive.  He tried to get up and fell on his right side.  His mom said that his mouth was foaming at that time.  They deny any change in his medications recently.  No history of seizure in the past.  In the ED, patient was minimally responsive.  He was hypothermic and hypotensive.  Sepsis protocol was initiated.  He was started on vancomycin, cefepime and Flagyl.  Neurology was consulted.  Meds:  Current Meds  Medication Sig   divalproex (DEPAKOTE ER) 500 MG 24 hr tablet Take 500 mg by mouth 2 (two) times daily.   doxepin (SINEQUAN) 25 MG capsule Take 25 mg by mouth at bedtime as needed (Itching).   furosemide (LASIX) 20 MG tablet Take 20 mg by mouth daily.   haloperidol (HALDOL) 0.5 MG tablet Take 1.5 mg by mouth at bedtime.   hydrOXYzine (ATARAX) 25 MG tablet Take 25 mg by mouth 3 (three) times daily.    imatinib (GLEEVEC) 100 MG tablet Take two pills twice daily with meals and large glass of water.Caution:Chemotherapy (Patient taking differently: Take 100 mg by mouth 2 (two) times daily.)   levothyroxine (SYNTHROID) 200 MCG tablet Take 200 mcg by mouth daily.   meloxicam (MOBIC) 15 MG tablet Take 15 mg by mouth daily.   metoprolol (TOPROL-XL) 50 MG 24 hr tablet Take 50 mg by mouth daily.     miconazole (MICOTIN) 2 % powder Apply 1 Application topically 2 (two) times daily.   minocycline (MINOCIN,DYNACIN) 100 MG capsule Take 100 mg by mouth 2 (two) times daily.     nystatin cream (MYCOSTATIN) Apply 1 application topically at bedtime.   polyethylene glycol (MIRALAX / GLYCOLAX) packet Take 17 g by mouth 2 (two) times daily. (Patient taking differently: Take 17 g by mouth daily.)   triamcinolone ointment (KENALOG) 0.1 % Apply 1 Application topically 2 (two) times daily.   vitamin D3 (CHOLECALCIFEROL) 25 MCG tablet Take 1,000 Units by mouth daily.   zonisamide (ZONEGRAN) 100 MG capsule Take 300 mg by mouth daily.       Allergies: Allergies as of 08/16/2022   (No Known Allergies)   Past Medical History:  Diagnosis Date   Autism    Folliculitis    Hypertension  Impulse control disorder    Leukemia (HCC)    TMJ (dislocation of temporomandibular joint)     Family History:  Father: Passed away from heart attack  Social History:  -Lives in a group home since 1992 -No reported alcohol, smoking or drug use  Review of Systems: A complete ROS was negative except as per HPI.   Physical Exam: Blood pressure 102/61, pulse 74, temperature (!) 95 F (35 C), temperature source Rectal, resp. rate 17, height 6\' 1"  (1.854 m), weight 100 kg, SpO2 98 %. Physical Exam Constitutional:      Comments: Patient was somnolent but more awake on my assessment.  He was able to say my name but could not engage in a meaningful conversation.  HENT:     Head: Normocephalic.  Eyes:     General:         Right eye: No discharge.        Left eye: No discharge.     Conjunctiva/sclera: Conjunctivae normal.  Cardiovascular:     Rate and Rhythm: Normal rate and regular rhythm.  Pulmonary:     Effort: Pulmonary effort is normal. No respiratory distress.     Breath sounds: Normal breath sounds. No wheezing.  Abdominal:     General: Bowel sounds are normal. There is no distension.     Palpations: Abdomen is soft.     Tenderness: There is no abdominal tenderness.  Musculoskeletal:        General: Normal range of motion.     Cervical back: Normal range of motion and neck supple. No rigidity.  Skin:    General: Skin is warm.     Coloration: Skin is not jaundiced.     Findings: No rash.  Neurological:     Comments: Neuroexam due to patient's mental status.  He has grossly 5/5 strength of all extremities.  Psychiatric:        Mood and Affect: Mood normal.      EKG: personally reviewed my interpretation is normal sinus rhythm  Assessment & Plan by Problem: Principal Problem:   Acute encephalopathy Active Problems:   Leukemia (HCC)   Essential hypertension   Autism   Mood disorder in conditions classified elsewhere   Hypothyroidism  Acute encephalopathy Broad differential for his change in mental status.  The most likely diagnosis is seizure due to his mouth foaming, postictal presentation and quick recovery.   Infection is also possible given his hypothermia and hypotension but there is no clear source of infection at this time (negative UA and CT chest).  Blood cultures pending.  Less likely to be CNS infection given his rapid recovery and the lack of nuchal rigidity.  I do not think that patient is immunosuppressed  (in the setting of CML on imatinib ). Both Neurology and I agree with holding off on lumbar puncture for now.  However will consider if his clinical presentation starts to trend towards the wrong direction.  Continue vancomycin and ceftriaxone for now.  I discontinued cefepime  to reduce risk of worsening encephalopathy. Otherwise no intracranial bleeding found on CT head.  UDS was clean.  Depakote level was therapeutic.  TSH and T4 were within normal limits. -Appreciate neurology recommendation -Continue EEG and obtain brain MRI -Will increase his Depakote to 300 q6h  -SLP before starting diet and oral medications. -Holding home for other centrally acting medication such as Haldol and hydroxyzine -If he can take medication, will resume zonisamide.  Otherwise IV Vimpat tonight.  -PT/OT will  need to be ordered tomorrow  Essential hypertension Hold off metoprolol in the setting of hypotension  Lactic acidosis Infections vs hypoperfusion from seizures.  He has received 2 unit of fluid.  Will give an additional 1 L fluid bolus and trend lactic acid.  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  WBC is within normal limit. -Resume imatinib when able to take p.o.  Hypothyroidism TSH and T4 within normal limits.  Resume levothyroxine in a.m. if able to take oral.  Autism Mood disorder -IV Depakote -Resume zonisamide able to take p.o.  Otherwise IV Vimpat tonight  Avascular necrosis of the right femoral head. Follow-up with orthopedic outpatient -PT OT in a.m.  Diet: N.p.o. Full code IVF: LR DVT: Lovenox  Dispo: Admit patient to Observation with expected length of stay less than 2 midnights.  Signed: Doran Stabler, DO 08/16/2022, 2:05 PM  Pager: [phone] After 5pm on weekdays and 1pm on weekends: On Call pager: [phone]

## 2022-08-16 NOTE — ED Provider Notes (Signed)
[place] EMERGENCY DEPARTMENT AT [facility] Provider Note   CSN: [id number] Arrival date & time: 08/16/22  0757     History  Chief Complaint  Patient presents with   Fatigue    Darrell Russo is a 47 y.o. male.  HPI Patient presents for altered mental status.  Medical history includes autism, HTN, CML.  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  He arrives via EMS from group home.  Patient was reportedly last seen normal at 3:00 AM.  He was found on floor this morning by staff at group home, minimally responsive.  Group home reports that he would not be able to access medications.  Patient's mother states that he is ambulatory and conversant at baseline.  He seemed to be doing well yesterday.    Home Medications Prior to Admission medications   Medication Sig Start Date End Date Taking? Authorizing Provider  Cholecalciferol (VITAMIN D3) 1000 UNITS CAPS Take 1,000 Units by mouth daily.     [provider]  cyclobenzaprine (FLEXERIL) 10 MG tablet Take 1 tablet (10 mg total) by mouth 3 (three) times daily as needed for muscle spasms. 11/28/16   Rise Mu, PA-C  diclofenac sodium (VOLTAREN) 1 % GEL Apply topically 4 (four) times daily.    [provider]  divalproex (DEPAKOTE ER) 500 MG 24 hr tablet Take 2,000 mg by mouth daily.     [provider]  enoxaparin (LOVENOX) 60 MG/0.6ML injection Inject 0.55 mLs (55 mg total) into the skin daily. 12/18/16 01/17/17  HaddixGillie Manners, MD  furosemide (LASIX) 20 MG tablet Take 20 mg by mouth daily.    [provider]  haloperidol (HALDOL) 1 MG tablet Take 1.5 mg by mouth daily.     [provider]  imatinib (GLEEVEC) 100 MG tablet Take two pills twice daily with meals and large glass of water.Caution:Chemotherapy 10/25/15   Benjiman Core, MD  levothyroxine (LEVOTHROID) 300 MCG tablet Take 225 mcg by mouth daily.     [provider]  meloxicam (MOBIC) 15 MG  tablet Take 15 mg by mouth daily.    [provider]  metoprolol (TOPROL-XL) 50 MG 24 hr tablet Take 50 mg by mouth daily.      [provider]  minocycline (MINOCIN,DYNACIN) 100 MG capsule Take 100 mg by mouth 2 (two) times daily.      [provider]  mupirocin (BACTROBAN) 2 % ointment Apply topically 3 (three) times daily.      [provider]  nystatin cream (MYCOSTATIN) Apply 1 application topically at bedtime.    [provider]  polyethylene glycol (MIRALAX / GLYCOLAX) packet Take 17 g by mouth 2 (two) times daily. 12/18/16   Hongalgi, Maximino Greenland, MD  QUEtiapine (SEROQUEL XR) 200 MG 24 hr tablet Take 800 mg by mouth at bedtime.     [provider]  senna (SENOKOT) 8.6 MG TABS tablet Take 2 tablets (17.2 mg total) by mouth daily as needed for mild constipation or moderate constipation. 12/18/16   Hongalgi, Maximino Greenland, MD  zonisamide (ZONEGRAN) 100 MG capsule Take 300 mg by mouth daily.      [provider]      Allergies    Patient has no known allergies.    Review of Systems   Review of Systems  Unable to perform ROS: Mental status change    Physical Exam Updated Vital Signs BP (!) 95/59   Pulse  68   Temp (!) 95 F (35 C) (Rectal)   Resp 12   Ht 6\' 1"  (1.854 m)   Wt 100 kg   SpO2 97%   BMI 29.09 kg/m  Physical Exam Vitals and nursing note reviewed.  Constitutional:      General: He is not in acute distress.    Appearance: He is well-developed. He is not toxic-appearing or diaphoretic.  HENT:     Head: Normocephalic and atraumatic.     Right Ear: External ear normal.     Left Ear: External ear normal.     Nose: Nose normal.     Mouth/Throat:     Mouth: Mucous membranes are moist.  Eyes:     Conjunctiva/sclera: Conjunctivae normal.     Pupils: Pupils are equal, round, and reactive to light.  Cardiovascular:     Rate and Rhythm: Normal rate and regular rhythm.     Heart sounds: No murmur heard. Pulmonary:      Effort: Pulmonary effort is normal. No respiratory distress.     Breath sounds: Normal breath sounds. No wheezing or rales.  Chest:     Chest wall: No tenderness.  Abdominal:     General: There is no distension.     Palpations: Abdomen is soft.     Tenderness: There is no abdominal tenderness.  Musculoskeletal:        General: No swelling or deformity.     Cervical back: Neck supple. No rigidity.  Skin:    General: Skin is warm and dry.     Coloration: Skin is not jaundiced or pale.  Neurological:     Mental Status: He is alert.     GCS: GCS eye subscore is 2. GCS verbal subscore is 2. GCS motor subscore is 6.     ED Results / Procedures / Treatments   Labs (all labs ordered are listed, but only abnormal results are displayed) Labs Reviewed  LACTIC ACID, PLASMA - Abnormal; Notable for the following components:      Result Value   Lactic Acid, Venous 5.5 (*)    All other components within normal limits  LACTIC ACID, PLASMA - Abnormal; Notable for the following components:   Lactic Acid, Venous 3.7 (*)    All other components within normal limits  COMPREHENSIVE METABOLIC PANEL - Abnormal; Notable for the following components:   CO2 20 (*)    Glucose, Bld 187 (*)    BUN 23 (*)    Total Protein 6.4 (*)    All other components within normal limits  CBC WITH DIFFERENTIAL/PLATELET - Abnormal; Notable for the following components:   RBC 3.74 (*)    Hemoglobin 11.9 (*)    HCT 36.5 (*)    RDW 16.5 (*)    Platelets 105 (*)    All other components within normal limits  PROTIME-INR - Abnormal; Notable for the following components:   Prothrombin Time 15.9 (*)    All other components within normal limits  URINALYSIS, W/ REFLEX TO CULTURE (INFECTION SUSPECTED) - Abnormal; Notable for the following components:   Ketones, ur 5 (*)    All other components within normal limits  SALICYLATE LEVEL - Abnormal; Notable for the following components:   Salicylate Lvl <7.0 (*)    All other  components within normal limits  ACETAMINOPHEN LEVEL - Abnormal; Notable for the following components:   Acetaminophen (Tylenol), Serum <10 (*)    All other components within normal limits  CBG MONITORING, ED - Abnormal;  Notable for the following components:   Glucose-Capillary 176 (*)    All other components within normal limits  I-STAT CHEM 8, ED - Abnormal; Notable for the following components:   BUN 24 (*)    Glucose, Bld 184 (*)    All other components within normal limits  I-STAT VENOUS BLOOD GAS, ED - Abnormal; Notable for the following components:   pH, Ven 7.211 (*)    pO2, Ven 68 (*)    Bicarbonate 19.7 (*)    TCO2 21 (*)    Acid-base deficit 8.0 (*)    All other components within normal limits  I-STAT VENOUS BLOOD GAS, ED - Abnormal; Notable for the following components:   pO2, Ven 116 (*)    Acid-base deficit 6.0 (*)    HCT 33.0 (*)    Hemoglobin 11.2 (*)    All other components within normal limits  RESP PANEL BY RT-PCR (RSV, FLU A&B, COVID)  [id number]  CULTURE, BLOOD (ROUTINE X 2)  CULTURE, BLOOD (ROUTINE X 2)  MAGNESIUM  AMMONIA  RAPID URINE DRUG SCREEN, HOSP PERFORMED  ETHANOL  TSH  VALPROIC ACID LEVEL  T4, FREE  CK  T3, FREE  TROPONIN I (HIGH SENSITIVITY)  TROPONIN I (HIGH SENSITIVITY)    EKG EKG Interpretation  Date/Time:  Wednesday August 16 2022 08:03:07 EDT Ventricular Rate:  68 PR Interval:  159 QRS Duration: 129 QT Interval:  457 QTC Calculation: 487 R Axis:   -1 Text Interpretation: Sinus rhythm Nonspecific intraventricular conduction delay Inferolateral infarct, age indeterminate Confirmed by Gloris Manchester [phone]) on 08/16/2022 9:15:47 AM  Radiology DG Chest Port 1 View  Result Date: 08/16/2022 CLINICAL DATA:  Found down.  Lethargic and slow to respond. EXAM: PORTABLE CHEST 1 VIEW COMPARISON:  Same-day CT chest. FINDINGS: The cardiomediastinal silhouette is normal. There is no focal consolidation or pulmonary edema. There is no pleural effusion or  pneumothorax There is no acute osseous abnormality. IMPRESSION: No radiographic evidence of acute cardiopulmonary process. Electronically Signed   By: Lesia Hausen M.D.   On: 08/16/2022 09:24   CT CHEST ABDOMEN PELVIS WO CONTRAST  Result Date: 08/16/2022 CLINICAL DATA:  Fall.  Intoxicated. EXAM: CT CHEST, ABDOMEN AND PELVIS WITHOUT CONTRAST TECHNIQUE: Multidetector CT imaging of the chest, abdomen and pelvis was performed following the standard protocol without IV contrast. RADIATION DOSE REDUCTION: This exam was performed according to the departmental dose-optimization program which includes automated exposure control, adjustment of the mA and/or kV according to patient size and/or use of iterative reconstruction technique. COMPARISON:  None Available. FINDINGS: CT CHEST FINDINGS Cardiovascular: No significant vascular findings. Normal heart size. No pericardial effusion. Mediastinum/Nodes: No enlarged mediastinal, hilar, or axillary lymph nodes. Thyroid gland, trachea, and esophagus demonstrate no significant findings. Lungs/Pleura: Mild bibasilar atelectasis. No focal consolidation, pleural effusion, or pneumothorax. Musculoskeletal: No acute or significant osseous findings. Multiple old healed left rib fractures. Right os acromiale. CT ABDOMEN PELVIS FINDINGS Hepatobiliary: No focal liver abnormality is seen. No gallstones, gallbladder wall thickening, or biliary dilatation. Pancreas: Unremarkable. No pancreatic ductal dilatation or surrounding inflammatory changes. Spleen: Normal in size without focal abnormality. Adrenals/Urinary Tract: The adrenal glands are unremarkable. Punctate calculus in the lower pole of the right kidney. No renal mass or hydronephrosis. Bladder is unremarkable. Stomach/Bowel: Stomach is within normal limits. Appendix appears normal. No evidence of bowel wall thickening, distention, or inflammatory changes. Vascular/Lymphatic: No significant vascular findings are present. No  enlarged abdominal or pelvic lymph nodes. Reproductive: Prostate is unremarkable. Other: No abdominal wall  hernia or abnormality. No abdominopelvic ascites. No pneumoperitoneum. Musculoskeletal: No acute osseous findings. Old healed right femoral neck fracture status post fixation. 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 IMPRESSION: 1. No acute traumatic injury within the chest, abdomen, or pelvis. 2. 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 3. Punctate nonobstructive right nephrolithiasis. Electronically Signed   By: Obie Dredge M.D.   On: 08/16/2022 09:15   CT Head Wo Contrast  Result Date: 08/16/2022 CLINICAL DATA:  Mental status change, unknown cause; Neck trauma, intoxicated or obtunded (Age >= 16y). EXAM: CT HEAD WITHOUT CONTRAST CT CERVICAL SPINE WITHOUT CONTRAST TECHNIQUE: Multidetector CT imaging of the head and cervical spine was performed following the standard protocol without intravenous contrast. Multiplanar CT image reconstructions of the cervical spine were also generated. RADIATION DOSE REDUCTION: This exam was performed according to the departmental dose-optimization program which includes automated exposure control, adjustment of the mA and/or kV according to patient size and/or use of iterative reconstruction technique. COMPARISON:  Head CT 12/26/2010. FINDINGS: CT HEAD FINDINGS Brain: No acute intracranial hemorrhage. Gray-white differentiation is preserved. No hydrocephalus or extra-axial collection. No mass effect or midline shift. Vascular: No hyperdense vessel or unexpected calcification. Skull: No calvarial fracture or suspicious bone lesion. Skull base is unremarkable. Sinuses/Orbits: Unremarkable. Other: None. CT CERVICAL SPINE FINDINGS Alignment: Normal. Skull base and vertebrae: No acute fracture. Normal craniocervical junction. No suspicious bone lesions. Soft tissues and spinal canal: No prevertebral  fluid or swelling. No visible canal hematoma. Disc levels:  No significant degenerative change. Upper chest: Unremarkable. Other: Atherosclerotic calcifications of the carotid bulbs. IMPRESSION: 1. No acute intracranial abnormality. 2. No acute fracture or traumatic malalignment of the cervical spine. Electronically Signed   By: Orvan Falconer M.D.   On: 08/16/2022 08:55   CT Cervical Spine Wo Contrast  Result Date: 08/16/2022 CLINICAL DATA:  Mental status change, unknown cause; Neck trauma, intoxicated or obtunded (Age >= 16y). EXAM: CT HEAD WITHOUT CONTRAST CT CERVICAL SPINE WITHOUT CONTRAST TECHNIQUE: Multidetector CT imaging of the head and cervical spine was performed following the standard protocol without intravenous contrast. Multiplanar CT image reconstructions of the cervical spine were also generated. RADIATION DOSE REDUCTION: This exam was performed according to the departmental dose-optimization program which includes automated exposure control, adjustment of the mA and/or kV according to patient size and/or use of iterative reconstruction technique. COMPARISON:  Head CT 12/26/2010. FINDINGS: CT HEAD FINDINGS Brain: No acute intracranial hemorrhage. Gray-white differentiation is preserved. No hydrocephalus or extra-axial collection. No mass effect or midline shift. Vascular: No hyperdense vessel or unexpected calcification. Skull: No calvarial fracture or suspicious bone lesion. Skull base is unremarkable. Sinuses/Orbits: Unremarkable. Other: None. CT CERVICAL SPINE FINDINGS Alignment: Normal. Skull base and vertebrae: No acute fracture. Normal craniocervical junction. No suspicious bone lesions. Soft tissues and spinal canal: No prevertebral fluid or swelling. No visible canal hematoma. Disc levels:  No significant degenerative change. Upper chest: Unremarkable. Other: Atherosclerotic calcifications of the carotid bulbs. IMPRESSION: 1. No acute intracranial abnormality. 2. No acute fracture or  traumatic malalignment of the cervical spine. Electronically Signed   By: Orvan Falconer M.D.   On: 08/16/2022 08:55    Procedures Procedures    Medications Ordered in ED Medications  metroNIDAZOLE (FLAGYL) IVPB 500 mg (500 mg Intravenous New Bag/Given 08/16/22 1154)  vancomycin (VANCOREADY) IVPB 2000 mg/400 mL (has no administration in time range)  vancomycin (VANCOREADY) IVPB 1500 mg/300 mL (has no administration in time range)  midazolam PF (VERSED) injection 1 mg (has no administration in time range)  cefTRIAXone (ROCEPHIN) 2 g in sodium chloride 0.9 % 100 mL IVPB (has no administration in time range)  sodium chloride 0.9 % bolus 1,000 mL (0 mLs Intravenous Stopped 08/16/22 0922)  sodium chloride 0.9 % bolus 1,000 mL (0 mLs Intravenous Stopped 08/16/22 1216)  ceFEPIme (MAXIPIME) 2 g in sodium chloride 0.9 % 100 mL IVPB (0 g Intravenous Stopped 08/16/22 1155)    ED Course/ Medical Decision Making/ A&P                             Medical Decision Making Amount and/or Complexity of Data Reviewed Labs: ordered. Radiology: ordered. ECG/medicine tests: ordered.  Risk Prescription drug management.   This patient presents to the ED for concern of altered mental status, this involves an extensive number of treatment options, and is a complaint that carries with it a high risk of complications and morbidity.  The differential diagnosis includes polypharmacy, seizure, CVA, ICH, intoxication, head trauma, metabolic derangements   Co morbidities that complicate the patient evaluation  autism, HTN, CML   Additional history obtained:  Additional history obtained from patient's mother, group home staff External records from outside source obtained and reviewed including EMR   Lab Tests:  I Ordered, and personally interpreted labs.  The pertinent results include: Lactic acidosis is elevated with otherwise normal serum lab results.   Imaging Studies ordered:  I ordered imaging  studies including chest x-ray, CT of head, cervical spine, chest, abdomen, pelvis I independently visualized and interpreted imaging which showed no acute findings I agree with the radiologist interpretation   Cardiac Monitoring: / EKG:  The patient was maintained on a cardiac monitor.  I personally viewed and interpreted the cardiac monitored which showed an underlying rhythm of: Sinus rhythm   Consultations Obtained:  I requested consultation with the neurologist, Dr. Iver Nestle,  and discussed lab and imaging findings as well as pertinent plan - they recommend: Routine EEG   Problem List / ED Course / Critical interventions / Medication management  Patient presents by EMS from group home for altered mental status.  He has autism. He is reportedly ambulatory and conversant, but confused at baseline.  On arrival, GCS is 10.  Blood pressures are low-normal.  Heart rate is normal.  He arrives on 2 L of supplemental oxygen by nasal cannula.  SpO2 on 2 L is 100%.  Patient does move all extremities.  He is able to follow commands.  CBG was in the range of 130.  On check of rectal temperature, core temperature was 90.6 degrees.  Bair hugger was placed.  Workup was initiated.  Lab results are notable for lactic acidosis.  This raises concern of possible seizure activity this morning.  After arrival in the ED, patient was accompanied at bedside by his mother.  Mother reports that he has not had any known seizure history.  Remaining lab work was unremarkable.  Patient underwent CT imaging of head, cervical spine, chest, abdomen, pelvis.  No acute findings were identified.  While in the ED, he had softening of blood pressures.  Additional IV fluids were ordered.  Patient was treated empirically for infection with broad-spectrum antibiotics.  On recheck of temperature, hypothermia improved to 95 degrees.  At this point, patient is alert and talking.  He remains somnolent.  Staff member from group home also  arrived in the ED to provide further  history.  Staff member confirms that he seemed normal last night.  I spoke with neurology, given concern of possible new onset seizure this morning.  Neurology evaluated at bedside.  For now, they recommend routine EEG.  Patient was admitted to medicine for further management. I ordered medication including IV fluids for hydration; broad-spectrum antibiotics for empiric treatment of sepsis given hypothermia and saw blood pressures Reevaluation of the patient after these medicines showed that the patient improved I have reviewed the patients home medicines and have made adjustments as needed   Social Determinants of Health:  Has autism and resides in group home  CRITICAL CARE Performed by: Gloris Manchester   Total critical care time: 35 minutes  Critical care time was exclusive of separately billable procedures and treating other patients.  Critical care was necessary to treat or prevent imminent or life-threatening deterioration.  Critical care was time spent personally by me on the following activities: development of treatment plan with patient and/or surrogate as well as nursing, discussions with consultants, evaluation of patient's response to treatment, examination of patient, obtaining history from patient or surrogate, ordering and performing treatments and interventions, ordering and review of laboratory studies, ordering and review of radiographic studies, pulse oximetry and re-evaluation of patient's condition.          Final Clinical Impression(s) / ED Diagnoses Final diagnoses:  Encephalopathy acute  Hypothermia, initial encounter  Lactic acidosis    Rx / DC Orders ED Discharge Orders     None         Gloris Manchester, MD 08/16/22 1245

## 2022-08-17 ENCOUNTER — Other Ambulatory Visit (HOSPITAL_COMMUNITY): Payer: Self-pay

## 2022-08-17 DIAGNOSIS — G40909 Epilepsy, unspecified, not intractable, without status epilepticus: Secondary | ICD-10-CM

## 2022-08-17 DIAGNOSIS — G9340 Encephalopathy, unspecified: Secondary | ICD-10-CM | POA: Diagnosis not present

## 2022-08-17 DIAGNOSIS — R569 Unspecified convulsions: Secondary | ICD-10-CM

## 2022-08-17 DIAGNOSIS — M87051 Idiopathic aseptic necrosis of right femur: Secondary | ICD-10-CM

## 2022-08-17 DIAGNOSIS — E872 Acidosis, unspecified: Secondary | ICD-10-CM

## 2022-08-17 DIAGNOSIS — C9210 Chronic myeloid leukemia, BCR/ABL-positive, not having achieved remission: Secondary | ICD-10-CM

## 2022-08-17 DIAGNOSIS — E039 Hypothyroidism, unspecified: Secondary | ICD-10-CM

## 2022-08-17 DIAGNOSIS — I10 Essential (primary) hypertension: Secondary | ICD-10-CM

## 2022-08-17 LAB — BASIC METABOLIC PANEL
Anion gap: 8 (ref 5–15)
BUN: 17 mg/dL (ref 6–20)
CO2: 20 mmol/L — ABNORMAL LOW (ref 22–32)
Calcium: 8.3 mg/dL — ABNORMAL LOW (ref 8.9–10.3)
Chloride: 110 mmol/L (ref 98–111)
Creatinine, Ser: 1.06 mg/dL (ref 0.61–1.24)
GFR, Estimated: >60 mL/min (ref >60)
Glucose, Bld: 91 mg/dL (ref 70–99)
Potassium: 4 mmol/L (ref 3.5–5.1)
Sodium: 138 mmol/L (ref 135–145)

## 2022-08-17 LAB — T3, FREE: T3, Free: 2.1 pg/mL (ref 2.0–4.4)

## 2022-08-17 LAB — CBC
HCT: 34.6 % — ABNORMAL LOW (ref 39.0–52.0)
Hemoglobin: 11.5 g/dL — ABNORMAL LOW (ref 13.0–17.0)
MCH: 31.7 pg (ref 26.0–34.0)
MCHC: 33.2 g/dL (ref 30.0–36.0)
MCV: 95.3 fL (ref 80.0–100.0)
Platelets: 107 10*3/uL — ABNORMAL LOW (ref 150–400)
RBC: 3.63 MIL/uL — ABNORMAL LOW (ref 4.22–5.81)
RDW: 16.1 % — ABNORMAL HIGH (ref 11.5–15.5)
WBC: 7.3 10*3/uL (ref 4.0–10.5)
nRBC: 0 % (ref 0.0–0.2)

## 2022-08-17 MED ORDER — POLYETHYLENE GLYCOL 3350 17 G PO PACK: 17.0000 g | PACK | Freq: Every day | ORAL | Status: DC

## 2022-08-17 MED ORDER — DIVALPROEX SODIUM 125 MG PO DR TAB
125.0000 mg | DELAYED_RELEASE_TABLET | Freq: Two times a day (BID) | ORAL | Status: DC
  Administered 2022-08-17: 125 mg via ORAL
  Filled 2022-08-17 (×2): qty 1

## 2022-08-17 MED ORDER — ZONISAMIDE 100 MG PO CAPS
300.0000 mg | ORAL_CAPSULE | Freq: Every day | ORAL | Status: DC
  Filled 2022-08-17: qty 3

## 2022-08-17 MED ORDER — ZONISAMIDE 100 MG PO CAPS: 300.0000 mg | ORAL_CAPSULE | Freq: Every day | ORAL | Status: AC

## 2022-08-17 MED ORDER — DIVALPROEX SODIUM 125 MG PO CSDR
125.0000 mg | DELAYED_RELEASE_CAPSULE | Freq: Two times a day (BID) | ORAL | 0 refills | Status: DC
  Filled 2022-08-17: qty 60, 30d supply, fill #0

## 2022-08-17 MED ORDER — IMATINIB MESYLATE 100 MG PO TABS
100.0000 mg | ORAL_TABLET | Freq: Two times a day (BID) | ORAL | Status: DC
  Administered 2022-08-17: 100 mg via ORAL
  Filled 2022-08-17 (×2): qty 1

## 2022-08-17 MED ORDER — ZONISAMIDE 100 MG PO CAPS
300.0000 mg | ORAL_CAPSULE | Freq: Once | ORAL | Status: DC
  Filled 2022-08-17: qty 3

## 2022-08-17 MED ORDER — IMATINIB MESYLATE 100 MG PO TABS
100.0000 mg | ORAL_TABLET | Freq: Two times a day (BID) | ORAL | Status: DC
Start: 2022-08-17 — End: 2023-11-05

## 2022-08-17 MED ORDER — DIVALPROEX SODIUM 500 MG PO DR TAB
500.0000 mg | DELAYED_RELEASE_TABLET | Freq: Two times a day (BID) | ORAL | Status: DC
  Administered 2022-08-17: 500 mg via ORAL
  Filled 2022-08-17 (×2): qty 1

## 2022-08-17 MED ORDER — DIVALPROEX SODIUM 500 MG PO DR TAB
500.0000 mg | DELAYED_RELEASE_TABLET | Freq: Two times a day (BID) | ORAL | 0 refills | Status: DC
  Filled 2022-08-17: qty 60, 30d supply, fill #0

## 2022-08-17 MED ORDER — METOPROLOL SUCCINATE ER 50 MG PO TB24
50.0000 mg | ORAL_TABLET | Freq: Every day | ORAL | Status: DC
  Administered 2022-08-17: 50 mg via ORAL
  Filled 2022-08-17: qty 1

## 2022-08-17 MED ORDER — SODIUM CHLORIDE 0.9 % IV SOLN: INTRAVENOUS | Status: DC | PRN

## 2022-08-17 MED ORDER — LEVOTHYROXINE SODIUM 100 MCG PO TABS
200.0000 ug | ORAL_TABLET | Freq: Every day | ORAL | Status: DC
  Administered 2022-08-17: 200 ug via ORAL
  Filled 2022-08-17: qty 2

## 2022-08-17 NOTE — Hospital Course (Signed)
***  Patient states doing ok. Mother at bedside. Still has right hip pain and follows with orthopedic surgeon. ***  *** Dr. Caryn Bee [name], Orthopedics *** *** Extra copy of DC summary for group home RN ***

## 2022-08-17 NOTE — Discharge Summary (Signed)
Name: Darrell Russo MRN: [id number] DOB: March 07, 1975 47 y.o. PCP: [name], [name]  Date of Admission: 08/16/2022  7:57 AM Date of Discharge: 08/17/2022 Attending Physician: Dr. Mercie Eon  Discharge Diagnosis: Principal Problem:   Acute encephalopathy Active Problems:   Leukemia [facility])   Essential hypertension   Autism   Mood disorder in conditions classified elsewhere   Hypothyroidism    Discharge Medications: Allergies as of 08/17/2022   No Known Allergies      Medication List     STOP taking these medications    divalproex 500 MG 24 hr tablet Commonly known as: DEPAKOTE ER Replaced by: divalproex 500 MG DR tablet       TAKE these medications    divalproex 500 MG DR tablet Commonly known as: DEPAKOTE Take 1 tablet (500 mg total) by mouth every 12 (twelve) hours. Replaces: divalproex 500 MG 24 hr tablet   divalproex 125 MG capsule Commonly known as: DEPAKOTE SPRINKLE Take 1 capsule (125 mg total) by mouth every 12 (twelve) hours.   doxepin 25 MG capsule Commonly known as: SINEQUAN Take 25 mg by mouth at bedtime as needed (Itching).   furosemide 20 MG tablet Commonly known as: LASIX Take 20 mg by mouth daily.   haloperidol 0.5 MG tablet Commonly known as: HALDOL Take 1.5 mg by mouth at bedtime.   hydrOXYzine 25 MG tablet Commonly known as: ATARAX Take 25 mg by mouth 3 (three) times daily.   imatinib 100 MG tablet Commonly known as: GLEEVEC Take 1 tablet (100 mg total) by mouth 2 (two) times daily.   levothyroxine 200 MCG tablet Commonly known as: SYNTHROID Take 200 mcg by mouth daily.   meloxicam 15 MG tablet Commonly known as: MOBIC Take 15 mg by mouth daily.   metoprolol succinate 50 MG 24 hr tablet Commonly known as: TOPROL-XL Take 50 mg by mouth daily.   miconazole 2 % powder Commonly known as: MICOTIN Apply 1 Application topically 2 (two) times daily.   minocycline 100 MG capsule Commonly known as: MINOCIN Take 100 mg  by mouth 2 (two) times daily.   nystatin cream Commonly known as: MYCOSTATIN Apply 1 application topically at bedtime.   polyethylene glycol 17 g packet Commonly known as: MIRALAX / GLYCOLAX Take 17 g by mouth daily.   triamcinolone ointment 0.1 % Commonly known as: KENALOG Apply 1 Application topically 2 (two) times daily.   vitamin D3 25 MCG tablet Commonly known as: CHOLECALCIFEROL Take 1,000 Units by mouth daily.   zonisamide 100 MG capsule Commonly known as: ZONEGRAN Take 3 capsules (300 mg total) by mouth at bedtime. What changed: when to take this        Disposition and follow-up:   Darrell Russo was discharged from [facility] in Stable condition.  At the hospital follow up visit please address:  1.  Follow-up:  a.  Acute encephalopathy: Likely secondary to seizures.  Patient discharged on increased dose of Depakote at 625 mg BID.  Patient also discharged with zonisamide 300 mg nightly.  Ensure patient has refills.  Ensure patient remains at baseline mental status.    b.  Avascular necrosis of right femoral head: Patient evaluated by PT during hospitalization and patient did well.  Patient did report some right hip pain.  Ensure patient has orthopedic follow-up outpatient.   c.  Hypertension: Patient on metoprolol at home.  Held during hospitalization given decreased blood pressures.  Patient discharged with instructions to continue metoprolol succinate 50  mg daily.  Continue to titrate medications outpatient.  2.  Labs / imaging needed at time of follow-up: CBC, BMP  3.  Pending labs/ test needing follow-up: N/A  4.  Medication Changes  Increase Depakote to 625 mg twice daily.  No other medication changes.  Follow-up Appointments:  Follow-up Information     [name], MD. Call in 1 day(s).   Specialty: Neurology Why: Make appointment for hospital follow up Contact information: [address] [place]  [address] [phone]         [name], [name]. Call today.   Specialty: Nurse Practitioner Why: To make appointment for hospital follow up Contact information: [address] [place] [address] [phone]         [name], [name], MD. Call today.   Specialty: Orthopedic Surgery Why: For appointment Contact information: [address] [place] [address] [phone]               Patient to follow-up with Dr. Teresa Coombs outpatient with [facility]. Patient to follow-up PCP in 1 week.  Hospital Course by problem list: This is a 47 year old male with past medical history of autism, hypertension, hypothyroidism, CML who presents to the emergency department concerns of altered mental status.  Patient found to have acute encephalopathy likely secondary to postictal state from seizure, and patient admitted for further evaluation management.  #Acute encephalopathy 2/2 postictal state #Seizure disorder Patient presented to the emergency department with altered mental status.  Patient was found to be unresponsive, and hypothermic initially.  Patient was brought to the emergency department, and workup with imaging was negative for any acute structural etiology.  Patient did not spike any fevers, and patient did not have any toxins that may have caused this.  Depakote level initially was therapeutic.  Neurology followed during hospitalization, also thinking this was more seizure related.  Did have some consideration for meningitis, but given lack of nuchal rigidity, no white count, and afebrile, patient did not have LP performed.  Patient had EEG that did not show any further subclinical seizures.  Patient responded well, and came back to baseline fairly quickly, which led to team thinking patient had seizure.  Patient worked with speech therapy and physical therapy and did well.  While in hospital, patient did not have any more seizures.   Patient's Depakote was increased.  Unclear etiology of why patient had seizures patient has been compliant medication, no other causes that could decrease patient's seizure threshold found.  Spoke with neurology, and they thought this could be related to autism, as patients with autism can develop seizures.  Patient discharged with increased dose of Depakote to 625 mg BID.  Patient also to continue zonisamide 300 mg nightly.  #Right femoral head avascular necrosis Incidental finding on imaging as patient has right femoral head avascular necrosis.  Patient did have surgery in the past by Dr. Jena Gauss.  Patient not have right hip pain during hospitalization.  Exam showed good movement of right hip during hospital stay.  Patient ambulated well with PT.  Patient to follow-up outpatient with orthopedics.  #Hypertension Initially on arrival, patient was hypotensive.  Home metoprolol succinate was held.  Patient did become tachycardic, likely rebound from holding beta-blocker.  Patient resumed metoprolol, and discharged on metoprolol 50 mg daily.  #Lactic acidosis During hospitalization patient had lactic acidosis, likely secondary to seizure.  Resolved after fluids.    #CML No acute concerns during hospitalization.  Patient resumed  imatinib during hospitalization.  #Hypothyroidism No acute concerns during hospitalization.  Patient resumed levothyroxine during hospitalization.   Discharge Subjective:  Patient evaluated bedside this morning.  Mom at bedside as well.  He has no concerns this morning.  He wants to eat.  He also reports he would like to go home.  Per mom, patient back to baseline.  Patient has no concerns this morning.  Discharge Exam:   BP 128/70   Pulse (!) 106   Temp 99.7 F (37.6 C) (Axillary)   Resp 20   Ht 6\' 1"  (1.854 m)   Wt 100 kg   SpO2 93%   BMI 29.09 kg/m  Constitutional: Resting in bed in no acute distress HENT: normocephalic atraumatic, mucous membranes  moist Cardiovascular: Tachycardic, regular rhythm, no murmurs, rubs, gallops Pulmonary/Chest: normal work of breathing on room air, lungs clear to auscultation bilaterally Abdominal: soft, non-tender, non-distended Neurological: No focal neurological deficits appreciated, able to move all extremities to command  Pertinent Labs, Studies, and Procedures:     Latest Ref Rng & Units 08/17/2022    2:14 AM 08/16/2022   11:33 AM 08/16/2022    8:29 AM  CBC  WBC 4.0 - 10.5 K/uL 7.3     Hemoglobin 13.0 - 17.0 g/dL 78.2  95.6  21.3    08.6   Hematocrit 39.0 - 52.0 % 34.6  33.0  39.0    40.0   Platelets 150 - 400 K/uL 107          Latest Ref Rng & Units 08/17/2022    2:14 AM 08/16/2022   11:33 AM 08/16/2022    8:29 AM  CMP  Glucose 70 - 99 mg/dL 91   578   BUN 6 - 20 mg/dL 17   24   Creatinine 4.69 - 1.24 mg/dL 6.29   5.28   Sodium 413 - 145 mmol/L 138  142  142    141   Potassium 3.5 - 5.1 mmol/L 4.0  4.1  3.6    3.6   Chloride 98 - 111 mmol/L 110   108   CO2 22 - 32 mmol/L 20     Calcium 8.9 - 10.3 mg/dL 8.3       EEG adult  Result Date: 08/16/2022 Charlsie Quest, MD     08/16/2022  4:42 PM Patient Name: Darrell Russo MRN: [id number] Epilepsy Attending: Charlsie Quest Referring Physician/Provider: Gloris Manchester, MD Date: 08/16/2022 Duration: 22.35 mins Patient history: 47yo M with breakthrough seizure getting eeg to evaluate for seizure. Level of alertness: Awake, asleep AEDs during EEG study: LCM, VPA Technical aspects: This EEG study was done with scalp electrodes positioned according to the 10-20 International system of electrode placement. Electrical activity was reviewed with band pass filter of 1-70Hz , sensitivity of 7 uV/mm, display speed of 41mm/sec with a 60Hz  notched filter applied as appropriate. EEG data were recorded continuously and digitally stored.  Video monitoring was available and reviewed as appropriate. Description: The posterior dominant rhythm consists of 8-9 Hz  activity of moderate voltage (25-35 uV) seen predominantly in posterior head regions, symmetric and reactive to eye opening and eye closing. Sleep was characterized by vertex waves, sleep spindles (12 to 14 Hz), maximal frontocentral region. EEG showed intermittent generalized 3 to 6 Hz theta-delta slowing.Photic driving was not seen during photic stimulation. Hyperventilation was not performed.   ABNORMALITY - Intermittent slow, generalized IMPRESSION: This study is suggestive of mild diffuse encephalopathy, nonspecific etiology. No seizures or epileptiform discharges were  seen throughout the recording. Charlsie Quest   MR BRAIN W WO CONTRAST  Result Date: 08/16/2022 CLINICAL DATA:  Mental status change, unknown cause EXAM: MRI HEAD WITHOUT AND WITH CONTRAST TECHNIQUE: Multiplanar, multiecho pulse sequences of the brain and surrounding structures were obtained without and with intravenous contrast. CONTRAST:  10mL GADAVIST GADOBUTROL 1 MMOL/ML IV SOLN COMPARISON:  Same day CT head FINDINGS: Brain: Negative for an acute infarct. No hemorrhage. No extra-axial fluid collection. No hydrocephalus. Sequela of mild overall chronic microvascular ischemic change. No abnormal contrast enhancement. Vascular: Normal flow voids. Skull and upper cervical spine: Normal marrow signal. Sinuses/Orbits: No middle ear or mastoid effusion. Mucosal thickening bilateral ethmoid sinuses. Other: None. IMPRESSION: No acute intracranial process. Electronically Signed   By: Lorenza Cambridge M.D.   On: 08/16/2022 14:26   DG Chest Port 1 View  Result Date: 08/16/2022 CLINICAL DATA:  Found down.  Lethargic and slow to respond. EXAM: PORTABLE CHEST 1 VIEW COMPARISON:  Same-day CT chest. FINDINGS: The cardiomediastinal silhouette is normal. There is no focal consolidation or pulmonary edema. There is no pleural effusion or pneumothorax There is no acute osseous abnormality. IMPRESSION: No radiographic evidence of acute cardiopulmonary  process. Electronically Signed   By: Lesia Hausen M.D.   On: 08/16/2022 09:24   CT CHEST ABDOMEN PELVIS WO CONTRAST  Result Date: 08/16/2022 CLINICAL DATA:  Fall.  Intoxicated. EXAM: CT CHEST, ABDOMEN AND PELVIS WITHOUT CONTRAST TECHNIQUE: Multidetector CT imaging of the chest, abdomen and pelvis was performed following the standard protocol without IV contrast. RADIATION DOSE REDUCTION: This exam was performed according to the departmental dose-optimization program which includes automated exposure control, adjustment of the mA and/or kV according to patient size and/or use of iterative reconstruction technique. COMPARISON:  None Available. FINDINGS: CT CHEST FINDINGS Cardiovascular: No significant vascular findings. Normal heart size. No pericardial effusion. Mediastinum/Nodes: No enlarged mediastinal, hilar, or axillary lymph nodes. Thyroid gland, trachea, and esophagus demonstrate no significant findings. Lungs/Pleura: Mild bibasilar atelectasis. No focal consolidation, pleural effusion, or pneumothorax. Musculoskeletal: No acute or significant osseous findings. Multiple old healed left rib fractures. Right os acromiale. CT ABDOMEN PELVIS FINDINGS Hepatobiliary: No focal liver abnormality is seen. No gallstones, gallbladder wall thickening, or biliary dilatation. Pancreas: Unremarkable. No pancreatic ductal dilatation or surrounding inflammatory changes. Spleen: Normal in size without focal abnormality. Adrenals/Urinary Tract: The adrenal glands are unremarkable. Punctate calculus in the lower pole of the right kidney. No renal mass or hydronephrosis. Bladder is unremarkable. Stomach/Bowel: Stomach is within normal limits. Appendix appears normal. No evidence of bowel wall thickening, distention, or inflammatory changes. Vascular/Lymphatic: No significant vascular findings are present. No enlarged abdominal or pelvic lymph nodes. Reproductive: Prostate is unremarkable. Other: No abdominal wall hernia or  abnormality. No abdominopelvic ascites. No pneumoperitoneum. Musculoskeletal: No acute osseous findings. Old healed right femoral neck fracture status post fixation. 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 IMPRESSION: 1. No acute traumatic injury within the chest, abdomen, or pelvis. 2. 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 3. Punctate nonobstructive right nephrolithiasis. Electronically Signed   By: Obie Dredge M.D.   On: 08/16/2022 09:15   CT Head Wo Contrast  Result Date: 08/16/2022 CLINICAL DATA:  Mental status change, unknown cause; Neck trauma, intoxicated or obtunded (Age >= 16y). EXAM: CT HEAD WITHOUT CONTRAST CT CERVICAL SPINE WITHOUT CONTRAST TECHNIQUE: Multidetector CT imaging of the head and cervical spine was performed following the standard protocol without intravenous contrast. Multiplanar CT image  reconstructions of the cervical spine were also generated. RADIATION DOSE REDUCTION: This exam was performed according to the departmental dose-optimization program which includes automated exposure control, adjustment of the mA and/or kV according to patient size and/or use of iterative reconstruction technique. COMPARISON:  Head CT 12/26/2010. FINDINGS: CT HEAD FINDINGS Brain: No acute intracranial hemorrhage. Gray-white differentiation is preserved. No hydrocephalus or extra-axial collection. No mass effect or midline shift. Vascular: No hyperdense vessel or unexpected calcification. Skull: No calvarial fracture or suspicious bone lesion. Skull base is unremarkable. Sinuses/Orbits: Unremarkable. Other: None. CT CERVICAL SPINE FINDINGS Alignment: Normal. Skull base and vertebrae: No acute fracture. Normal craniocervical junction. No suspicious bone lesions. Soft tissues and spinal canal: No prevertebral fluid or swelling. No visible canal hematoma. Disc levels:  No significant degenerative change. Upper chest:  Unremarkable. Other: Atherosclerotic calcifications of the carotid bulbs. IMPRESSION: 1. No acute intracranial abnormality. 2. No acute fracture or traumatic malalignment of the cervical spine. Electronically Signed   By: Orvan Falconer M.D.   On: 08/16/2022 08:55   CT Cervical Spine Wo Contrast  Result Date: 08/16/2022 CLINICAL DATA:  Mental status change, unknown cause; Neck trauma, intoxicated or obtunded (Age >= 16y). EXAM: CT HEAD WITHOUT CONTRAST CT CERVICAL SPINE WITHOUT CONTRAST TECHNIQUE: Multidetector CT imaging of the head and cervical spine was performed following the standard protocol without intravenous contrast. Multiplanar CT image reconstructions of the cervical spine were also generated. RADIATION DOSE REDUCTION: This exam was performed according to the departmental dose-optimization program which includes automated exposure control, adjustment of the mA and/or kV according to patient size and/or use of iterative reconstruction technique. COMPARISON:  Head CT 12/26/2010. FINDINGS: CT HEAD FINDINGS Brain: No acute intracranial hemorrhage. Gray-white differentiation is preserved. No hydrocephalus or extra-axial collection. No mass effect or midline shift. Vascular: No hyperdense vessel or unexpected calcification. Skull: No calvarial fracture or suspicious bone lesion. Skull base is unremarkable. Sinuses/Orbits: Unremarkable. Other: None. CT CERVICAL SPINE FINDINGS Alignment: Normal. Skull base and vertebrae: No acute fracture. Normal craniocervical junction. No suspicious bone lesions. Soft tissues and spinal canal: No prevertebral fluid or swelling. No visible canal hematoma. Disc levels:  No significant degenerative change. Upper chest: Unremarkable. Other: Atherosclerotic calcifications of the carotid bulbs. IMPRESSION: 1. No acute intracranial abnormality. 2. No acute fracture or traumatic malalignment of the cervical spine. Electronically Signed   By: Orvan Falconer M.D.   On: 08/16/2022  08:55     Discharge Instructions: Discharge Instructions     Ambulatory referral to Neurology   Complete by: As directed    An appointment is requested in approximately: 4 weeks   Call MD for:  difficulty breathing, headache or visual disturbances   Complete by: As directed    Call MD for:  persistant dizziness or light-headedness   Complete by: As directed    Call MD for:  persistant nausea and vomiting   Complete by: As directed    Call MD for:  severe uncontrolled pain   Complete by: As directed    Call MD for:  temperature >100.4   Complete by: As directed    Diet - low sodium heart healthy   Complete by: As directed    Discharge instructions   Complete by: As directed    Darrell Russo  It was a pleasure taking care of you at [facility]. You were admitted for seizures, and treated with antiseizure medications. We are discharging you home now that you are doing better. Please follow the following instructions.  1) Regarding seizures, we are increasing your medication.  We have changed some medications.  We have increased your Depakote to  625 mg twice daily.  Continue taking zonisamide 300 mg nightly.  As you did have a seizure, please do not drive for the next 6 months.  Please have close supervision when taking a bath or cooking.  Please avoid swimming, but if you do swim please have close supervision as well.  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 Jena Gauss   3) For your neurology follow-up, please contact Dr. Teresa Coombs for follow-up with [facility].  4) Please follow-up with your primary care doctor in about 1 week for hospital follow-up.  5) If you do have any further seizures, please return back to the emergency department  Take care,  Dr. Modena Slater, DO   Increase activity slowly   Complete by: As directed        Signed: Modena Slater,  DO 08/17/2022, 2:02 PM   Pager: [phone]

## 2022-08-17 NOTE — Evaluation (Signed)
Clinical/Bedside Swallow Evaluation Patient Details  Name: Darrell Russo MRN: [id number] Date of Birth: February 06, 1976  Today's Date: 08/17/2022 Time: SLP Start Time (ACUTE ONLY): 0910 SLP Stop Time (ACUTE ONLY): 0917 SLP Time Calculation (min) (ACUTE ONLY): 7 min  Past Medical History:  Past Medical History:  Diagnosis Date   Autism    Folliculitis    Hypertension    Impulse control disorder    Leukemia (HCC)    TMJ (dislocation of temporomandibular joint)    Past Surgical History:  Past Surgical History:  Procedure Laterality Date   ANKLE SURGERY     l/ankle fracture 5 years ago   ORIF HIP FRACTURE Right 12/13/2016   Procedure: ORIF of Right Femoral neck fracture;  Surgeon: Roby Lofts, MD;  Location: [facility] OR;  Service: Orthopedics;  Laterality: Right;   HPI:  Darrell Russo is a 47 year old male living with autism, hypertension, hypothyroidism, CML, mood disorder, presented to the emergency room for acute encephalopathy after being found down, foaming from his mouth. In ED, patient was minimally responsive,  hypothermic and hypotensive and sepsis protocol was initiated.    Assessment / Plan / Recommendation  Clinical Impression  Pt alert, pleasant with mom at bedside. Facia/lingual ROM and dentition are intact. Mom states he ate his breakfast and hers without difficulty and denies history of dysphagia. He is impulsive with self feeding at an accelerated rate. Oral and pharyngeal swallow appeared within normal limits. No s/s aspiration with consecutive straw sips thin and complete oral clearance with solid. Recommend continue regular texture/thin liquids, pills as he normally takes them. No follow up needed. SLP Visit Diagnosis: Dysphagia, unspecified (R13.10)    Aspiration Risk  No limitations    Diet Recommendation Regular;Thin liquid   Liquid Administration via: Straw Medication Administration: Whole meds with liquid Supervision: Patient able to self feed Compensations:  Slow rate;Small sips/bites Postural Changes: Seated upright at 90 degrees    Other  Recommendations Oral Care Recommendations: Oral care BID    Recommendations for follow up therapy are one component of a multi-disciplinary discharge planning process, led by the attending physician.  Recommendations may be updated based on patient status, additional functional criteria and insurance authorization.  Follow up Recommendations No SLP follow up      Assistance Recommended at Discharge    Functional Status Assessment Patient has not had a recent decline in their functional status  Frequency and Duration            Prognosis        Swallow Study   General Date of Onset: 08/16/22 HPI: Darrell Russo is a 47 year old male living with autism, hypertension, hypothyroidism, CML, mood disorder, presented to the emergency room for acute encephalopathy after being found down, foaming from his mouth. In ED, patient was minimally responsive,  hypothermic and hypotensive and sepsis protocol was initiated. Type of Study: Bedside Swallow Evaluation Previous Swallow Assessment:  (none) Diet Prior to this Study: Regular;Thin liquids (Level 0) Temperature Spikes Noted: No Respiratory Status: Room air History of Recent Intubation: No Behavior/Cognition: Alert;Cooperative;Pleasant mood;Impulsive Oral Cavity Assessment: Within Functional Limits Oral Care Completed by SLP: No Oral Cavity - Dentition: Adequate natural dentition Vision: Functional for self-feeding Self-Feeding Abilities: Able to feed self Patient Positioning: Upright in bed Baseline Vocal Quality: Normal Volitional Cough: Strong Volitional Swallow: Able to elicit    Oral/Motor/Sensory Function Overall Oral Motor/Sensory Function: Within functional limits   Ice Chips Ice chips: Not tested   Thin Liquid Thin Liquid: Within functional limits  Presentation: Straw    Nectar Thick Nectar Thick Liquid: Not tested   Honey Thick Honey Thick  Liquid: Not tested   Puree Puree: Within functional limits   Solid     Solid: Within functional limits      Darrell Russo 08/17/2022,9:28 AM

## 2022-08-17 NOTE — TOC Transition Note (Addendum)
Transition of Care [facility]) - CM/SW Discharge Note   Patient Details  Name: Darrell Russo MRN: [id number] Date of Birth: 08-04-1975  Transition of Care [facility]) CM/SW Contact:  Delilah Shan, LCSWA Phone Number: 08/17/2022, 3:15 PM   Clinical Narrative:     Patient will DC to: Group Home  Anticipated DC date: 08/17/2022  Family notified: [name]  Transport bySelena Batten with Group Home  ?  Per MD patient ready for DC to Group home . RN, patient, patient's family, and facility notified of DC. Discharge Summary sent to facility. RN given number for report [phone]. DC packet on chart. Kim with Group home will transport patient to Group home.  CSW signing off.     Barriers to Discharge: Continued Medical Work up   Patient Goals and CMS Choice   Choice offered to / list presented to : Parent (Patients mother)  Discharge Placement                         Discharge Plan and Services Additional resources added to the After Visit Summary for   In-house Referral: Clinical Social Work                                   Social Determinants of Health (SDOH) Interventions SDOH Screenings   Tobacco Use: Low Risk  (08/16/2022)     Readmission Risk Interventions     No data to display

## 2022-08-17 NOTE — Progress Notes (Signed)
Report called to Rudy Jew, RN

## 2022-08-17 NOTE — TOC Initial Note (Addendum)
Transition of Care [facility]) - Initial/Assessment Note    Patient Details  Name: Darrell Russo MRN: [id number] Date of Birth: [birth year]  Transition of Care [facility]) CM/SW Contact:    Delilah Shan, LCSWA Phone Number: 08/17/2022, 1:02 PM  Clinical Narrative:                   CSW spoke with patient and patients mother at bedside. Patients mother reports PTA patient comes from [facility]. Patients mother reports plan is for patient to return back to Group Home when patient is medically ready for dc.Patients mother reports contact at Group Home is Selena Batten [phone] RN tele# [phone]. All questions answered. No further questions reported at this time. CSW LVM for Kim with Group Home. CSW awaiting call back. CSW will continue to follow and assist with patients dc planning needs.  CSW spoke with RN at group home [facility] [phone]. Annice Pih reports Group Home will need DC summary when patient is medically ready. Annice Pih informed CSW no FL2 needed. Fax # [phone] ATT to: Annice Pih. Please call Kim with Group Home for pick up when patient medically ready for dc [phone]. Annice Pih informed CSW meds will go to [facility] in [place]. CSW will continue to follow.  Expected Discharge Plan: Group Home Barriers to Discharge: Continued Medical Work up   Patient Goals and CMS Choice     Choice offered to / list presented to : Parent (Patients mother)      Expected Discharge Plan and Services In-house Referral: Clinical Social Work     Living arrangements for the past 2 months: Group Home                                      Prior Living Arrangements/Services Living arrangements for the past 2 months: Group Home Lives with::  (Lives at group home) Patient language and need for interpreter reviewed:: Yes Do you feel safe going back to the place where you live?: Yes      Need for Family Participation in Patient Care: Yes (Comment) Care giver  support system in place?: Yes (comment)   Criminal Activity/Legal Involvement Pertinent to Current Situation/Hospitalization: No - Comment as needed  Activities of Daily Living      Permission Sought/Granted Permission sought to share information with : Case Manager, Family Supports, Magazine features editor Permission granted to share information with : Yes, Verbal Permission Granted  Share Information with NAME: [name]  Permission granted to share info w AGENCY: SNF  Permission granted to share info w Relationship: mother  Permission granted to share info w Contact Information: [name] [phone]  Emotional Assessment Appearance:: Appears stated age Attitude/Demeanor/Rapport: Gracious Affect (typically observed): Calm Orientation: : Oriented to Self, Oriented to Place, Oriented to  Time Alcohol / Substance Use: Not Applicable Psych Involvement: No (comment)  Admission diagnosis:  Lactic acidosis [E87.20] Encephalopathy acute [G93.40] Acute encephalopathy [G93.40] Hypothermia, initial encounter [T68.XXXA] Patient Active Problem List   Diagnosis Date Noted   Acute encephalopathy 08/16/2022   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 [place] [address] Phone: [phone] Fax: [phone]     Social Determinants of Health (SDOH) Social History: SDOH Screenings   Tobacco Use: Low Risk  (08/16/2022)   SDOH Interventions:     Readmission Risk Interventions     No data to display

## 2022-08-17 NOTE — Discharge Instructions (Addendum)
Mr. Darrell Russo  It was a pleasure taking care of you at [facility]. You were admitted for seizures, and treated with antiseizure medications. We are discharging you home now that you are doing better. Please follow the following instructions.   1) Regarding seizures, we are increasing your medication.  We have changed some medications.  We have increased your Depakote to  625 mg twice daily.  Continue taking zonisamide 300 mg nightly.  As you did have a seizure, please do not drive for the next 6 months.  Please have close supervision when taking a bath or cooking.  Please avoid swimming, but if you do swim please have close supervision as well.  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 Jena Gauss   3) For your neurology follow-up, please contact Dr. Teresa Coombs for follow-up with [facility].  4) Please follow-up with your primary care doctor in about 1 week for hospital follow-up.  5) If you do have any further seizures, please return back to the emergency department  Take care,  Dr. Modena Slater, DO

## 2022-08-17 NOTE — Evaluation (Signed)
Physical Therapy Evaluation Patient Details Name: Darrell Russo MRN: [id number] DOB: 13-Aug-1975 Today's Date: 08/17/2022  History of Present Illness  Pt is a 47 y/o male admitted secondary to acute encephalopathy, suspected due to a seizure. PMH including but not limited to Autism, hypertension, hypothyroidism, CML, mood disorder.   Clinical Impression  Pt presented supine in bed with HOB elevated, awake and willing to participate in therapy session. Pt's mother (and legal guardian) present throughout evaluation. Prior to admission, pt was independent with all functional mobility and ADLs. Pt lives in a group home with several other men that have all been together since they were in school, per mother's report. At the time of evaluation, pt overall moving very well at an independent to supervision level with all functional mobility, including hallway ambulation without use of an AD. Of note, pt's HR increasing to as high as 157 bpm during ambulation (in the 130's at rest). Per mother's reports, pt is off of one of his cardiac medications but will start taking it again once he returns to the group home. Pt's mother reporting that she feels pt is at his baseline in regards to his mobility. No further acute PT needs identified at this time. PT signing off.        Recommendations for follow up therapy are one component of a multi-disciplinary discharge planning process, led by the attending physician.  Recommendations may be updated based on patient status, additional functional criteria and insurance authorization.  Follow Up Recommendations       Assistance Recommended at Discharge PRN  Patient can return home with the following  Assist for transportation    Equipment Recommendations None recommended by PT  Recommendations for Other Services       Functional Status Assessment Patient has not had a recent decline in their functional status     Precautions / Restrictions  Precautions Precautions: Fall Precaution Comments: monitor HR Restrictions Weight Bearing Restrictions: No      Mobility  Bed Mobility Overal bed mobility: Independent                  Transfers Overall transfer level: Independent Equipment used: None                    Ambulation/Gait Ambulation/Gait assistance: Supervision Gait Distance (Feet): 200 Feet Assistive device: None Gait Pattern/deviations: Step-through pattern Gait velocity: WNL     General Gait Details: pt with a normal gait speed, no instability or LOB; able to complete sudden stops/starts, turns and directional changes without LOB or need for physical assist  Stairs            Wheelchair Mobility    Modified Rankin (Stroke Patients Only)       Balance Overall balance assessment: Needs assistance Sitting-balance support: Feet supported, No upper extremity supported Sitting balance-Leahy Scale: Normal     Standing balance support: During functional activity, No upper extremity supported Standing balance-Leahy Scale: Good                               Pertinent Vitals/Pain Pain Assessment Pain Assessment: No/denies pain    Home Living Family/patient expects to be discharged to:: Group home                   Additional Comments: 6 steps to enter with rails, pt does not drive    Prior Function Prior Level of Function :  Independent/Modified Independent                     Hand Dominance        Extremity/Trunk Assessment   Upper Extremity Assessment Upper Extremity Assessment: Overall WFL for tasks assessed    Lower Extremity Assessment Lower Extremity Assessment: Overall WFL for tasks assessed    Cervical / Trunk Assessment Cervical / Trunk Assessment: Normal  Communication   Communication: Expressive difficulties  Cognition Arousal/Alertness: Awake/alert Behavior During Therapy: Restless Overall Cognitive Status: History of  cognitive impairments - at baseline                                 General Comments: pt with Autism diagnosis; per mother, pt is at his baseline        General Comments      Exercises     Assessment/Plan    PT Assessment Patient does not need any further PT services  PT Problem List         PT Treatment Interventions      PT Goals (Current goals can be found in the Care Plan section)  Acute Rehab PT Goals Patient Stated Goal: to eat [employer] for lunch PT Goal Formulation: All assessment and education complete, DC therapy    Frequency       Co-evaluation               AM-PAC PT "6 Clicks" Mobility  Outcome Measure Help needed turning from your back to your side while in a flat bed without using bedrails?: None Help needed moving from lying on your back to sitting on the side of a flat bed without using bedrails?: None Help needed moving to and from a bed to a chair (including a wheelchair)?: None Help needed standing up from a chair using your arms (e.g., wheelchair or bedside chair)?: None Help needed to walk in hospital room?: None Help needed climbing 3-5 steps with a railing? : A Little 6 Click Score: 23    End of Session   Activity Tolerance: Patient tolerated treatment well Patient left: in bed;with call bell/phone within reach;with family/visitor present Nurse Communication: Mobility status PT Visit Diagnosis: Other abnormalities of gait and mobility (R26.89)    Time: 2956-2130 PT Time Calculation (min) (ACUTE ONLY): 17 min   Charges:   PT Evaluation $PT Eval Low Complexity: 1 Low          Ginette Pitman, PT, DPT  Acute Rehabilitation Services Office [phone]   Alessandra Bevels Porscha Axley 08/17/2022, 10:16 AM

## 2022-08-17 NOTE — Care Management Obs Status (Signed)
MEDICARE OBSERVATION STATUS NOTIFICATION   Patient Details  Name: DARMON MOZLEY MRN: [id number] Date of Birth: 03/05/1975   Medicare Observation Status Notification Given:  Yes    Gala Lewandowsky, RN 08/17/2022, 1:17 PM

## 2022-08-17 NOTE — Progress Notes (Signed)
Neurology Progress Note  Patient ID: Darrell Russo is a 47 y.o. with PMHx of  has a past medical history of Autism, Folliculitis, Hypertension, Impulse control disorder, Leukemia (HCC), and TMJ (dislocation of temporomandibular joint).   Subjective: - Smiling, no complaints  Exam: Vitals:   08/17/22 0011 08/17/22 0345  BP: 103/66 (!) 90/55  Pulse: 88   Resp: 19 20  Temp: 97.9 F (36.6 C) 99.7 F (37.6 C)  SpO2: 99% 96%   Gen: In bed, comfortable  Resp: non-labored breathing, no grossly audible wheezing Cardiac: Perfusing extremities well  Abd: soft, nt  Neuro: MS: Interactive, follows simple commands,  CN: Face symmetric, tracks examiner well  Motor: High five intact bilaterally. Antalgic gait, possible mild right hemiparesis  Pertinent Data:  Basic Metabolic Panel: Recent Labs  Lab 08/16/22 0820 08/16/22 0829 08/16/22 1133 08/17/22 0214  NA 139 142  141 142 138  K 3.5 3.6  3.6 4.1 4.0  CL 107 108  --  110  CO2 20*  --   --  20*  GLUCOSE 187* 184*  --  91  BUN 23* 24*  --  17  CREATININE 1.15 1.00  --  1.06  CALCIUM 9.1  --   --  8.3*  MG 2.0  --   --   --     CBC: Recent Labs  Lab 08/16/22 0820 08/16/22 0829 08/16/22 1133 08/17/22 0214  WBC 4.7  --   --  7.3  NEUTROABS 3.5  --   --   --   HGB 11.9* 13.3  13.6 11.2* 11.5*  HCT 36.5* 39.0  40.0 33.0* 34.6*  MCV 97.6  --   --  95.3  PLT 105*  --   --  107*    Coagulation Studies: Recent Labs    08/16/22 0820  LABPROT 15.9*  INR 1.2     Latest Reference Range & Units 08/16/22 08:20 08/16/22 10:18 08/16/22 16:35 08/16/22 19:11  Lactic Acid, Venous 0.5 - 1.9 mmol/L 5.5 (HH) 3.7 (HH) 1.5 1.6  (HH): Data is critically high   MRI brain personally reviewed, agree with radiology, no acute intracranial process   Impression: Seizure in the setting of autism now back at baseline but right hip pain  Recommendations: - Workup and management of  hip pain per primary team - Transition to 625 mg  oral Depakote BID (increased from home dose)  - Continue zonisamide 300 mg at bedtime - Include seizure precautions in discharge instructions - Outpatient referral to Dr. Teresa Coombs at [facility] - Neurology will be available as needed, recs conveyed to primary via secure chat  Standard seizure precautions: Per [facility] statutes, patients with seizures are not allowed to drive until  they have been seizure-free for six months. Use caution when using heavy equipment or power tools. Avoid working on ladders or at heights. Take showers instead of baths. Ensure the water temperature is not too high on the home water heater. Do not go swimming alone. When caring for infants or small children, sit down when holding, feeding, or changing them to minimize risk of injury to the child in the event you have a seizure.  To reduce risk of seizures, maintain good sleep hygiene avoid alcohol and illicit drug use, take all anti-seizure medications as prescribed.    Brooke Dare MD-PhD [facility] [phone]  Available 7 AM to 7 PM, outside these hours please contact Neurologist on call listed on AMION   > 35 min  spent in care of patient, majority at bedside

## 2022-08-21 LAB — CULTURE, BLOOD (ROUTINE X 2)
Culture: NO GROWTH
Culture: NO GROWTH
Special Requests: ADEQUATE
Special Requests: ADEQUATE

## 2022-09-25 ENCOUNTER — Telehealth: Payer: Self-pay | Admitting: Physician Assistant

## 2022-10-27 ENCOUNTER — Ambulatory Visit: Payer: Medicare Other | Admitting: Neurology

## 2022-10-27 ENCOUNTER — Encounter: Payer: Self-pay | Admitting: Neurology

## 2022-10-27 ENCOUNTER — Telehealth: Payer: Self-pay

## 2022-10-27 VITALS — BP 119/76 | HR 62 | Ht 73.0 in | Wt 228.0 lb

## 2022-10-27 DIAGNOSIS — Z5181 Encounter for therapeutic drug level monitoring: Secondary | ICD-10-CM

## 2022-10-27 DIAGNOSIS — R569 Unspecified convulsions: Secondary | ICD-10-CM | POA: Diagnosis not present

## 2022-10-27 DIAGNOSIS — F840 Autistic disorder: Secondary | ICD-10-CM | POA: Diagnosis not present

## 2022-10-27 MED ORDER — DIVALPROEX SODIUM 125 MG PO CSDR: 125.0000 mg | DELAYED_RELEASE_CAPSULE | Freq: Two times a day (BID) | ORAL | 3 refills | Status: DC

## 2022-10-27 MED ORDER — DIVALPROEX SODIUM 500 MG PO DR TAB: 500.0000 mg | DELAYED_RELEASE_TABLET | Freq: Two times a day (BID) | ORAL | 3 refills | Status: DC

## 2022-10-27 MED ORDER — DIVALPROEX SODIUM ER 500 MG PO TB24: 500.0000 mg | ORAL_TABLET | Freq: Two times a day (BID) | ORAL | 3 refills | Status: DC

## 2022-10-27 NOTE — Patient Instructions (Addendum)
Continue current medications Depakote 625 mg twice daily  Zonisamide 300 mg nightly , refill was Depakote given  Will check Depakote level and LFTs  Continue to follow up with PCP  Return in a year

## 2022-10-27 NOTE — Telephone Encounter (Signed)
Call from [facility] to discuss Depakote Er vs Depakote DR. Per Dr. Teresa Coombs, will cancel script sent in for Depakote ER and resend for Depakote DR.

## 2022-10-27 NOTE — Progress Notes (Signed)
[facility]  PATIENT: Darrell Russo DOB: [birth year]  REQUESTING CLINICIAN: Mercie Eon, MD HISTORY FROM: Ortencia Kick caregiver  REASON FOR VISIT: Seizure    HISTORICAL  CHIEF COMPLAINT:  Chief Complaint  Patient presents with   New Patient (Initial Visit)    Pt in room 13, Byron in room with patient, health services coordinator. New patient here for seizures. Pt last seizure was 08/15/22 first one byron experienced in 10 years with patient.     HISTORY OF PRESENT ILLNESS:  This is a 47 year old man with past medical history of autism, intellectual disability, hypertension, seizure disorder who is presenting after a possible seizure in June.  Patient lives in a group home, per group home caregiver, Ortencia Kick they have not seen a seizure in the past 10 years.  On June 18 he was found on the bathroom floor, lethargic and sleepy.  He was taken to the hospital.  His prolactin level was elevated.  His EEG showed intermittent slowing.  It was thought that patient did have a breakthrough seizure, his Depakote was increased from 500 mg twice daily to 625 mg twice daily.  Since then, he has been back to his normal self, compliant with the medications.  No other complaints, no other concerns per parent.   Handedness: Right   Onset: Unknown   Seizure Type: Unknown, he was found down   Current frequency: 6/18, previous seizure was more than 10 years ago   Any injuries from seizures: Denies   Seizure risk factors: Autism   Previous ASMs: Valproic acid, Zonisamide  Currenty ASMs: Valproic acid 625 mg BID, Zonisamide 300 mg nightly   ASMs side effects: Denies   Brain Images: No acute abnormality   Previous EEGs: Intermittent slowing    OTHER MEDICAL CONDITIONS: Autism, Intellectual disability, Seizures, Hypertension   REVIEW OF SYSTEMS: Full 14 system review of systems performed and negative with exception of: Unable to fully obtain   ALLERGIES: No Known  Allergies  HOME MEDICATIONS: Outpatient Medications Prior to Visit  Medication Sig Dispense Refill   doxepin (SINEQUAN) 25 MG capsule Take 25 mg by mouth at bedtime as needed (Itching).     Emollient (CERAVE MOISTURIZING EX) Apply topically. TID AS NEEDED     Emollient (CETAPHIL) cream Apply 1 Application topically 2 (two) times daily.     furosemide (LASIX) 20 MG tablet Take 20 mg by mouth daily.     haloperidol (HALDOL) 0.5 MG tablet Take 1.5 mg by mouth at bedtime.     hydrOXYzine (ATARAX) 25 MG tablet Take 25 mg by mouth 3 (three) times daily.     imatinib (GLEEVEC) 100 MG tablet Take 1 tablet (100 mg total) by mouth 2 (two) times daily.     levothyroxine (SYNTHROID) 200 MCG tablet Take 200 mcg by mouth daily.     meloxicam (MOBIC) 15 MG tablet Take 15 mg by mouth daily.     metoprolol (TOPROL-XL) 50 MG 24 hr tablet Take 50 mg by mouth daily.       miconazole (MICOTIN) 2 % powder Apply 1 Application topically 2 (two) times daily.     minocycline (MINOCIN,DYNACIN) 100 MG capsule Take 100 mg by mouth 2 (two) times daily.       nystatin cream (MYCOSTATIN) Apply 1 application topically at bedtime.     polyethylene glycol (MIRALAX / GLYCOLAX) 17 g packet Take 17 g by mouth daily.     senna (SENOKOT) 8.6 MG TABS tablet Take 1 tablet by mouth daily  as needed for mild constipation.     triamcinolone ointment (KENALOG) 0.1 % Apply 1 Application topically 2 (two) times daily.     vitamin D3 (CHOLECALCIFEROL) 25 MCG tablet Take 1,000 Units by mouth daily.     zonisamide (ZONEGRAN) 100 MG capsule Take 3 capsules (300 mg total) by mouth at bedtime.     divalproex (DEPAKOTE ER) 500 MG 24 hr tablet Take 500 mg by mouth in the morning and at bedtime.     divalproex (DEPAKOTE SPRINKLE) 125 MG capsule Take 1 capsule (125 mg total) by mouth every 12 (twelve) hours. 60 capsule 0   divalproex (DEPAKOTE) 500 MG DR tablet Take 1 tablet (500 mg total) by mouth every 12 (twelve) hours. (Patient not taking:  Reported on 10/27/2022) 60 tablet 0   No facility-administered medications prior to visit.    PAST MEDICAL HISTORY: Past Medical History:  Diagnosis Date   Autism    Folliculitis    Hypertension    Impulse control disorder    Leukemia (HCC)    TMJ (dislocation of temporomandibular joint)     PAST SURGICAL HISTORY: Past Surgical History:  Procedure Laterality Date   ANKLE SURGERY     l/ankle fracture 5 years ago   ORIF HIP FRACTURE Right 12/13/2016   Procedure: ORIF of Right Femoral neck fracture;  Surgeon: Roby Lofts, MD;  Location: [facility] OR;  Service: Orthopedics;  Laterality: Right;    FAMILY HISTORY: History reviewed. No pertinent family history.  SOCIAL HISTORY: Social History   Socioeconomic History   Marital status: Single    Spouse name: Not on file   Number of children: Not on file   Years of education: Not on file   Highest education level: Not on file  Occupational History   Not on file  Tobacco Use   Smoking status: Never   Smokeless tobacco: Never  Substance and Sexual Activity   Alcohol use: No   Drug use: No   Sexual activity: Not on file  Other Topics Concern   Not on file  Social History Narrative   Rare coffee or soda   Social Determinants of Health   Financial Resource Strain: Low Risk  (02/03/2022)   Received from [facility], [facility]   Overall Financial Resource Strain (CARDIA)    Difficulty of Paying Living Expenses: Not hard at all  Food Insecurity: No Food Insecurity (02/03/2022)   Received from [facility], [facility]   Hunger Vital Sign    Worried About Running Out of Food in the Last Year: Never true    Ran Out of Food in the Last Year: Never true  Transportation Needs: Not on file  Physical Activity: Insufficiently Active (02/03/2022)   Received from [facility], [facility]   Exercise Vital Sign    Days of Exercise per Week: 5 days    Minutes of Exercise per Session: 20 min  Stress: No Stress Concern  Present (02/03/2022)   Received from [facility], [facility] - Occupational Stress Questionnaire    Feeling of Stress : Not at all  Social Connections: Socially Integrated (02/03/2022)   Received from [facility], [facility]   Social Network    How would you rate your social network (family, work, friends)?: Good participation with social networks  Intimate Partner Violence: Not At Risk (02/03/2022)   Received from [facility], [facility]   HITS    Over the last 12 months how often  did your partner physically hurt you?: 1    Over the last 12 months how often did your partner insult you or talk down to you?: 1    Over the last 12 months how often did your partner threaten you with physical harm?: 1    Over the last 12 months how often did your partner scream or curse at you?: 1    PHYSICAL EXAM  GENERAL EXAM/CONSTITUTIONAL: Vitals:  Vitals:   10/27/22 1002  BP: 119/76  Pulse: 62  Weight: 228 lb (103.4 kg)  Height: 6\' 1"  (1.854 m)   Body mass index is 30.08 kg/m. Wt Readings from Last 3 Encounters:  10/27/22 228 lb (103.4 kg)  08/16/22 220 lb 7.4 oz (100 kg)  11/03/21 230 lb 4.8 oz (104.5 kg)   Patient is in no distress; well developed, nourished and groomed; neck is supple awake, alert Able to mimic examiner  Able to tell me his date of birth  He is full in the BUE and BLE  Normal gait    DIAGNOSTIC DATA (LABS, IMAGING, TESTING) - I reviewed patient records, labs, notes, testing and imaging myself where available.  Lab Results  Component Value Date   WBC 7.3 08/17/2022   HGB 11.5 (L) 08/17/2022   HCT 34.6 (L) 08/17/2022   MCV 95.3 08/17/2022   PLT 107 (L) 08/17/2022      Component Value Date/Time   NA 138 08/17/2022 0214   NA 141 12/01/2016 0830   K 4.0 08/17/2022 0214   K 4.3 12/01/2016 0830   CL 110 08/17/2022 0214   CL 98 05/28/2012 1453   CO2 20 (L) 08/17/2022 0214   CO2 27 12/01/2016 0830    GLUCOSE 91 08/17/2022 0214   GLUCOSE 118 12/01/2016 0830   GLUCOSE 81 05/28/2012 1453   BUN 17 08/17/2022 0214   BUN 26.0 12/01/2016 0830   CREATININE 1.06 08/17/2022 0214   CREATININE 1.45 (H) 11/03/2021 0958   CREATININE 1.4 (H) 12/01/2016 0830   CALCIUM 8.3 (L) 08/17/2022 0214   CALCIUM 9.9 12/01/2016 0830   PROT 6.4 (L) 08/16/2022 0820   PROT 7.4 12/01/2016 0830   ALBUMIN 3.6 08/16/2022 0820   ALBUMIN 4.0 12/01/2016 0830   AST 37 08/16/2022 0820   AST 33 11/03/2021 0958   AST 38 (H) 12/01/2016 0830   ALT 31 08/16/2022 0820   ALT 31 11/03/2021 0958   ALT 26 12/01/2016 0830   ALKPHOS 56 08/16/2022 0820   ALKPHOS 85 12/01/2016 0830   BILITOT 0.3 08/16/2022 0820   BILITOT 0.4 11/03/2021 0958   BILITOT 0.35 12/01/2016 0830   GFRNONAA >60 08/17/2022 0214   GFRNONAA >60 11/03/2021 0958   GFRAA >60 11/05/2019 1307   No results found for: "CHOL", "HDL", "LDLCALC", "LDLDIRECT", "TRIG" No results found for: "HGBA1C" No results found for: "VITAMINB12" Lab Results  Component Value Date   TSH 1.170 08/16/2022   MRI Brain 08/16/2022 No acute intracranial process.    EEG 08/16/2022 - Intermittent slow, generalized    I personally reviewed brain Images and previous EEG reports.   ASSESSMENT AND PLAN  47 y.o. year old male  with history of autism, seizure disorder, intellectual disability, hypertension who is presenting after a breakthrough seizure in June.  His Depakote was increased from 500 mg twice daily to 625 mg twice daily.  He is tolerating the medication very well, no side effects.  Per Ortencia Kick, the caregiver patient is back to his normal self.  Plan is to continue patient  on current dose, Depakote 625 mg twice daily and zonisamide 300 mg nightly.  I will also obtain a Depakote level and LFTs.  I will see them in 1 year for follow-up or sooner if worse.   1. Seizures (HCC)   2. Therapeutic drug monitoring   3. Autism     Patient Instructions  Continue current  medications Depakote 625 mg twice daily  Zonisamide 300 mg nightly , refill was Depakote given  Will check Depakote level and LFTs  Continue to follow up with PCP  Return in a year    Per [facility] statutes, patients with seizures are not allowed to drive until they have been seizure-free for six months.  Other recommendations include using caution when using heavy equipment or power tools. Avoid working on ladders or at heights. Take showers instead of baths.  Do not swim alone.  Ensure the water temperature is not too high on the home water heater. Do not go swimming alone. Do not lock yourself in a room alone (i.e. bathroom). When caring for infants or small children, sit down when holding, feeding, or changing them to minimize risk of injury to the child in the event you have a seizure. Maintain good sleep hygiene. Avoid alcohol.  Also recommend adequate sleep, hydration, good diet and minimize stress.   During the Seizure  - First, ensure adequate ventilation and place patients on the floor on their left side  Loosen clothing around the neck and ensure the airway is patent. If the patient is clenching the teeth, do not force the mouth open with any object as this can cause severe damage - Remove all items from the surrounding that can be hazardous. The patient may be oblivious to what's happening and may not even know what he or she is doing. If the patient is confused and wandering, either gently guide him/her away and block access to outside areas - Reassure the individual and be comforting - Call 911. In most cases, the seizure ends before EMS arrives. However, there are cases when seizures may last over 3 to 5 minutes. Or the individual may have developed breathing difficulties or severe injuries. If a pregnant patient or a person with diabetes develops a seizure, it is prudent to call an ambulance. - Finally, if the patient does not regain full consciousness, then call EMS. Most  patients will remain confused for about 45 to 90 minutes after a seizure, so you must use judgment in calling for help. - Avoid restraints but make sure the patient is in a bed with padded side rails - Place the individual in a lateral position with the neck slightly flexed; this will help the saliva drain from the mouth and prevent the tongue from falling backward - Remove all nearby furniture and other hazards from the area - Provide verbal assurance as the individual is regaining consciousness - Provide the patient with privacy if possible - Call for help and start treatment as ordered by the caregiver   After the Seizure (Postictal Stage)  After a seizure, most patients experience confusion, fatigue, muscle pain and/or a headache. Thus, one should permit the individual to sleep. For the next few days, reassurance is essential. Being calm and helping reorient the person is also of importance.  Most seizures are painless and end spontaneously. Seizures are not harmful to others but can lead to complications such as stress on the lungs, brain and the heart. Individuals with prior lung problems may develop labored  breathing and respiratory distress.     Orders Placed This Encounter  Procedures   Valproic Acid Level   Hepatic Function Panel    Meds ordered this encounter  Medications   divalproex (DEPAKOTE SPRINKLE) 125 MG capsule    Sig: Take 1 capsule (125 mg total) by mouth every 12 (twelve) hours.    Dispense:  180 capsule    Refill:  3    ok to change to capsule per MD/maf   DISCONTD: divalproex (DEPAKOTE ER) 500 MG 24 hr tablet    Sig: Take 1 tablet (500 mg total) by mouth in the morning and at bedtime.    Dispense:  180 tablet    Refill:  3   divalproex (DEPAKOTE) 500 MG DR tablet    Sig: Take 1 tablet (500 mg total) by mouth every 12 (twelve) hours.    Dispense:  180 tablet    Refill:  3    Return in about 1 year (around 10/27/2023).    [name], MD 10/27/2022,  2:47 PM  [facility] [address] [place], [place] [address] [phone]

## 2022-10-28 LAB — HEPATIC FUNCTION PANEL
ALT: 26 IU/L (ref 0–44)
AST: 35 IU/L (ref 0–40)
Albumin: 4.1 g/dL (ref 4.1–5.1)
Alkaline Phosphatase: 67 IU/L (ref 44–121)
Bilirubin Total: 0.3 mg/dL (ref 0.0–1.2)
Bilirubin, Direct: 0.1 mg/dL (ref 0.00–0.40)
Total Protein: 7 g/dL (ref 6.0–8.5)

## 2022-10-28 LAB — VALPROIC ACID LEVEL: Valproic Acid Lvl: 97 ug/mL (ref 50–100)

## 2022-11-06 ENCOUNTER — Other Ambulatory Visit: Payer: Self-pay | Admitting: Physician Assistant

## 2022-11-06 DIAGNOSIS — C9210 Chronic myeloid leukemia, BCR/ABL-positive, not having achieved remission: Secondary | ICD-10-CM

## 2022-11-07 ENCOUNTER — Inpatient Hospital Stay (HOSPITAL_BASED_OUTPATIENT_CLINIC_OR_DEPARTMENT_OTHER): Payer: Medicare Other | Admitting: Physician Assistant

## 2022-11-07 ENCOUNTER — Inpatient Hospital Stay: Payer: Medicare Other | Attending: Physician Assistant

## 2022-11-07 VITALS — BP 142/91 | HR 71 | Temp 97.7°F | Resp 16 | Ht 73.0 in | Wt 234.1 lb

## 2022-11-07 DIAGNOSIS — Z79890 Hormone replacement therapy (postmenopausal): Secondary | ICD-10-CM | POA: Diagnosis not present

## 2022-11-07 DIAGNOSIS — C9210 Chronic myeloid leukemia, BCR/ABL-positive, not having achieved remission: Secondary | ICD-10-CM | POA: Insufficient documentation

## 2022-11-07 DIAGNOSIS — Z79899 Other long term (current) drug therapy: Secondary | ICD-10-CM | POA: Diagnosis not present

## 2022-11-07 DIAGNOSIS — F840 Autistic disorder: Secondary | ICD-10-CM | POA: Insufficient documentation

## 2022-11-07 LAB — CMP (CANCER CENTER ONLY)
ALT: 27 U/L (ref 0–44)
AST: 32 U/L (ref 15–41)
Albumin: 4.1 g/dL (ref 3.5–5.0)
Alkaline Phosphatase: 66 U/L (ref 38–126)
Anion gap: 4 — ABNORMAL LOW (ref 5–15)
BUN: 18 mg/dL (ref 6–20)
CO2: 28 mmol/L (ref 22–32)
Calcium: 9.6 mg/dL (ref 8.9–10.3)
Chloride: 108 mmol/L (ref 98–111)
Creatinine: 1.24 mg/dL (ref 0.61–1.24)
GFR, Estimated: >60 mL/min (ref >60)
Glucose, Bld: 79 mg/dL (ref 70–99)
Potassium: 4.7 mmol/L (ref 3.5–5.1)
Sodium: 140 mmol/L (ref 135–145)
Total Bilirubin: 0.4 mg/dL (ref 0.3–1.2)
Total Protein: 7.1 g/dL (ref 6.5–8.1)

## 2022-11-07 LAB — CBC WITH DIFFERENTIAL (CANCER CENTER ONLY)
Abs Immature Granulocytes: 0.01 10*3/uL (ref 0.00–0.07)
Basophils Absolute: 0 10*3/uL (ref 0.0–0.1)
Basophils Relative: 0 %
Eosinophils Absolute: 0.3 10*3/uL (ref 0.0–0.5)
Eosinophils Relative: 5 %
HCT: 36.4 % — ABNORMAL LOW (ref 39.0–52.0)
Hemoglobin: 12.4 g/dL — ABNORMAL LOW (ref 13.0–17.0)
Immature Granulocytes: 0 %
Lymphocytes Relative: 44 %
Lymphs Abs: 2.4 10*3/uL (ref 0.7–4.0)
MCH: 32.8 pg (ref 26.0–34.0)
MCHC: 34.1 g/dL (ref 30.0–36.0)
MCV: 96.3 fL (ref 80.0–100.0)
Monocytes Absolute: 0.6 10*3/uL (ref 0.1–1.0)
Monocytes Relative: 11 %
Neutro Abs: 2.2 10*3/uL (ref 1.7–7.7)
Neutrophils Relative %: 40 %
Platelet Count: 141 10*3/uL — ABNORMAL LOW (ref 150–400)
RBC: 3.78 MIL/uL — ABNORMAL LOW (ref 4.22–5.81)
RDW: 15.6 % — ABNORMAL HIGH (ref 11.5–15.5)
WBC Count: 5.4 10*3/uL (ref 4.0–10.5)
nRBC: 0 % (ref 0.0–0.2)

## 2022-11-07 NOTE — Progress Notes (Signed)
[facility] [phone]   Fax:([phone]  PROGRESS NOTE  Patient Care Team: [name], [name] as PCP - General (Nurse Practitioner)  CHIEF COMPLAINTS/PURPOSE OF CONSULTATION:  Chronic Myeloid Leukemia, chronic phase  CURRENT TREATMENT: Gleevac 100 mg PO daily   HISTORY OF PRESENTING ILLNESS:  Darrell Russo 47 y.o. 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 He was last seen by Darrell Russo on 11/03/2021. He is accompanied by his mother for this visit.   On exam today, Darrell Russo continues to do well without any new or concerning symptoms. He is tolerating Gleevac therapy without any issues. He is currently residing in a group home. His energy and appetite are stable. He denies nausea, vomiting or bowel habit changes. He denies easy bruising or signs of bleeding. He denies fevers, chills, sweats, shortness of breath, chest pain or cough. He has no other complaints. Rest of the ROS is below.   MEDICAL HISTORY:  Past Medical History:  Diagnosis Date   Autism    Folliculitis    Hypertension    Impulse control disorder    Leukemia (HCC)    TMJ (dislocation of temporomandibular joint)     SURGICAL HISTORY: Past Surgical History:  Procedure Laterality Date   ANKLE SURGERY     l/ankle fracture 5 years ago   ORIF HIP FRACTURE Right 12/13/2016   Procedure: ORIF of Right Femoral neck fracture;  Surgeon: Roby Lofts, MD;  Location: [facility] OR;  Service: Orthopedics;  Laterality: Right;    SOCIAL HISTORY: Social History   Socioeconomic History   Marital status: Single    Spouse name: Not on file   Number of children: Not on file   Years of education: Not on file   Highest education level: Not on file  Occupational History   Not on file  Tobacco Use   Smoking status: Never   Smokeless tobacco: Never  Substance and Sexual Activity   Alcohol use: No   Drug use: No   Sexual activity: Not on file   Other Topics Concern   Not on file  Social History Narrative   Rare coffee or soda   Social Determinants of Health   Financial Resource Strain: Low Risk  (02/03/2022)   Received from [facility], [facility]   Overall Financial Resource Strain (CARDIA)    Difficulty of Paying Living Expenses: Not hard at all  Food Insecurity: No Food Insecurity (02/03/2022)   Received from [facility], [facility]   Hunger Vital Sign    Worried About Running Out of Food in the Last Year: Never true    Ran Out of Food in the Last Year: Never true  Transportation Needs: Not on file  Physical Activity: Insufficiently Active (02/03/2022)   Received from [facility], [facility]   Exercise Vital Sign    Days of Exercise per Week: 5 days    Minutes of Exercise per Session: 20 min  Stress: No Stress Concern Present (02/03/2022)   Received from [facility], [facility] of Occupational Health - Occupational Stress Questionnaire    Feeling of Stress : Not at all  Social Connections: Socially Integrated (02/03/2022)   Received from [facility], [facility]   Social Network    How would you rate your social network (family, work, friends)?: Good participation with social networks  Intimate Partner Violence: Not At Risk (02/03/2022)   Received from [facility], [facility]   HITS    Over the last 12 months how often did your partner physically hurt you?: 1    Over the last 12 months how often did your partner insult you or talk down to you?: 1    Over the last 12 months how often did your partner threaten you with physical harm?: 1    Over the last 12 months how often did your partner scream or curse at you?: 1    FAMILY HISTORY: No family history on file.  ALLERGIES:  has No Known Allergies.  MEDICATIONS:  Current Outpatient Medications  Medication Sig Dispense Refill   divalproex (DEPAKOTE SPRINKLE) 125 MG capsule Take 1 capsule (125 mg total) by mouth every  12 (twelve) hours. 180 capsule 3   divalproex (DEPAKOTE) 500 MG DR tablet Take 1 tablet (500 mg total) by mouth every 12 (twelve) hours. 180 tablet 3   Emollient (CERAVE MOISTURIZING EX) Apply topically. TID AS NEEDED     Emollient (CETAPHIL) cream Apply 1 Application topically 2 (two) times daily.     furosemide (LASIX) 20 MG tablet Take 20 mg by mouth daily.     GAVILAX 17 GM/SCOOP powder Take by mouth.     haloperidol (HALDOL) 0.5 MG tablet Take 1.5 mg by mouth at bedtime.     hydrOXYzine (ATARAX) 25 MG tablet Take 25 mg by mouth 3 (three) times daily.     imatinib (GLEEVEC) 100 MG tablet Take 1 tablet (100 mg total) by mouth 2 (two) times daily.     levothyroxine (SYNTHROID) 200 MCG tablet Take 200 mcg by mouth daily.     meloxicam (MOBIC) 15 MG tablet Take 15 mg by mouth daily.     metoprolol (TOPROL-XL) 50 MG 24 hr tablet Take 50 mg by mouth daily.       miconazole (MICOTIN) 2 % powder Apply 1 Application topically 2 (two) times daily.     minocycline (MINOCIN,DYNACIN) 100 MG capsule Take 100 mg by mouth 2 (two) times daily.       nystatin cream (MYCOSTATIN) Apply 1 application topically at bedtime.     polyethylene glycol (MIRALAX / GLYCOLAX) 17 g packet Take 17 g by mouth daily.     senna (SENOKOT) 8.6 MG TABS tablet Take 1 tablet by mouth daily as needed for mild constipation.     triamcinolone ointment (KENALOG) 0.1 % Apply 1 Application topically 2 (two) times daily.     vitamin D3 (CHOLECALCIFEROL) 25 MCG tablet Take 1,000 Units by mouth daily.     zonisamide (ZONEGRAN) 100 MG capsule Take 3 capsules (300 mg total) by mouth at bedtime.     doxepin (SINEQUAN) 25 MG capsule Take 25 mg by mouth at bedtime as needed (Itching). (Patient not taking: Reported on 11/07/2022)     No current facility-administered medications for this visit.    REVIEW OF SYSTEMS:   Constitutional: ( - ) fevers, ( - )  chills , ( - ) night sweats Eyes: ( - ) blurriness of vision, ( - ) double vision, ( - )  watery eyes Ears, nose, mouth, throat, and face: ( - ) mucositis, ( - ) sore throat Respiratory: ( - ) cough, ( - ) dyspnea, ( - ) wheezes Cardiovascular: ( - ) palpitation, ( - ) chest discomfort, ( - ) lower extremity swelling Gastrointestinal:  ( - ) nausea, ( - ) heartburn, ( - ) change in bowel habits Skin: ( - ) abnormal skin rashes Lymphatics: ( - )  new lymphadenopathy, ( - ) easy bruising Neurological: ( - ) numbness, ( - ) tingling, ( - ) new weaknesses Behavioral/Psych: ( - ) mood change, ( - ) new changes  All other systems were reviewed with the patient and are negative.  PHYSICAL EXAMINATION: ECOG PERFORMANCE STATUS: 0 - Asymptomatic  Vitals:   11/07/22 1043  BP: (!) 142/91  Pulse: 71  Resp: 16  Temp: 97.7 F (36.5 C)  SpO2: 100%   Filed Weights   11/07/22 1043  Weight: 234 lb 1.6 oz (106.2 kg)    GENERAL: well appearing male in NAD  SKIN: skin color, texture, turgor are normal, no rashes or significant lesions EYES: conjunctiva are pink and non-injected, sclera clear OROPHARYNX: no exudate, no erythema; lips, buccal mucosa, and tongue normal  LUNGS: clear to auscultation and percussion with normal breathing effort HEART: regular rate & rhythm and no murmurs and no lower extremity edema Musculoskeletal: no cyanosis of digits and no clubbing  PSYCH: alert & oriented x 3, fluent speech NEURO: no focal motor/sensory deficits  LABORATORY DATA:  I have reviewed the data as listed    Latest Ref Rng & Units 11/07/2022   10:23 AM 08/17/2022    2:14 AM 08/16/2022   11:33 AM  CBC  WBC 4.0 - 10.5 K/uL 5.4  7.3    Hemoglobin 13.0 - 17.0 g/dL 16.1  09.6  04.5   Hematocrit 39.0 - 52.0 % 36.4  34.6  33.0   Platelets 150 - 400 K/uL 141  107         Latest Ref Rng & Units 11/07/2022   10:23 AM 10/27/2022   10:38 AM 08/17/2022    2:14 AM  CMP  Glucose 70 - 99 mg/dL 79   91   BUN 6 - 20 mg/dL 18   17   Creatinine 4.09 - 1.24 mg/dL 8.11   9.14   Sodium 782 - 145 mmol/L  140   138   Potassium 3.5 - 5.1 mmol/L 4.7   4.0   Chloride 98 - 111 mmol/L 108   110   CO2 22 - 32 mmol/L 28   20   Calcium 8.9 - 10.3 mg/dL 9.6   8.3   Total Protein 6.5 - 8.1 g/dL 7.1  7.0    Total Bilirubin 0.3 - 1.2 mg/dL 0.4  0.3    Alkaline Phos 38 - 126 U/L 66  67    AST 15 - 41 U/L 32  35    ALT 0 - 44 U/L 27  26      ASSESSMENT & PLAN Darrell Russo is a 47 y.o. male who presents for a follow up for continued management of CML. He will transfer his care from Darrell Russo to Darrell Russo.   #CML, chronic phase: --Diagnosed in 2007 and started on Gleevac 400 mg in 2007 --Labs today show WBC 5.4, Hgb 12.4, MCV 96.3, Plt 141K. Creatinine and LFTs normal. --Most recent BCR/ABL PCR levels from 11/03/2021 showed undetectable levels. Today's levels are pending --Discussed role for discontinuing Gleevac therapy since patient has been on therapy for >3 years. Reviewed relapse risk of 50%. Patient and mother would like to think about this before making a decision. If patient decides to proceed with surveillance we will check labs q 4 weeks x 6 months and then q 8 week for remaining 6 months.  --Tentative return in 3 months for lab check and 6 months for labs/follow up unless patient decides to proceed  with surveillance.   Orders Placed This Encounter  Procedures   CBC with Differential (Cancer Center Only)    Standing Status:   Standing    Number of Occurrences:   2    Standing Expiration Date:   11/07/2023   CMP (Cancer Center only)    Standing Status:   Standing    Number of Occurrences:   2    Standing Expiration Date:   11/07/2023   bcr/abl-PCR    Standing Status:   Standing    Number of Occurrences:   2    Standing Expiration Date:   11/07/2023    All questions were answered. The patient knows to call the clinic with any problems, questions or concerns.  I have spent a total of 30 minutes minutes of face-to-face and non-face-to-face time, preparing to see the patient,performing a  medically appropriate examination, counseling and educating the patient, ordering tests/procedures,  documenting clinical information in the electronic health record, independently interpreting results and communicating results to the patient, and care coordination.   Darrell Kaufmann, Darrell Russo Department of Hematology/Oncology [facility] at [facility] Phone: [phone]  Patient was seen with Darrell Russo.   I have read the above note and personally examined the patient. I agree with the assessment and plan as noted above.  Briefly Darrell Russo is a 47 year old male with medical history significant for CML currently on therapy with imatinib.  He previously followed with Darrell Russo and has been on this therapy since 2007.  His levels have been undetectable.  Today we discussed the option to discontinue imatinib therapy and monitor closely.  The patient notes he would like some time to consider in the interim we will continue on his imatinib therapy.  His levels have remained undetectable for many years.  Other labs show a mild anemia and thrombocytopenia, but otherwise he appears to be tolerating his therapy well.   Ulysees Barns, MD Department of Hematology/Oncology [facility] at [facility] Phone: [phone] Pager: [phone] Email: [email or web address]

## 2022-11-14 LAB — BCR/ABL

## 2022-11-16 ENCOUNTER — Telehealth: Payer: Self-pay

## 2022-11-16 NOTE — Telephone Encounter (Signed)
-----   Message from Briant Cedar sent at 11/16/2022  8:47 AM EDT ----- Please notify patient that his BCR/ABL levels are undetectable. Confirm if he wants to continue with Gleevac or go on surveillance as discussed during his last visit. ----- Message ----- From: Interface, Lab In [place] Sent: 11/07/2022  10:32 AM EDT To: Briant Cedar, PA-C

## 2022-11-16 NOTE — Telephone Encounter (Signed)
Spoke with pt 's mother and advised of lab results.  She has requested to continue with Gleevac. She prefers not to have labs drawn on a regular basis due to pt's mental health

## 2023-02-05 ENCOUNTER — Inpatient Hospital Stay: Payer: Medicare Other | Attending: Physician Assistant

## 2023-02-05 DIAGNOSIS — Z79899 Other long term (current) drug therapy: Secondary | ICD-10-CM | POA: Insufficient documentation

## 2023-02-05 DIAGNOSIS — C9210 Chronic myeloid leukemia, BCR/ABL-positive, not having achieved remission: Secondary | ICD-10-CM | POA: Insufficient documentation

## 2023-02-05 LAB — CBC WITH DIFFERENTIAL (CANCER CENTER ONLY)
Abs Immature Granulocytes: 0.02 10*3/uL (ref 0.00–0.07)
Basophils Absolute: 0 10*3/uL (ref 0.0–0.1)
Basophils Relative: 1 %
Eosinophils Absolute: 0.1 10*3/uL (ref 0.0–0.5)
Eosinophils Relative: 2 %
HCT: 37.4 % — ABNORMAL LOW (ref 39.0–52.0)
Hemoglobin: 12.5 g/dL — ABNORMAL LOW (ref 13.0–17.0)
Immature Granulocytes: 0 %
Lymphocytes Relative: 43 %
Lymphs Abs: 2.9 10*3/uL (ref 0.7–4.0)
MCH: 33.2 pg (ref 26.0–34.0)
MCHC: 33.4 g/dL (ref 30.0–36.0)
MCV: 99.5 fL (ref 80.0–100.0)
Monocytes Absolute: 0.7 10*3/uL (ref 0.1–1.0)
Monocytes Relative: 10 %
Neutro Abs: 3 10*3/uL (ref 1.7–7.7)
Neutrophils Relative %: 44 %
Platelet Count: 187 10*3/uL (ref 150–400)
RBC: 3.76 MIL/uL — ABNORMAL LOW (ref 4.22–5.81)
RDW: 15.2 % (ref 11.5–15.5)
WBC Count: 6.6 10*3/uL (ref 4.0–10.5)
nRBC: 0 % (ref 0.0–0.2)

## 2023-02-05 LAB — CMP (CANCER CENTER ONLY)
ALT: 24 U/L (ref 0–44)
AST: 28 U/L (ref 15–41)
Albumin: 4.1 g/dL (ref 3.5–5.0)
Alkaline Phosphatase: 67 U/L (ref 38–126)
Anion gap: 6 (ref 5–15)
BUN: 19 mg/dL (ref 6–20)
CO2: 29 mmol/L (ref 22–32)
Calcium: 9.7 mg/dL (ref 8.9–10.3)
Chloride: 105 mmol/L (ref 98–111)
Creatinine: 1.49 mg/dL — ABNORMAL HIGH (ref 0.61–1.24)
GFR, Estimated: 59 mL/min — ABNORMAL LOW (ref >60)
Glucose, Bld: 86 mg/dL (ref 70–99)
Potassium: 4.3 mmol/L (ref 3.5–5.1)
Sodium: 140 mmol/L (ref 135–145)
Total Bilirubin: 0.3 mg/dL (ref <1.2)
Total Protein: 7.2 g/dL (ref 6.5–8.1)

## 2023-02-11 LAB — BCR-ABL1, CML/ALL, PCR, QUANT
E1A2 Transcript: <0.0032 %
Interpretation (BCRAL):: NEGATIVE
b2a2 transcript: <0.0032 %
b3a2 transcript: <0.0032 %

## 2023-02-13 ENCOUNTER — Telehealth: Payer: Self-pay

## 2023-02-13 NOTE — Telephone Encounter (Signed)
Annice Pih at the [facility] advised of lab results and recommendations.  Labs faxed to [phone]  Confirmation received

## 2023-02-13 NOTE — Telephone Encounter (Signed)
-----   Message from Pollyann Samples sent at 02/13/2023  1:21 PM EST ----- Please let pt know BCR-ABL is negative, no concerns. Mild anemia is stable. He does have mildly decreased renal function, please have him f/up with PCP. Return in 3 months as scheduled.   Thanks Lacie NP

## 2023-04-06 ENCOUNTER — Telehealth: Payer: Self-pay | Admitting: Physician Assistant

## 2023-04-06 NOTE — Telephone Encounter (Signed)
 Rescheduled appointments per provider on PAL. Patient was left a voicemail and will be mailed an appointment reminder.

## 2023-05-03 ENCOUNTER — Telehealth: Payer: Self-pay | Admitting: Hematology and Oncology

## 2023-05-07 ENCOUNTER — Other Ambulatory Visit: Payer: Medicare Other

## 2023-05-07 ENCOUNTER — Ambulatory Visit: Payer: Medicare Other | Admitting: Hematology and Oncology

## 2023-05-08 ENCOUNTER — Inpatient Hospital Stay (HOSPITAL_BASED_OUTPATIENT_CLINIC_OR_DEPARTMENT_OTHER): Admitting: Hematology and Oncology

## 2023-05-08 ENCOUNTER — Inpatient Hospital Stay: Attending: Physician Assistant

## 2023-05-08 VITALS — BP 148/90 | HR 64 | Temp 97.2°F | Resp 13 | Wt 245.1 lb

## 2023-05-08 DIAGNOSIS — Z79899 Other long term (current) drug therapy: Secondary | ICD-10-CM | POA: Insufficient documentation

## 2023-05-08 DIAGNOSIS — C9210 Chronic myeloid leukemia, BCR/ABL-positive, not having achieved remission: Secondary | ICD-10-CM | POA: Insufficient documentation

## 2023-05-08 DIAGNOSIS — F840 Autistic disorder: Secondary | ICD-10-CM | POA: Insufficient documentation

## 2023-05-08 LAB — CBC WITH DIFFERENTIAL (CANCER CENTER ONLY)
Abs Immature Granulocytes: 0.01 10*3/uL (ref 0.00–0.07)
Basophils Absolute: 0 10*3/uL (ref 0.0–0.1)
Basophils Relative: 0 %
Eosinophils Absolute: 0.2 10*3/uL (ref 0.0–0.5)
Eosinophils Relative: 3 %
HCT: 37.9 % — ABNORMAL LOW (ref 39.0–52.0)
Hemoglobin: 12.7 g/dL — ABNORMAL LOW (ref 13.0–17.0)
Immature Granulocytes: 0 %
Lymphocytes Relative: 36 %
Lymphs Abs: 2 10*3/uL (ref 0.7–4.0)
MCH: 32.8 pg (ref 26.0–34.0)
MCHC: 33.5 g/dL (ref 30.0–36.0)
MCV: 97.9 fL (ref 80.0–100.0)
Monocytes Absolute: 0.5 10*3/uL (ref 0.1–1.0)
Monocytes Relative: 9 %
Neutro Abs: 2.9 10*3/uL (ref 1.7–7.7)
Neutrophils Relative %: 52 %
Platelet Count: 146 10*3/uL — ABNORMAL LOW (ref 150–400)
RBC: 3.87 MIL/uL — ABNORMAL LOW (ref 4.22–5.81)
RDW: 15.2 % (ref 11.5–15.5)
WBC Count: 5.6 10*3/uL (ref 4.0–10.5)
nRBC: 0 % (ref 0.0–0.2)

## 2023-05-08 LAB — CMP (CANCER CENTER ONLY)
ALT: 27 U/L (ref 0–44)
AST: 38 U/L (ref 15–41)
Albumin: 4.4 g/dL (ref 3.5–5.0)
Alkaline Phosphatase: 54 U/L (ref 38–126)
Anion gap: 4 — ABNORMAL LOW (ref 5–15)
BUN: 17 mg/dL (ref 6–20)
CO2: 30 mmol/L (ref 22–32)
Calcium: 9.2 mg/dL (ref 8.9–10.3)
Chloride: 106 mmol/L (ref 98–111)
Creatinine: 1.42 mg/dL — ABNORMAL HIGH (ref 0.61–1.24)
GFR, Estimated: >60 mL/min (ref >60)
Glucose, Bld: 88 mg/dL (ref 70–99)
Potassium: 4.2 mmol/L (ref 3.5–5.1)
Sodium: 140 mmol/L (ref 135–145)
Total Bilirubin: 0.4 mg/dL (ref 0.0–1.2)
Total Protein: 7.2 g/dL (ref 6.5–8.1)

## 2023-05-08 NOTE — Progress Notes (Signed)
 [facility] Telephone:([phone]   [phone]  PROGRESS NOTE  Patient Care Team: [name], [name] as PCP - General (Nurse Practitioner)  CHIEF COMPLAINTS/PURPOSE OF CONSULTATION:  Chronic Myeloid Leukemia, chronic phase  CURRENT TREATMENT: Gleevac 100 mg PO daily   HISTORY OF PRESENTING ILLNESS:  Darrell Russo 48 y.o. 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 He was last seen on 11/07/2022. He is accompanied by his mother for this visit.   On exam today, Mr. Givler reports he has been well overall with interim since her last visit.  He reports his energy is good and he is not having any lightheadedness, dizziness, or fatigue.  He reports his appetite is strong and he is eating well.  He has not started any new medications or had any major changes in his health in the interim since her last visit.  He denies any palpitations of the heart or chest pain/shortness of breath.  He said no recent infectious symptoms such as runny nose, sore throat, or cough.. He denies easy bruising or signs of bleeding. He denies fevers, chills, sweats, shortness of breath, chest pain or cough. He has no other complaints. Rest of the ROS is below.   MEDICAL HISTORY:  Past Medical History:  Diagnosis Date   Autism    Folliculitis    Hypertension    Impulse control disorder    Leukemia (HCC)    TMJ (dislocation of temporomandibular joint)     SURGICAL HISTORY: Past Surgical History:  Procedure Laterality Date   ANKLE SURGERY     l/ankle fracture 5 years ago   ORIF HIP FRACTURE Right 12/13/2016   Procedure: ORIF of Right Femoral neck fracture;  Surgeon: Roby Lofts, MD;  Location: [facility] OR;  Service: Orthopedics;  Laterality: Right;    SOCIAL HISTORY: Social History   Socioeconomic History   Marital status: Single    Spouse name: Not on file   Number of children: Not on file   Years of education: Not on file    Highest education level: Not on file  Occupational History   Not on file  Tobacco Use   Smoking status: Never   Smokeless tobacco: Never  Substance and Sexual Activity   Alcohol use: No   Drug use: No   Sexual activity: Not on file  Other Topics Concern   Not on file  Social History Narrative   Rare coffee or soda   Social Drivers of Health   Financial Resource Strain: Low Risk  (02/08/2023)   Received from [facility]   Overall Financial Resource Strain (CARDIA)    Difficulty of Paying Living Expenses: Not very hard  Food Insecurity: No Food Insecurity (02/08/2023)   Received from [facility]   Hunger Vital Sign    Worried About Running Out of Food in the Last Year: Never true    Ran Out of Food in the Last Year: Never true  Transportation Needs: No Transportation Needs (02/08/2023)   Received from [facility] - Transportation    Lack of Transportation (Medical): No    Lack of Transportation (Non-Medical): No  Physical Activity: Unknown (02/08/2023)   Received from [facility]   Exercise Vital Sign    Days of Exercise per Week: 0 days    Minutes of Exercise per Session: Not on file  Stress: No Stress Concern Present (02/08/2023)   Received from [facility]  of Occupational Health - Occupational Stress Questionnaire    Feeling of Stress : Not at all  Social Connections: Somewhat Isolated (02/08/2023)   Received from [facility]   Social Network    How would you rate your social network (family, work, friends)?: Restricted participation with some degree of social isolation  Intimate Partner Violence: Not At Risk (02/08/2023)   Received from [facility]   HITS    Over the last 12 months how often did your partner physically hurt you?: Never    Over the last 12 months how often did your partner insult you or talk down to you?: Never    Over the last 12 months how often did your partner threaten you with physical harm?: Never     Over the last 12 months how often did your partner scream or curse at you?: Never    FAMILY HISTORY: No family history on file.  ALLERGIES:  has no known allergies.  MEDICATIONS:  Current Outpatient Medications  Medication Sig Dispense Refill   divalproex (DEPAKOTE SPRINKLE) 125 MG capsule Take 1 capsule (125 mg total) by mouth every 12 (twelve) hours. 180 capsule 3   divalproex (DEPAKOTE) 500 MG DR tablet Take 1 tablet (500 mg total) by mouth every 12 (twelve) hours. 180 tablet 3   doxepin (SINEQUAN) 25 MG capsule Take 25 mg by mouth at bedtime as needed (Itching). (Patient not taking: Reported on 11/07/2022)     Emollient (CERAVE MOISTURIZING EX) Apply topically. TID AS NEEDED     Emollient (CETAPHIL) cream Apply 1 Application topically 2 (two) times daily.     furosemide (LASIX) 20 MG tablet Take 20 mg by mouth daily.     GAVILAX 17 GM/SCOOP powder Take by mouth.     haloperidol (HALDOL) 0.5 MG tablet Take 1.5 mg by mouth at bedtime.     hydrOXYzine (ATARAX) 25 MG tablet Take 25 mg by mouth 3 (three) times daily.     imatinib (GLEEVEC) 100 MG tablet Take 1 tablet (100 mg total) by mouth 2 (two) times daily.     levothyroxine (SYNTHROID) 200 MCG tablet Take 200 mcg by mouth daily.     meloxicam (MOBIC) 15 MG tablet Take 15 mg by mouth daily.     metoprolol (TOPROL-XL) 50 MG 24 hr tablet Take 50 mg by mouth daily.       miconazole (MICOTIN) 2 % powder Apply 1 Application topically 2 (two) times daily.     minocycline (MINOCIN,DYNACIN) 100 MG capsule Take 100 mg by mouth 2 (two) times daily.       nystatin cream (MYCOSTATIN) Apply 1 application topically at bedtime.     polyethylene glycol (MIRALAX / GLYCOLAX) 17 g packet Take 17 g by mouth daily.     senna (SENOKOT) 8.6 MG TABS tablet Take 1 tablet by mouth daily as needed for mild constipation.     triamcinolone ointment (KENALOG) 0.1 % Apply 1 Application topically 2 (two) times daily.     vitamin D3 (CHOLECALCIFEROL) 25 MCG tablet  Take 1,000 Units by mouth daily.     zonisamide (ZONEGRAN) 100 MG capsule Take 3 capsules (300 mg total) by mouth at bedtime.     No current facility-administered medications for this visit.    REVIEW OF SYSTEMS:   Constitutional: ( - ) fevers, ( - )  chills , ( - ) night sweats Eyes: ( - ) blurriness of vision, ( - ) double vision, ( - ) watery eyes Ears, nose, mouth,  throat, and face: ( - ) mucositis, ( - ) sore throat Respiratory: ( - ) cough, ( - ) dyspnea, ( - ) wheezes Cardiovascular: ( - ) palpitation, ( - ) chest discomfort, ( - ) lower extremity swelling Gastrointestinal:  ( - ) nausea, ( - ) heartburn, ( - ) change in bowel habits Skin: ( - ) abnormal skin rashes Lymphatics: ( - ) new lymphadenopathy, ( - ) easy bruising Neurological: ( - ) numbness, ( - ) tingling, ( - ) new weaknesses Behavioral/Psych: ( - ) mood change, ( - ) new changes  All other systems were reviewed with the patient and are negative.  PHYSICAL EXAMINATION: ECOG PERFORMANCE STATUS: 0 - Asymptomatic  Vitals:   05/08/23 1321  BP: (!) 148/90  Pulse: 64  Resp: 13  Temp: (!) 97.2 F (36.2 C)  SpO2: 100%    Filed Weights   05/08/23 1321  Weight: 245 lb 1.6 oz (111.2 kg)     GENERAL: well appearing male in NAD  SKIN: skin color, texture, turgor are normal, no rashes or significant lesions EYES: conjunctiva are pink and non-injected, sclera clear OROPHARYNX: no exudate, no erythema; lips, buccal mucosa, and tongue normal  LUNGS: clear to auscultation and percussion with normal breathing effort HEART: regular rate & rhythm and no murmurs and no lower extremity edema Musculoskeletal: no cyanosis of digits and no clubbing  PSYCH: alert & oriented x 3, fluent speech NEURO: no focal motor/sensory deficits  LABORATORY DATA:  I have reviewed the data as listed    Latest Ref Rng & Units 05/08/2023   12:58 PM 02/05/2023   10:08 AM 11/07/2022   10:23 AM  CBC  WBC 4.0 - 10.5 K/uL 5.6  6.6  5.4    Hemoglobin 13.0 - 17.0 g/dL 95.6  21.3  08.6   Hematocrit 39.0 - 52.0 % 37.9  37.4  36.4   Platelets 150 - 400 K/uL 146  187  141        Latest Ref Rng & Units 05/08/2023   12:58 PM 02/05/2023   10:08 AM 11/07/2022   10:23 AM  CMP  Glucose 70 - 99 mg/dL 88  86  79   BUN 6 - 20 mg/dL 17  19  18    Creatinine 0.61 - 1.24 mg/dL 5.78  4.69  6.29   Sodium 135 - 145 mmol/L 140  140  140   Potassium 3.5 - 5.1 mmol/L 4.2  4.3  4.7   Chloride 98 - 111 mmol/L 106  105  108   CO2 22 - 32 mmol/L 30  29  28    Calcium 8.9 - 10.3 mg/dL 9.2  9.7  9.6   Total Protein 6.5 - 8.1 g/dL 7.2  7.2  7.1   Total Bilirubin 0.0 - 1.2 mg/dL 0.4  0.3  0.4   Alkaline Phos 38 - 126 U/L 54  67  66   AST 15 - 41 U/L 38  28  32   ALT 0 - 44 U/L 27  24  27      ASSESSMENT & PLAN Darrell Russo is a 48 y.o. male who presents for a follow up for continued management of CML. He will transfer his care from Dr. Clelia Croft to Dr. Leonides Schanz.   #CML, chronic phase: --Diagnosed in 2007 and started on Gleevac 400 mg in 2007 --Labs today show WBC 5.6, hemoglobin 12.7, MCV 97.9, platelets 146 K. Creatinine and LFTs normal. --Most recent BCR/ABL PCR levels from 11/07/2022  showed undetectable levels. Today's levels are pending --Discussed role for discontinuing Gleevac therapy since patient has been on therapy for >3 years. Reviewed relapse risk of 50%. Patient and mother would like to think about this before making a decision. If patient decides to proceed with surveillance we will check labs q 4 weeks x 6 months and then q 8 week for remaining 6 months.  --Tentative return in 3 months for lab check and 6 months for labs/follow up   No orders of the defined types were placed in this encounter.   All questions were answered. The patient knows to call the clinic with any problems, questions or concerns.  I have spent a total of 30 minutes minutes of face-to-face and non-face-to-face time, preparing to see the patient,performing a  medically appropriate examination, counseling and educating the patient, ordering tests/procedures,  documenting clinical information in the electronic health record, independently interpreting results and communicating results to the patient, and care coordination.   Ulysees Barns, MD Department of Hematology/Oncology [facility] at [facility] Phone: [phone] Pager: [phone] Email: [email or web address]

## 2023-05-09 ENCOUNTER — Other Ambulatory Visit: Payer: Medicare Other

## 2023-05-09 ENCOUNTER — Ambulatory Visit: Payer: Medicare Other | Admitting: Physician Assistant

## 2023-05-15 LAB — BCR-ABL1, CML/ALL, PCR, QUANT
E1A2 Transcript: <0.0032 %
Interpretation (BCRAL):: NEGATIVE
b2a2 transcript: <0.0032 %
b3a2 transcript: <0.0032 %

## 2023-05-17 ENCOUNTER — Telehealth: Payer: Self-pay

## 2023-05-17 NOTE — Telephone Encounter (Signed)
 Pt's mother advised with VU.

## 2023-05-17 NOTE — Telephone Encounter (Signed)
-----   Message from Briant Cedar sent at 05/17/2023  9:15 AM EDT ----- Please notify patient that BCR/ABL is note detectable. Blood counts are stable. ----- Message ----- From: Interface, Lab In [place] Sent: 05/08/2023   1:26 PM EDT To: Briant Cedar, PA-C

## 2023-08-06 ENCOUNTER — Inpatient Hospital Stay (HOSPITAL_BASED_OUTPATIENT_CLINIC_OR_DEPARTMENT_OTHER): Admitting: Hematology and Oncology

## 2023-08-06 ENCOUNTER — Inpatient Hospital Stay: Attending: Physician Assistant

## 2023-08-06 ENCOUNTER — Other Ambulatory Visit: Payer: Self-pay | Admitting: Hematology and Oncology

## 2023-08-06 VITALS — BP 141/81 | HR 60 | Temp 97.0°F | Resp 13 | Wt 243.5 lb

## 2023-08-06 DIAGNOSIS — C9210 Chronic myeloid leukemia, BCR/ABL-positive, not having achieved remission: Secondary | ICD-10-CM

## 2023-08-06 DIAGNOSIS — Z79899 Other long term (current) drug therapy: Secondary | ICD-10-CM | POA: Insufficient documentation

## 2023-08-06 DIAGNOSIS — F840 Autistic disorder: Secondary | ICD-10-CM | POA: Diagnosis not present

## 2023-08-06 LAB — CBC WITH DIFFERENTIAL (CANCER CENTER ONLY)
Abs Immature Granulocytes: 0 10*3/uL (ref 0.00–0.07)
Basophils Absolute: 0 10*3/uL (ref 0.0–0.1)
Basophils Relative: 0 %
Eosinophils Absolute: 0.2 10*3/uL (ref 0.0–0.5)
Eosinophils Relative: 3 %
HCT: 34.4 % — ABNORMAL LOW (ref 39.0–52.0)
Hemoglobin: 11.8 g/dL — ABNORMAL LOW (ref 13.0–17.0)
Immature Granulocytes: 0 %
Lymphocytes Relative: 47 %
Lymphs Abs: 2.3 10*3/uL (ref 0.7–4.0)
MCH: 33.1 pg (ref 26.0–34.0)
MCHC: 34.3 g/dL (ref 30.0–36.0)
MCV: 96.4 fL (ref 80.0–100.0)
Monocytes Absolute: 0.6 10*3/uL (ref 0.1–1.0)
Monocytes Relative: 12 %
Neutro Abs: 1.9 10*3/uL (ref 1.7–7.7)
Neutrophils Relative %: 38 %
Platelet Count: 100 10*3/uL — ABNORMAL LOW (ref 150–400)
RBC: 3.57 MIL/uL — ABNORMAL LOW (ref 4.22–5.81)
RDW: 15 % (ref 11.5–15.5)
WBC Count: 4.9 10*3/uL (ref 4.0–10.5)
nRBC: 0 % (ref 0.0–0.2)

## 2023-08-06 LAB — CMP (CANCER CENTER ONLY)
ALT: 24 U/L (ref 0–44)
AST: 31 U/L (ref 15–41)
Albumin: 4.2 g/dL (ref 3.5–5.0)
Alkaline Phosphatase: 61 U/L (ref 38–126)
Anion gap: 4 — ABNORMAL LOW (ref 5–15)
BUN: 13 mg/dL (ref 6–20)
CO2: 28 mmol/L (ref 22–32)
Calcium: 9.5 mg/dL (ref 8.9–10.3)
Chloride: 108 mmol/L (ref 98–111)
Creatinine: 1.33 mg/dL — ABNORMAL HIGH (ref 0.61–1.24)
GFR, Estimated: >60 mL/min (ref >60)
Glucose, Bld: 92 mg/dL (ref 70–99)
Potassium: 4.5 mmol/L (ref 3.5–5.1)
Sodium: 140 mmol/L (ref 135–145)
Total Bilirubin: 0.4 mg/dL (ref 0.0–1.2)
Total Protein: 7 g/dL (ref 6.5–8.1)

## 2023-08-06 LAB — LACTATE DEHYDROGENASE: LDH: 245 U/L — ABNORMAL HIGH (ref 98–192)

## 2023-08-06 NOTE — Progress Notes (Unsigned)
 [facility] [phone]   [phone]  PROGRESS NOTE  Patient Care Team: Yvonnie Heritage, [name] as PCP - General (Nurse Practitioner)  CHIEF COMPLAINTS/PURPOSE OF CONSULTATION:  Chronic Myeloid Leukemia, chronic phase  CURRENT TREATMENT: Gleevac 100 mg PO daily   HISTORY OF PRESENTING ILLNESS:  Darrell Russo 48 y.o. 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 He was last seen on 05/08/2023. He is accompanied by his mother for this visit.   On exam today, Darrell Russo reports he has been well overall in interim since her last visit.  He reports he safely takes his imatinib  therapy.  He did recently have some changes in his thyroid  medication.  He notes that he is taking the medication as prescribed with no nausea, vomiting, or diarrhea.  He has been taking it now for 18 years.  His energy and appetite are both strong.  He is not having any fevers, chills, sweats.  He denies any bleeding, bruising, or dark stools.  Overall he feels well and is willing and able to continue on the Gleevec  therapy at this time.  We discussed discontinuing the medication as he has been on it for so long, but he would like to continue for now.  A full 10 point ROS is otherwise negative.  MEDICAL HISTORY:  Past Medical History:  Diagnosis Date   Autism    Folliculitis    Hypertension    Impulse control disorder    Leukemia (HCC)    TMJ (dislocation of temporomandibular joint)     SURGICAL HISTORY: Past Surgical History:  Procedure Laterality Date   ANKLE SURGERY     l/ankle fracture 5 years ago   ORIF HIP FRACTURE Right 12/13/2016   Procedure: ORIF of Right Femoral neck fracture;  Surgeon: Laneta Pintos, MD;  Location: [facility] OR;  Service: Orthopedics;  Laterality: Right;    SOCIAL HISTORY: Social History   Socioeconomic History   Marital status: Single    Spouse name: Not on file   Number of children: Not on file    Years of education: Not on file   Highest education level: Not on file  Occupational History   Not on file  Tobacco Use   Smoking status: Never   Smokeless tobacco: Never  Substance and Sexual Activity   Alcohol use: No   Drug use: No   Sexual activity: Not on file  Other Topics Concern   Not on file  Social History Narrative   Rare coffee or soda   Social Drivers of Health   Financial Resource Strain: Low Risk  (02/08/2023)   Received from [facility]   Overall Financial Resource Strain (CARDIA)    Difficulty of Paying Living Expenses: Not very hard  Food Insecurity: No Food Insecurity (02/08/2023)   Received from [facility]   Hunger Vital Sign    Worried About Running Out of Food in the Last Year: Never true    Ran Out of Food in the Last Year: Never true  Transportation Needs: No Transportation Needs (02/08/2023)   Received from [facility] - Transportation    Lack of Transportation (Medical): No    Lack of Transportation (Non-Medical): No  Physical Activity: Unknown (02/08/2023)   Received from [facility]   Exercise Vital Sign    Days of Exercise per Week: 0 days    Minutes of Exercise per Session: Not on file  Stress: No Stress Concern  Present (02/08/2023)   Received from [facility] - Occupational Stress Questionnaire    Feeling of Stress : Not at all  Social Connections: Somewhat Isolated (02/08/2023)   Received from [facility]   Social Network    How would you rate your social network (family, work, friends)?: Restricted participation with some degree of social isolation  Intimate Partner Violence: Not At Risk (02/08/2023)   Received from [facility]   HITS    Over the last 12 months how often did your partner physically hurt you?: Never    Over the last 12 months how often did your partner insult you or talk down to you?: Never    Over the last 12 months how often did your partner threaten  you with physical harm?: Never    Over the last 12 months how often did your partner scream or curse at you?: Never    FAMILY HISTORY: No family history on file.  ALLERGIES:  has no known allergies.  MEDICATIONS:  Current Outpatient Medications  Medication Sig Dispense Refill   divalproex  (DEPAKOTE  SPRINKLE) 125 MG capsule Take 1 capsule (125 mg total) by mouth every 12 (twelve) hours. 180 capsule 3   divalproex  (DEPAKOTE ) 500 MG DR tablet Take 1 tablet (500 mg total) by mouth every 12 (twelve) hours. 180 tablet 3   doxepin (SINEQUAN) 25 MG capsule Take 25 mg by mouth at bedtime as needed (Itching). (Patient not taking: Reported on 11/07/2022)     Emollient (CERAVE MOISTURIZING EX) Apply topically. TID AS NEEDED     Emollient (CETAPHIL) cream Apply 1 Application topically 2 (two) times daily.     furosemide  (LASIX ) 20 MG tablet Take 20 mg by mouth daily.     GAVILAX 17 GM/SCOOP powder Take by mouth.     haloperidol  (HALDOL ) 0.5 MG tablet Take 1.5 mg by mouth at bedtime.     hydrOXYzine (ATARAX) 25 MG tablet Take 25 mg by mouth 3 (three) times daily.     imatinib  (GLEEVEC ) 100 MG tablet Take 1 tablet (100 mg total) by mouth 2 (two) times daily.     levothyroxine  (SYNTHROID ) 200 MCG tablet Take 200 mcg by mouth daily.     meloxicam (MOBIC) 15 MG tablet Take 15 mg by mouth daily.     metoprolol  (TOPROL -XL) 50 MG 24 hr tablet Take 50 mg by mouth daily.       miconazole (MICOTIN) 2 % powder Apply 1 Application topically 2 (two) times daily.     minocycline  (MINOCIN ,DYNACIN ) 100 MG capsule Take 100 mg by mouth 2 (two) times daily.       nystatin cream (MYCOSTATIN) Apply 1 application topically at bedtime.     polyethylene glycol (MIRALAX  / GLYCOLAX ) 17 g packet Take 17 g by mouth daily.     senna (SENOKOT) 8.6 MG TABS tablet Take 1 tablet by mouth daily as needed for mild constipation.     triamcinolone ointment (KENALOG) 0.1 % Apply 1 Application topically 2 (two) times daily.     vitamin D3  (CHOLECALCIFEROL) 25 MCG tablet Take 1,000 Units by mouth daily.     zonisamide  (ZONEGRAN ) 100 MG capsule Take 3 capsules (300 mg total) by mouth at bedtime.     No current facility-administered medications for this visit.    REVIEW OF SYSTEMS:   Constitutional: ( - ) fevers, ( - )  chills , ( - ) night sweats Eyes: ( - ) blurriness of vision, ( - )  double vision, ( - ) watery eyes Ears, nose, mouth, throat, and face: ( - ) mucositis, ( - ) sore throat Respiratory: ( - ) cough, ( - ) dyspnea, ( - ) wheezes Cardiovascular: ( - ) palpitation, ( - ) chest discomfort, ( - ) lower extremity swelling Gastrointestinal:  ( - ) nausea, ( - ) heartburn, ( - ) change in bowel habits Skin: ( - ) abnormal skin rashes Lymphatics: ( - ) new lymphadenopathy, ( - ) easy bruising Neurological: ( - ) numbness, ( - ) tingling, ( - ) new weaknesses Behavioral/Psych: ( - ) mood change, ( - ) new changes  All other systems were reviewed with the patient and are negative.  PHYSICAL EXAMINATION: ECOG PERFORMANCE STATUS: 0 - Asymptomatic  Vitals:   08/06/23 1037  BP: (!) 141/81  Pulse: 60  Resp: 13  Temp: (!) 97 F (36.1 C)  SpO2: 100%   Filed Weights   08/06/23 1037  Weight: 243 lb 8 oz (110.5 kg)     GENERAL: well appearing male in NAD  SKIN: skin color, texture, turgor are normal, no rashes or significant lesions EYES: conjunctiva are pink and non-injected, sclera clear OROPHARYNX: no exudate, no erythema; lips, buccal mucosa, and tongue normal  LUNGS: clear to auscultation and percussion with normal breathing effort HEART: regular rate & rhythm and no murmurs and no lower extremity edema Musculoskeletal: no cyanosis of digits and no clubbing  PSYCH: alert & oriented x 3, fluent speech NEURO: no focal motor/sensory deficits  LABORATORY DATA:  I have reviewed the data as listed    Latest Ref Rng & Units 08/06/2023   10:11 AM 05/08/2023   12:58 PM 02/05/2023   10:08 AM  CBC  WBC 4.0 - 10.5  K/uL 4.9  5.6  6.6   Hemoglobin 13.0 - 17.0 g/dL 78.2  95.6  21.3   Hematocrit 39.0 - 52.0 % 34.4  37.9  37.4   Platelets 150 - 400 K/uL 100  146  187        Latest Ref Rng & Units 08/06/2023   10:11 AM 05/08/2023   12:58 PM 02/05/2023   10:08 AM  CMP  Glucose 70 - 99 mg/dL 92  88  86   BUN 6 - 20 mg/dL 13  17  19    Creatinine 0.61 - 1.24 mg/dL 0.86  5.78  4.69   Sodium 135 - 145 mmol/L 140  140  140   Potassium 3.5 - 5.1 mmol/L 4.5  4.2  4.3   Chloride 98 - 111 mmol/L 108  106  105   CO2 22 - 32 mmol/L 28  30  29    Calcium 8.9 - 10.3 mg/dL 9.5  9.2  9.7   Total Protein 6.5 - 8.1 g/dL 7.0  7.2  7.2   Total Bilirubin 0.0 - 1.2 mg/dL 0.4  0.4  0.3   Alkaline Phos 38 - 126 U/L 61  54  67   AST 15 - 41 U/L 31  38  28   ALT 0 - 44 U/L 24  27  24      ASSESSMENT & PLAN SABEN DONIGAN is a 48 y.o. male who presents for a follow up for continued management of CML. He will transfer his care from Dr. Dirk Fredericks to Dr. Rosaline Coma.   #CML, chronic phase: --Diagnosed in 2007 and started on Gleevac 400 mg in 2007 --Labs today show WBC 4.9, Hgb 11.8, MCV 96.4, 100 K. Creatinine and LFTs  normal. --Most recent BCR/ABL PCR levels from 11/07/2022 showed undetectable levels. Today's levels are pending --Discussed role for discontinuing Gleevac therapy since patient has been on therapy for >3 years. Reviewed relapse risk of 50%. Patient and mother would like to think about this before making a decision. If patient decides to proceed with surveillance we will check labs q 4 weeks x 6 months and then q 8 week for remaining 6 months.  --Tentative return in 3 months for labs/follow up   No orders of the defined types were placed in this encounter.   All questions were answered. The patient knows to call the clinic with any problems, questions or concerns.  I have spent a total of 30 minutes minutes of face-to-face and non-face-to-face time, preparing to see the patient,performing a medically appropriate  examination, counseling and educating the patient, ordering tests/procedures,  documenting clinical information in the electronic health record, independently interpreting results and communicating results to the patient, and care coordination.   Rogerio Clay, MD Department of Hematology/Oncology [facility] Phone: [phone] Pager: [phone] Email: [email or web address]

## 2023-08-09 LAB — BCR-ABL1, CML/ALL, PCR, QUANT
E1A2 Transcript: <0.0032 %
Interpretation (BCRAL):: NEGATIVE
b2a2 transcript: <0.0032 %
b3a2 transcript: <0.0032 %

## 2023-08-15 ENCOUNTER — Ambulatory Visit: Payer: Self-pay | Admitting: *Deleted

## 2023-08-15 NOTE — Telephone Encounter (Signed)
 TCT patient legal guardian, Oksana Bergamo. No answer but was able to leave vm on her identified phone vm.  Advised that his CML labs remain undetectable. We will plan to see him back as scheduled in 3 months

## 2023-08-15 NOTE — Telephone Encounter (Signed)
-----   Message from Rogerio Clay IV sent at 08/12/2023  7:07 PM EDT ----- Please let Mr. Calvey know (via his sister) that his CML labs remain undetectable.  We will plan to see him back as scheduled in 3 months ----- Message ----- From: Interface, Lab In [place] Sent: 08/06/2023  10:25 AM EDT To: Ander Bame, MD

## 2023-10-21 NOTE — Progress Notes (Unsigned)
 No chief complaint on file.   HISTORY OF PRESENT ILLNESS:  10/21/23 ALL:  Darrell Russo is a 48 y.o. male here today for follow up for seizures. He was last seen in consult with Dr Gregg 09/2022 following a breakthrough seizure 07/2022. He was doing well on increased dose of divalproex  to 625mg  BID.    HISTORY (copied from Dr Janean previous note)  This is a 48 year old man with past medical history of autism, intellectual disability, hypertension, seizure disorder who is presenting after a possible seizure in June.  Patient lives in a group home, per group home caregiver, Cheryal they have not seen a seizure in the past 10 years.  On June 18 he was found on the bathroom floor, lethargic and sleepy.  He was taken to the hospital.  His prolactin level was elevated.  His EEG showed intermittent slowing.  It was thought that patient did have a breakthrough seizure, his Depakote  was increased from 500 mg twice daily to 625 mg twice daily.  Since then, he has been back to his normal self, compliant with the medications.  No other complaints, no other concerns per parent.     Handedness: Right    Onset: Unknown    Seizure Type: Unknown, he was found down    Current frequency: 6/18, previous seizure was more than 10 years ago    Any injuries from seizures: Denies    Seizure risk factors: Autism    Previous ASMs: Valproic  acid, Zonisamide    Currenty ASMs: Valproic  acid 625 mg BID, Zonisamide  300 mg nightly    ASMs side effects: Denies    Brain Images: No acute abnormality    Previous EEGs: Intermittent slowing      OTHER MEDICAL CONDITIONS: Autism, Intellectual disability, Seizures, Hypertension   REVIEW OF SYSTEMS: Out of a complete 14 system review of symptoms, the patient complains only of the following symptoms, and all other reviewed systems are negative.   ALLERGIES: No Known Allergies   HOME MEDICATIONS: Outpatient Medications Prior to Visit  Medication Sig  Dispense Refill   divalproex  (DEPAKOTE  SPRINKLE) 125 MG capsule Take 1 capsule (125 mg total) by mouth every 12 (twelve) hours. 180 capsule 3   divalproex  (DEPAKOTE ) 500 MG DR tablet Take 1 tablet (500 mg total) by mouth every 12 (twelve) hours. 180 tablet 3   doxepin (SINEQUAN) 25 MG capsule Take 25 mg by mouth at bedtime as needed (Itching). (Patient not taking: Reported on 11/07/2022)     Emollient (CERAVE MOISTURIZING EX) Apply topically. TID AS NEEDED     Emollient (CETAPHIL) cream Apply 1 Application topically 2 (two) times daily.     furosemide  (LASIX ) 20 MG tablet Take 20 mg by mouth daily.     GAVILAX 17 GM/SCOOP powder Take by mouth.     haloperidol  (HALDOL ) 0.5 MG tablet Take 1.5 mg by mouth at bedtime.     hydrOXYzine (ATARAX) 25 MG tablet Take 25 mg by mouth 3 (three) times daily.     imatinib  (GLEEVEC ) 100 MG tablet Take 1 tablet (100 mg total) by mouth 2 (two) times daily.     levothyroxine  (SYNTHROID ) 200 MCG tablet Take 200 mcg by mouth daily.     meloxicam (MOBIC) 15 MG tablet Take 15 mg by mouth daily.     metoprolol  (TOPROL -XL) 50 MG 24 hr tablet Take 50 mg by mouth daily.       miconazole (MICOTIN) 2 % powder Apply 1 Application topically 2 (two) times daily.  minocycline  (MINOCIN ,DYNACIN ) 100 MG capsule Take 100 mg by mouth 2 (two) times daily.       nystatin cream (MYCOSTATIN) Apply 1 application topically at bedtime.     polyethylene glycol (MIRALAX  / GLYCOLAX ) 17 g packet Take 17 g by mouth daily.     senna (SENOKOT) 8.6 MG TABS tablet Take 1 tablet by mouth daily as needed for mild constipation.     triamcinolone ointment (KENALOG) 0.1 % Apply 1 Application topically 2 (two) times daily.     vitamin D3 (CHOLECALCIFEROL) 25 MCG tablet Take 1,000 Units by mouth daily.     zonisamide  (ZONEGRAN ) 100 MG capsule Take 3 capsules (300 mg total) by mouth at bedtime.     No facility-administered medications prior to visit.     PAST MEDICAL HISTORY: Past Medical History:   Diagnosis Date   Autism    Folliculitis    Hypertension    Impulse control disorder    Leukemia (HCC)    TMJ (dislocation of temporomandibular joint)      PAST SURGICAL HISTORY: Past Surgical History:  Procedure Laterality Date   ANKLE SURGERY     l/ankle fracture 5 years ago   ORIF HIP FRACTURE Right 12/13/2016   Procedure: ORIF of Right Femoral neck fracture;  Surgeon: Kendal Franky SQUIBB, MD;  Location: [facility] OR;  Service: Orthopedics;  Laterality: Right;     FAMILY HISTORY: No family history on file.   SOCIAL HISTORY: Social History   Socioeconomic History   Marital status: Single    Spouse name: Not on file   Number of children: Not on file   Years of education: Not on file   Highest education level: Not on file  Occupational History   Not on file  Tobacco Use   Smoking status: Never   Smokeless tobacco: Never  Substance and Sexual Activity   Alcohol use: No   Drug use: No   Sexual activity: Not on file  Other Topics Concern   Not on file  Social History Narrative   Rare coffee or soda   Social Drivers of Health   Financial Resource Strain: Low Risk  (02/08/2023)   Received from [facility]   Overall Financial Resource Strain (CARDIA)    Difficulty of Paying Living Expenses: Not very hard  Food Insecurity: No Food Insecurity (02/08/2023)   Received from [facility]   Hunger Vital Sign    Within the past 12 months, you worried that your food would run out before you got the money to buy more.: Never true    Within the past 12 months, the food you bought just didn't last and you didn't have money to get more.: Never true  Transportation Needs: No Transportation Needs (02/08/2023)   Received from [facility] - Transportation    Lack of Transportation (Medical): No    Lack of Transportation (Non-Medical): No  Physical Activity: Unknown (02/08/2023)   Received from [facility]   Exercise Vital Sign    On average, how many days per week do  you engage in moderate to strenuous exercise (like a brisk walk)?: 0 days    Minutes of Exercise per Session: Not on file  Stress: No Stress Concern Present (02/08/2023)   Received from [facility] of Occupational Health - Occupational Stress Questionnaire    Feeling of Stress : Not at all  Social Connections: Somewhat Isolated (02/08/2023)   Received from [facility]   Social Network  How would you rate your social network (family, work, friends)?: Restricted participation with some degree of social isolation  Intimate Partner Violence: Not At Risk (02/08/2023)   Received from [facility]   HITS    Over the last 12 months how often did your partner physically hurt you?: Never    Over the last 12 months how often did your partner insult you or talk down to you?: Never    Over the last 12 months how often did your partner threaten you with physical harm?: Never    Over the last 12 months how often did your partner scream or curse at you?: Never     PHYSICAL EXAM  There were no vitals filed for this visit. There is no height or weight on file to calculate BMI.  Generalized: Well developed, in no acute distress  Cardiology: normal rate and rhythm, no murmur auscultated  Respiratory: clear to auscultation bilaterally    Neurological examination  Mentation: Alert oriented to time, place, history taking. Follows all commands speech and language fluent Cranial nerve II-XII: Pupils were equal round reactive to light. Extraocular movements were full, visual field were full on confrontational test. Facial sensation and strength were normal. Uvula tongue midline. Head turning and shoulder shrug  were normal and symmetric. Motor: The motor testing reveals 5 over 5 strength of all 4 extremities. Good symmetric motor tone is noted throughout.  Sensory: Sensory testing is intact to soft touch on all 4 extremities. No evidence of extinction is noted.  Coordination:  Cerebellar testing reveals good finger-nose-finger and heel-to-shin bilaterally.  Gait and station: Gait is normal. Tandem gait is normal. Romberg is negative. No drift is seen.  Reflexes: Deep tendon reflexes are symmetric and normal bilaterally.    DIAGNOSTIC DATA (LABS, IMAGING, TESTING) - I reviewed patient records, labs, notes, testing and imaging myself where available.  Lab Results  Component Value Date   WBC 4.9 08/06/2023   HGB 11.8 (L) 08/06/2023   HCT 34.4 (L) 08/06/2023   MCV 96.4 08/06/2023   PLT 100 (L) 08/06/2023      Component Value Date/Time   NA 140 08/06/2023 1011   NA 141 12/01/2016 0830   K 4.5 08/06/2023 1011   K 4.3 12/01/2016 0830   CL 108 08/06/2023 1011   CL 98 05/28/2012 1453   CO2 28 08/06/2023 1011   CO2 27 12/01/2016 0830   GLUCOSE 92 08/06/2023 1011   GLUCOSE 118 12/01/2016 0830   GLUCOSE 81 05/28/2012 1453   BUN 13 08/06/2023 1011   BUN 26.0 12/01/2016 0830   CREATININE 1.33 (H) 08/06/2023 1011   CREATININE 1.4 (H) 12/01/2016 0830   CALCIUM 9.5 08/06/2023 1011   CALCIUM 9.9 12/01/2016 0830   PROT 7.0 08/06/2023 1011   PROT 7.0 10/27/2022 1038   PROT 7.4 12/01/2016 0830   ALBUMIN  4.2 08/06/2023 1011   ALBUMIN  4.1 10/27/2022 1038   ALBUMIN  4.0 12/01/2016 0830   AST 31 08/06/2023 1011   AST 38 (H) 12/01/2016 0830   ALT 24 08/06/2023 1011   ALT 26 12/01/2016 0830   ALKPHOS 61 08/06/2023 1011   ALKPHOS 85 12/01/2016 0830   BILITOT 0.4 08/06/2023 1011   BILITOT 0.35 12/01/2016 0830   GFRNONAA >60 08/06/2023 1011   GFRAA >60 11/05/2019 1307   No results found for: CHOL, HDL, LDLCALC, LDLDIRECT, TRIG, CHOLHDL No results found for: YHAJ8R No results found for: VITAMINB12 Lab Results  Component Value Date   TSH 1.170 08/16/2022  No data to display               No data to display           ASSESSMENT AND PLAN  48 y.o. year old male  has a past medical history of Autism, Folliculitis,  Hypertension, Impulse control disorder, Leukemia (HCC), and TMJ (dislocation of temporomandibular joint). here with    No diagnosis found.  Fairy CHRISTELLA Lowers ***.  Healthy lifestyle habits encouraged. *** will follow up with PCP as directed. *** will return to see me in ***, sooner if needed. *** verbalizes understanding and agreement with this plan.   No orders of the defined types were placed in this encounter.    No orders of the defined types were placed in this encounter.    Greig Forbes, MSN, [name]-C 10/21/2023, 7:29 PM  [facility] [address], [address] [place], [place] [address] [phone]

## 2023-10-21 NOTE — Patient Instructions (Signed)
 Below is our plan:  We will continue divalproex  625mg  twice daily   Please make sure you are consistent with timing of seizure medication. I recommend annual visit with primary care provider (PCP) for complete physical and routine blood work. I recommend daily intake of vitamin D  (400-800iu) and calcium (800-1000mg ) for bone health. Discuss Dexa screening with PCP.   Please maintain precautions. Do not participate in activities where a loss of awareness could harm you or someone else. No swimming alone, no tub bathing, no hot tubs, no driving, no operating motorized vehicles (cars, ATVs, motocycles, etc), lawnmowers, power tools or firearms. No standing at heights, such as rooftops, ladders or stairs. Avoid hot objects such as stoves, heaters, open fires. Wear a helmet when riding a bicycle, scooter, skateboard, etc. and avoid areas of traffic. Set your water heater to 120 degrees or less.  SUDEP is the sudden, unexpected death of someone with epilepsy, who was otherwise healthy. In SUDEP cases, no other cause of death is found when an autopsy is done. Each year, more than 1 in 1,000 people with epilepsy die from SUDEP. This is the leading cause of death in people with uncontrolled seizures. Until further answers are available, the best way to prevent SUDEP is to lower your risk by controlling seizures. Research has found that people with all types of epilepsy that experience convulsive seizures can be at risk.  Please make sure you are staying well hydrated. I recommend 50-60 ounces daily. Well balanced diet and regular exercise encouraged. Consistent sleep schedule with 6-8 hours recommended.   Please continue follow up with care team as directed.   Follow up with me in 1 year   You may receive a survey regarding today's visit. I encourage you to leave honest feed back as I do use this information to improve patient care. Thank you for seeing me today!

## 2023-10-22 ENCOUNTER — Ambulatory Visit (INDEPENDENT_AMBULATORY_CARE_PROVIDER_SITE_OTHER): Payer: Medicare Other | Admitting: Family Medicine

## 2023-10-22 ENCOUNTER — Encounter: Payer: Self-pay | Admitting: Family Medicine

## 2023-10-22 VITALS — BP 118/80 | HR 58 | Ht 73.0 in | Wt 248.8 lb

## 2023-10-22 DIAGNOSIS — F840 Autistic disorder: Secondary | ICD-10-CM | POA: Diagnosis not present

## 2023-10-22 DIAGNOSIS — Z5181 Encounter for therapeutic drug level monitoring: Secondary | ICD-10-CM

## 2023-10-22 DIAGNOSIS — R569 Unspecified convulsions: Secondary | ICD-10-CM | POA: Diagnosis not present

## 2023-10-22 MED ORDER — DIVALPROEX SODIUM 125 MG PO CSDR: 125.0000 mg | DELAYED_RELEASE_CAPSULE | Freq: Two times a day (BID) | ORAL | 3 refills | Status: AC

## 2023-10-22 MED ORDER — DIVALPROEX SODIUM 500 MG PO DR TAB: 500.0000 mg | DELAYED_RELEASE_TABLET | Freq: Two times a day (BID) | ORAL | 3 refills | Status: AC

## 2023-10-24 ENCOUNTER — Ambulatory Visit: Payer: Self-pay | Admitting: Family Medicine

## 2023-10-24 LAB — HEPATIC FUNCTION PANEL
ALT: 28 IU/L (ref 0–44)
AST: 39 IU/L (ref 0–40)
Albumin: 4.3 g/dL (ref 4.1–5.1)
Alkaline Phosphatase: 74 IU/L (ref 44–121)
Bilirubin Total: 0.3 mg/dL (ref 0.0–1.2)
Bilirubin, Direct: 0.12 mg/dL (ref 0.00–0.40)
Total Protein: 6.9 g/dL (ref 6.0–8.5)

## 2023-10-24 LAB — VALPROIC ACID LEVEL: Valproic Acid Lvl: 97 ug/mL (ref 50–100)

## 2023-10-30 NOTE — Telephone Encounter (Signed)
 Called pt at [phone]. Spoke w/ Lonell HERO, RN w/ pt group home. Relayed results. She verbalized understanding. Faxed copy of results to [phone]. Received fax confirmation.

## 2023-11-04 ENCOUNTER — Other Ambulatory Visit: Payer: Self-pay | Admitting: Hematology and Oncology

## 2023-11-04 DIAGNOSIS — C9210 Chronic myeloid leukemia, BCR/ABL-positive, not having achieved remission: Secondary | ICD-10-CM

## 2023-11-04 NOTE — Progress Notes (Unsigned)
 [facility] Telephone:([phone]   [phone]) [phone]  PROGRESS NOTE  Patient Care Team: Lenon Boyer, [name] as PCP - General (Nurse Practitioner)  CHIEF COMPLAINTS/PURPOSE OF CONSULTATION:  Chronic Myeloid Leukemia, chronic phase  CURRENT TREATMENT: Gleevac 400 mg PO daily   HISTORY OF PRESENTING ILLNESS:  Darrell Russo 48 y.o. 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 He was last seen on 08/06/2023.   On exam today, Darrell Russo reports he has been feeling good in the interim since her last visit 3 months ago.  He has had no hospitalizations, ER visits, or new medications.  He reports has had no recent illnesses such as runny nose, sore throat, cough.  He denies any fevers, chills, sweats.  He reports his energy levels are good and he is able to do all the activity he wants to do at his facility.  [facility] does [facility] which [facility] is looking forward to.  He reports he is taking his Gleevec  200 mg in the morning and 200 mg at night.  He has not been having any difficulty with nausea, vomiting, or diarrhea.  He reports his appetite is strong.  He denies any bleeding, bruising, or dark stools.  Overall he feels quite well and has no additional questions concerns or complaints today.  Full 10 point ROS is otherwise negative.  MEDICAL HISTORY:  Past Medical History:  Diagnosis Date   Autism    Folliculitis    Hypertension    Impulse control disorder    Leukemia (HCC)    TMJ (dislocation of temporomandibular joint)     SURGICAL HISTORY: Past Surgical History:  Procedure Laterality Date   ANKLE SURGERY     l/ankle fracture 5 years ago   ORIF HIP FRACTURE Right 12/13/2016   Procedure: ORIF of Right Femoral neck fracture;  Surgeon: Kendal Franky SQUIBB, MD;  Location: [facility] OR;  Service: Orthopedics;  Laterality: Right;    SOCIAL HISTORY: Social History   Socioeconomic History   Marital status:  Single    Spouse name: Not on file   Number of children: Not on file   Years of education: Not on file   Highest education level: Not on file  Occupational History   Not on file  Tobacco Use   Smoking status: Never   Smokeless tobacco: Never  Substance and Sexual Activity   Alcohol use: No   Drug use: No   Sexual activity: Not on file  Other Topics Concern   Not on file  Social History Narrative   Rare coffee or soda   Social Drivers of Health   Financial Resource Strain: Low Risk  (02/08/2023)   Received from [facility]   Overall Financial Resource Strain (CARDIA)    Difficulty of Paying Living Expenses: Not very hard  Food Insecurity: No Food Insecurity (02/08/2023)   Received from [facility]   Hunger Vital Sign    Within the past 12 months, you worried that your food would run out before you got the money to buy more.: Never true    Within the past 12 months, the food you bought just didn't last and you didn't have money to get more.: Never true  Transportation Needs: No Transportation Needs (02/08/2023)   Received from [facility] - Transportation    Lack of Transportation (Medical): No    Lack of Transportation (Non-Medical): No  Physical Activity: Unknown (  02/08/2023)   Received from [facility]   Exercise Vital Sign    On average, how many days per week do you engage in moderate to strenuous exercise (like a brisk walk)?: 0 days    Minutes of Exercise per Session: Not on file  Stress: No Stress Concern Present (02/08/2023)   Received from [facility] - Occupational Stress Questionnaire    Feeling of Stress : Not at all  Social Connections: Somewhat Isolated (02/08/2023)   Received from [facility]   Social Network    How would you rate your social network (family, work, friends)?: Restricted participation with some degree of social isolation  Intimate Partner Violence: Not At Risk (02/08/2023)    Received from [facility]   HITS    Over the last 12 months how often did your partner physically hurt you?: Never    Over the last 12 months how often did your partner insult you or talk down to you?: Never    Over the last 12 months how often did your partner threaten you with physical harm?: Never    Over the last 12 months how often did your partner scream or curse at you?: Never    FAMILY HISTORY: No family history on file.  ALLERGIES:  has no known allergies.  MEDICATIONS:  Current Outpatient Medications  Medication Sig Dispense Refill   imatinib  (GLEEVEC ) 100 MG tablet Take 2 tablets (200 mg total) by mouth 2 (two) times daily. Take with meals and large glass of water.Caution:Chemotherapy 120 tablet 3   divalproex  (DEPAKOTE  SPRINKLE) 125 MG capsule Take 1 capsule (125 mg total) by mouth every 12 (twelve) hours. 180 capsule 3   divalproex  (DEPAKOTE ) 500 MG DR tablet Take 1 tablet (500 mg total) by mouth every 12 (twelve) hours. 180 tablet 3   furosemide  (LASIX ) 20 MG tablet Take 20 mg by mouth daily.     GAVILAX 17 GM/SCOOP powder Take by mouth.     haloperidol  (HALDOL ) 0.5 MG tablet Take 1.5 mg by mouth at bedtime.     hydrOXYzine (ATARAX) 25 MG tablet Take 25 mg by mouth 3 (three) times daily. (Patient taking differently: Take 25 mg by mouth 3 (three) times daily as needed.)     levothyroxine  (SYNTHROID ) 200 MCG tablet Take 200 mcg by mouth daily.     meloxicam (MOBIC) 15 MG tablet Take 15 mg by mouth daily.     metoprolol  (TOPROL -XL) 50 MG 24 hr tablet Take 50 mg by mouth daily.       miconazole (MICOTIN) 2 % powder Apply 1 Application topically 2 (two) times daily.     minocycline  (MINOCIN ,DYNACIN ) 100 MG capsule Take 100 mg by mouth 2 (two) times daily.       nystatin cream (MYCOSTATIN) Apply 1 application topically at bedtime.     senna (SENOKOT) 8.6 MG TABS tablet Take 1 tablet by mouth daily as needed for mild constipation.     vitamin D3 (CHOLECALCIFEROL) 25 MCG tablet  Take 1,000 Units by mouth daily.     zonisamide  (ZONEGRAN ) 100 MG capsule Take 3 capsules (300 mg total) by mouth at bedtime.     No current facility-administered medications for this visit.    REVIEW OF SYSTEMS:   Constitutional: ( - ) fevers, ( - )  chills , ( - ) night sweats Eyes: ( - ) blurriness of vision, ( - ) double vision, ( - ) watery eyes Ears, nose, mouth, throat,  and face: ( - ) mucositis, ( - ) sore throat Respiratory: ( - ) cough, ( - ) dyspnea, ( - ) wheezes Cardiovascular: ( - ) palpitation, ( - ) chest discomfort, ( - ) lower extremity swelling Gastrointestinal:  ( - ) nausea, ( - ) heartburn, ( - ) change in bowel habits Skin: ( - ) abnormal skin rashes Lymphatics: ( - ) new lymphadenopathy, ( - ) easy bruising Neurological: ( - ) numbness, ( - ) tingling, ( - ) new weaknesses Behavioral/Psych: ( - ) mood change, ( - ) new changes  All other systems were reviewed with the patient and are negative.  PHYSICAL EXAMINATION: ECOG PERFORMANCE STATUS: 0 - Asymptomatic  Vitals:   11/05/23 1017  BP: 128/72  Pulse: 65  Resp: 17  Temp: (!) 97.3 F (36.3 C)  SpO2: 100%    Filed Weights   11/05/23 1017  Weight: 246 lb (111.6 kg)      GENERAL: well appearing male in NAD  SKIN: skin color, texture, turgor are normal, no rashes or significant lesions EYES: conjunctiva are pink and non-injected, sclera clear OROPHARYNX: no exudate, no erythema; lips, buccal mucosa, and tongue normal  LUNGS: clear to auscultation and percussion with normal breathing effort HEART: regular rate & rhythm and no murmurs and no lower extremity edema Musculoskeletal: no cyanosis of digits and no clubbing  PSYCH: alert & oriented x 3, fluent speech NEURO: no focal motor/sensory deficits  LABORATORY DATA:  I have reviewed the data as listed    Latest Ref Rng & Units 11/05/2023    9:48 AM 08/06/2023   10:11 AM 05/08/2023   12:58 PM  CBC  WBC 4.0 - 10.5 K/uL 5.0  4.9  5.6   Hemoglobin  13.0 - 17.0 g/dL 87.1  88.1  87.2   Hematocrit 39.0 - 52.0 % 37.0  34.4  37.9   Platelets 150 - 400 K/uL 110  100  146        Latest Ref Rng & Units 11/05/2023    9:48 AM 10/22/2023   11:30 AM 08/06/2023   10:11 AM  CMP  Glucose 70 - 99 mg/dL 62   92   BUN 6 - 20 mg/dL 23   13   Creatinine 9.38 - 1.24 mg/dL 8.83   8.66   Sodium 864 - 145 mmol/L 142   140   Potassium 3.5 - 5.1 mmol/L 3.9   4.5   Chloride 98 - 111 mmol/L 110   108   CO2 22 - 32 mmol/L 27   28   Calcium 8.9 - 10.3 mg/dL 9.5   9.5   Total Protein 6.5 - 8.1 g/dL 7.2  6.9  7.0   Total Bilirubin 0.0 - 1.2 mg/dL 0.3  0.3  0.4   Alkaline Phos 38 - 126 U/L 79  74  61   AST 15 - 41 U/L 33  39  31   ALT 0 - 44 U/L 27  28  24      ASSESSMENT & PLAN Darrell Russo is a 48 y.o. male who presents for a follow up for continued management of CML. He will transfer his care from Dr. Amadeo to Dr. Federico.   #CML, chronic phase: --Diagnosed in 2007 and started on Gleevac 400 mg in 2007 --Labs today show WBC 5.0, Hgb 12.8, MCV 97.6, Plt 110. Creatinine and LFTs normal. --Most recent BCR/ABL PCR levels from 11/07/2022 showed undetectable levels. Today's levels are pending --patient currently taking  Gleevec  400 mg PO daily.  --Discussed role for discontinuing Gleevac therapy since patient has been on therapy for >3 years. Patient and his mother would prefer to continue on medication.  --Return in 3 months for labs/follow up   No orders of the defined types were placed in this encounter.   All questions were answered. The patient knows to call the clinic with any problems, questions or concerns.  I have spent a total of 30 minutes minutes of face-to-face and non-face-to-face time, preparing to see the patient,performing a medically appropriate examination, counseling and educating the patient, ordering tests/procedures,  documenting clinical information in the electronic health record, independently interpreting results and communicating  results to the patient, and care coordination.   Norleen IVAR Kidney, MD Department of Hematology/Oncology [facility] at [facility] Phone: [phone] Pager: [phone] Email: [email or web address]

## 2023-11-05 ENCOUNTER — Inpatient Hospital Stay: Attending: Physician Assistant

## 2023-11-05 ENCOUNTER — Inpatient Hospital Stay (HOSPITAL_BASED_OUTPATIENT_CLINIC_OR_DEPARTMENT_OTHER): Admitting: Hematology and Oncology

## 2023-11-05 VITALS — BP 128/72 | HR 65 | Temp 97.3°F | Resp 17 | Ht 73.0 in | Wt 246.0 lb

## 2023-11-05 DIAGNOSIS — Z604 Social exclusion and rejection: Secondary | ICD-10-CM | POA: Insufficient documentation

## 2023-11-05 DIAGNOSIS — F840 Autistic disorder: Secondary | ICD-10-CM | POA: Insufficient documentation

## 2023-11-05 DIAGNOSIS — C9210 Chronic myeloid leukemia, BCR/ABL-positive, not having achieved remission: Secondary | ICD-10-CM

## 2023-11-05 DIAGNOSIS — Z79899 Other long term (current) drug therapy: Secondary | ICD-10-CM | POA: Diagnosis not present

## 2023-11-05 LAB — CMP (CANCER CENTER ONLY)
ALT: 27 U/L (ref 0–44)
AST: 33 U/L (ref 15–41)
Albumin: 4.3 g/dL (ref 3.5–5.0)
Alkaline Phosphatase: 79 U/L (ref 38–126)
Anion gap: 5 (ref 5–15)
BUN: 23 mg/dL — ABNORMAL HIGH (ref 6–20)
CO2: 27 mmol/L (ref 22–32)
Calcium: 9.5 mg/dL (ref 8.9–10.3)
Chloride: 110 mmol/L (ref 98–111)
Creatinine: 1.16 mg/dL (ref 0.61–1.24)
GFR, Estimated: >60 mL/min (ref >60)
Glucose, Bld: 62 mg/dL — ABNORMAL LOW (ref 70–99)
Potassium: 3.9 mmol/L (ref 3.5–5.1)
Sodium: 142 mmol/L (ref 135–145)
Total Bilirubin: 0.3 mg/dL (ref 0.0–1.2)
Total Protein: 7.2 g/dL (ref 6.5–8.1)

## 2023-11-05 LAB — CBC WITH DIFFERENTIAL (CANCER CENTER ONLY)
Abs Immature Granulocytes: 0.01 K/uL (ref 0.00–0.07)
Basophils Absolute: 0 K/uL (ref 0.0–0.1)
Basophils Relative: 1 %
Eosinophils Absolute: 0.3 K/uL (ref 0.0–0.5)
Eosinophils Relative: 5 %
HCT: 37 % — ABNORMAL LOW (ref 39.0–52.0)
Hemoglobin: 12.8 g/dL — ABNORMAL LOW (ref 13.0–17.0)
Immature Granulocytes: 0 %
Lymphocytes Relative: 41 %
Lymphs Abs: 2 K/uL (ref 0.7–4.0)
MCH: 33.8 pg (ref 26.0–34.0)
MCHC: 34.6 g/dL (ref 30.0–36.0)
MCV: 97.6 fL (ref 80.0–100.0)
Monocytes Absolute: 0.6 K/uL (ref 0.1–1.0)
Monocytes Relative: 11 %
Neutro Abs: 2.1 K/uL (ref 1.7–7.7)
Neutrophils Relative %: 42 %
Platelet Count: 110 K/uL — ABNORMAL LOW (ref 150–400)
RBC: 3.79 MIL/uL — ABNORMAL LOW (ref 4.22–5.81)
RDW: 15.2 % (ref 11.5–15.5)
WBC Count: 5 K/uL (ref 4.0–10.5)
nRBC: 0 % (ref 0.0–0.2)

## 2023-11-05 LAB — LACTATE DEHYDROGENASE: LDH: 233 U/L — ABNORMAL HIGH (ref 98–192)

## 2023-11-05 MED ORDER — IMATINIB MESYLATE 100 MG PO TABS: 200.0000 mg | ORAL_TABLET | Freq: Two times a day (BID) | ORAL | 3 refills | Status: AC

## 2023-11-09 LAB — BCR-ABL1, CML/ALL, PCR, QUANT
E1A2 Transcript: <0.0032 %
Interpretation (BCRAL):: NEGATIVE
b2a2 transcript: <0.0032 %
b3a2 transcript: <0.0032 %

## 2023-11-15 ENCOUNTER — Ambulatory Visit: Payer: Self-pay | Admitting: *Deleted

## 2023-11-15 NOTE — Telephone Encounter (Signed)
 TCT patient's mother regarding recent lab results. Spoke with her. Advised that that his CML levels remain undetectable. We will plan to see him back as scheduled for labs in 3 months and clinic visit in 6 months She voiced understanding.

## 2023-11-15 NOTE — Telephone Encounter (Signed)
-----   Message from Norleen ONEIDA Kidney IV sent at 11/10/2023  4:31 PM EDT ----- Please let Darrell Russo know (via his sister) that his CML levels remain undetectable.  We will plan to see him back as scheduled for labs in 3 months and clinic visit in 6 months ----- Message ----- From: Interface, Lab In [place] Sent: 11/05/2023  10:10 AM EDT To: Norleen ONEIDA Kidney [place], MD

## 2024-02-06 ENCOUNTER — Inpatient Hospital Stay

## 2024-02-06 ENCOUNTER — Inpatient Hospital Stay: Admitting: Hematology and Oncology

## 2024-02-15 ENCOUNTER — Inpatient Hospital Stay: Attending: Physician Assistant

## 2024-02-15 ENCOUNTER — Other Ambulatory Visit: Payer: Self-pay | Admitting: Hematology and Oncology

## 2024-02-15 ENCOUNTER — Inpatient Hospital Stay: Admitting: Hematology and Oncology

## 2024-02-15 VITALS — BP 136/80 | HR 68 | Temp 97.8°F | Resp 13 | Wt 235.6 lb

## 2024-02-15 DIAGNOSIS — C9210 Chronic myeloid leukemia, BCR/ABL-positive, not having achieved remission: Secondary | ICD-10-CM

## 2024-02-15 DIAGNOSIS — Z79899 Other long term (current) drug therapy: Secondary | ICD-10-CM | POA: Diagnosis not present

## 2024-02-15 DIAGNOSIS — F840 Autistic disorder: Secondary | ICD-10-CM | POA: Diagnosis not present

## 2024-02-15 DIAGNOSIS — R140 Abdominal distension (gaseous): Secondary | ICD-10-CM | POA: Insufficient documentation

## 2024-02-15 DIAGNOSIS — Z604 Social exclusion and rejection: Secondary | ICD-10-CM | POA: Insufficient documentation

## 2024-02-15 LAB — CMP (CANCER CENTER ONLY)
ALT: 40 U/L (ref 0–44)
AST: 47 U/L — ABNORMAL HIGH (ref 15–41)
Albumin: 4.4 g/dL (ref 3.5–5.0)
Alkaline Phosphatase: 72 U/L (ref 38–126)
Anion gap: 9 (ref 5–15)
BUN: 21 mg/dL — ABNORMAL HIGH (ref 6–20)
CO2: 25 mmol/L (ref 22–32)
Calcium: 9.6 mg/dL (ref 8.9–10.3)
Chloride: 106 mmol/L (ref 98–111)
Creatinine: 1.36 mg/dL — ABNORMAL HIGH (ref 0.61–1.24)
GFR, Estimated: >60 mL/min
Glucose, Bld: 100 mg/dL — ABNORMAL HIGH (ref 70–99)
Potassium: 4.3 mmol/L (ref 3.5–5.1)
Sodium: 141 mmol/L (ref 135–145)
Total Bilirubin: 0.3 mg/dL (ref 0.0–1.2)
Total Protein: 7.2 g/dL (ref 6.5–8.1)

## 2024-02-15 LAB — CBC WITH DIFFERENTIAL (CANCER CENTER ONLY)
Abs Immature Granulocytes: 0 K/uL (ref 0.00–0.07)
Basophils Absolute: 0 K/uL (ref 0.0–0.1)
Basophils Relative: 1 %
Eosinophils Absolute: 0.1 K/uL (ref 0.0–0.5)
Eosinophils Relative: 2 %
HCT: 37.2 % — ABNORMAL LOW (ref 39.0–52.0)
Hemoglobin: 12.8 g/dL — ABNORMAL LOW (ref 13.0–17.0)
Immature Granulocytes: 0 %
Lymphocytes Relative: 41 %
Lymphs Abs: 1.7 K/uL (ref 0.7–4.0)
MCH: 33.3 pg (ref 26.0–34.0)
MCHC: 34.4 g/dL (ref 30.0–36.0)
MCV: 96.9 fL (ref 80.0–100.0)
Monocytes Absolute: 0.6 K/uL (ref 0.1–1.0)
Monocytes Relative: 14 %
Neutro Abs: 1.7 K/uL (ref 1.7–7.7)
Neutrophils Relative %: 42 %
Platelet Count: 115 K/uL — ABNORMAL LOW (ref 150–400)
RBC: 3.84 MIL/uL — ABNORMAL LOW (ref 4.22–5.81)
RDW: 14.5 % (ref 11.5–15.5)
WBC Count: 4.2 K/uL (ref 4.0–10.5)
nRBC: 0 % (ref 0.0–0.2)

## 2024-02-15 LAB — LACTATE DEHYDROGENASE: LDH: 254 U/L — ABNORMAL HIGH (ref 105–235)

## 2024-02-15 NOTE — Progress Notes (Signed)
 " [facility] Telephone:([phone]   Fax:([phone]  PROGRESS NOTE  Patient Care Team: Lenon Boyer, [name] as PCP - General (Nurse Practitioner)  CHIEF COMPLAINTS/PURPOSE OF CONSULTATION:  Chronic Myeloid Leukemia, chronic phase  CURRENT TREATMENT: Gleevec  400 mg PO daily   HISTORY OF PRESENTING ILLNESS:  Darrell Russo 48 y.o. 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 He was last seen on 11/05/2023.  Today he is accompanied by his mother.  On exam today, Mr. Gatling reports he is been well overall in interim since our last visit.  He said no issues with fevers, chills, sweats, nausea, vomiting or diarrhea.  He has had no viral illnesses.  His energy levels are good and his appetite is strong.  He is in no trouble with bleeding, bruising, or dark stools.  He is taking his Gleevec  as prescribed with no missed dosages.  He reports that he has been having some issues with gassiness.  He is having no trouble with headache, vision changes, or chest pain.  He does his best to drink plenty of water.  He reports he has been more active lately and has lost a good bit of weight, down 11 pounds from 11/05/2023.  His mother thinks he has increased his activity here recently.  Additionally he may not be eating as much.  Otherwise he is at his baseline level of health and willing and able to continue on Gleevec  therapy at this time.  MEDICAL HISTORY:  Past Medical History:  Diagnosis Date   Autism    Folliculitis    Hypertension    Impulse control disorder    Leukemia (HCC)    TMJ (dislocation of temporomandibular joint)     SURGICAL HISTORY: Past Surgical History:  Procedure Laterality Date   ANKLE SURGERY     l/ankle fracture 5 years ago   ORIF HIP FRACTURE Right 12/13/2016   Procedure: ORIF of Right Femoral neck fracture;  Surgeon: Kendal Franky SQUIBB, MD;  Location: [facility] OR;  Service: Orthopedics;  Laterality: Right;     SOCIAL HISTORY: Social History   Socioeconomic History   Marital status: Single    Spouse name: Not on file   Number of children: Not on file   Years of education: Not on file   Highest education level: Not on file  Occupational History   Not on file  Tobacco Use   Smoking status: Never   Smokeless tobacco: Never  Substance and Sexual Activity   Alcohol use: No   Drug use: No   Sexual activity: Not on file  Other Topics Concern   Not on file  Social History Narrative   Rare coffee or soda   Social Drivers of Health   Tobacco Use: Low Risk (10/22/2023)   Patient History    Smoking Tobacco Use: Never    Smokeless Tobacco Use: Never    Passive Exposure: Not on file  Financial Resource Strain: Low Risk (02/08/2023)   Received from [facility]   Overall Financial Resource Strain (CARDIA)    Difficulty of Paying Living Expenses: Not very hard  Food Insecurity: No Food Insecurity (02/08/2023)   Received from [facility]   Epic    Within the past 12 months, you worried that your food would run out before you got the money to buy more.: Never true    Within the past 12 months, the food you bought just didn't last and you didn't have money  to get more.: Never true  Transportation Needs: No Transportation Needs (02/08/2023)   Received from [facility]   PRAPARE - Transportation    Lack of Transportation (Medical): No    Lack of Transportation (Non-Medical): No  Physical Activity: Unknown (02/08/2023)   Received from [facility]   Exercise Vital Sign    On average, how many days per week do you engage in moderate to strenuous exercise (like a brisk walk)?: 0 days    Minutes of Exercise per Session: Not on file  Stress: No Stress Concern Present (02/08/2023)   Received from [facility] - Occupational Stress Questionnaire    Feeling of Stress : Not at all  Social Connections: Somewhat Isolated (02/08/2023)   Received  from [facility]   Social Network    How would you rate your social network (family, work, friends)?: Restricted participation with some degree of social isolation  Intimate Partner Violence: Not At Risk (02/08/2023)   Received from [facility]   HITS    Over the last 12 months how often did your partner physically hurt you?: Never    Over the last 12 months how often did your partner insult you or talk down to you?: Never    Over the last 12 months how often did your partner threaten you with physical harm?: Never    Over the last 12 months how often did your partner scream or curse at you?: Never  Depression (PHQ2-9): Not on file  Alcohol Screen: Not on file  Housing: Low Risk (02/08/2023)   Received from [facility]    In the last 12 months, was there a time when you were not able to pay the mortgage or rent on time?: No    In the past 12 months, how many times have you moved where you were living?: 1    At any time in the past 12 months, were you homeless or living in a shelter (including now)?: No  Utilities: Not At Risk (02/08/2023)   Received from [facility] Utilities    Threatened with loss of utilities: No  Health Literacy: Not on file    FAMILY HISTORY: No family history on file.  ALLERGIES:  has no known allergies.  MEDICATIONS:  Current Outpatient Medications  Medication Sig Dispense Refill   divalproex  (DEPAKOTE  SPRINKLE) 125 MG capsule Take 1 capsule (125 mg total) by mouth every 12 (twelve) hours. 180 capsule 3   divalproex  (DEPAKOTE ) 500 MG DR tablet Take 1 tablet (500 mg total) by mouth every 12 (twelve) hours. 180 tablet 3   furosemide  (LASIX ) 20 MG tablet Take 20 mg by mouth daily.     GAVILAX 17 GM/SCOOP powder Take by mouth.     haloperidol  (HALDOL ) 0.5 MG tablet Take 1.5 mg by mouth at bedtime.     hydrOXYzine (ATARAX) 25 MG tablet Take 25 mg by mouth 3 (three) times daily. (Patient taking differently: Take 25 mg by mouth 3 (three)  times daily as needed.)     imatinib  (GLEEVEC ) 100 MG tablet Take 2 tablets (200 mg total) by mouth 2 (two) times daily. Take with meals and large glass of water.Caution:Chemotherapy 120 tablet 3   levothyroxine  (SYNTHROID ) 200 MCG tablet Take 200 mcg by mouth daily.     meloxicam (MOBIC) 15 MG tablet Take 15 mg by mouth daily.     metoprolol  (TOPROL -XL) 50 MG 24 hr tablet Take 50 mg  by mouth daily.       miconazole (MICOTIN) 2 % powder Apply 1 Application topically 2 (two) times daily.     minocycline  (MINOCIN ,DYNACIN ) 100 MG capsule Take 100 mg by mouth 2 (two) times daily.       nystatin cream (MYCOSTATIN) Apply 1 application topically at bedtime.     senna (SENOKOT) 8.6 MG TABS tablet Take 1 tablet by mouth daily as needed for mild constipation.     vitamin D3 (CHOLECALCIFEROL) 25 MCG tablet Take 1,000 Units by mouth daily.     zonisamide  (ZONEGRAN ) 100 MG capsule Take 3 capsules (300 mg total) by mouth at bedtime.     No current facility-administered medications for this visit.    REVIEW OF SYSTEMS:   Constitutional: ( - ) fevers, ( - )  chills , ( - ) night sweats Eyes: ( - ) blurriness of vision, ( - ) double vision, ( - ) watery eyes Ears, nose, mouth, throat, and face: ( - ) mucositis, ( - ) sore throat Respiratory: ( - ) cough, ( - ) dyspnea, ( - ) wheezes Cardiovascular: ( - ) palpitation, ( - ) chest discomfort, ( - ) lower extremity swelling Gastrointestinal:  ( - ) nausea, ( - ) heartburn, ( - ) change in bowel habits Skin: ( - ) abnormal skin rashes Lymphatics: ( - ) new lymphadenopathy, ( - ) easy bruising Neurological: ( - ) numbness, ( - ) tingling, ( - ) new weaknesses Behavioral/Psych: ( - ) mood change, ( - ) new changes  All other systems were reviewed with the patient and are negative.  PHYSICAL EXAMINATION: ECOG PERFORMANCE STATUS: 0 - Asymptomatic  Vitals:   02/15/24 1028  BP: 136/80  Pulse: 68  Resp: 13  Temp: 97.8 F (36.6 C)  SpO2: 100%     Filed  Weights   02/15/24 1028  Weight: 235 lb 9.6 oz (106.9 kg)       GENERAL: well appearing male in NAD  SKIN: skin color, texture, turgor are normal, no rashes or significant lesions EYES: conjunctiva are pink and non-injected, sclera clear OROPHARYNX: no exudate, no erythema; lips, buccal mucosa, and tongue normal  LUNGS: clear to auscultation and percussion with normal breathing effort HEART: regular rate & rhythm and no murmurs and no lower extremity edema Musculoskeletal: no cyanosis of digits and no clubbing  PSYCH: alert & oriented x 3, fluent speech NEURO: no focal motor/sensory deficits  LABORATORY DATA:  I have reviewed the data as listed    Latest Ref Rng & Units 02/15/2024    9:55 AM 11/05/2023    9:48 AM 08/06/2023   10:11 AM  CBC  WBC 4.0 - 10.5 K/uL 4.2  5.0  4.9   Hemoglobin 13.0 - 17.0 g/dL 87.1  87.1  88.1   Hematocrit 39.0 - 52.0 % 37.2  37.0  34.4   Platelets 150 - 400 K/uL 115  110  100        Latest Ref Rng & Units 02/15/2024    9:55 AM 11/05/2023    9:48 AM 10/22/2023   11:30 AM  CMP  Glucose 70 - 99 mg/dL 899  62    BUN 6 - 20 mg/dL 21  23    Creatinine 9.38 - 1.24 mg/dL 8.63  8.83    Sodium 864 - 145 mmol/L 141  142    Potassium 3.5 - 5.1 mmol/L 4.3  3.9    Chloride 98 - 111 mmol/L 106  110    CO2 22 - 32 mmol/L 25  27    Calcium 8.9 - 10.3 mg/dL 9.6  9.5    Total Protein 6.5 - 8.1 g/dL 7.2  7.2  6.9   Total Bilirubin 0.0 - 1.2 mg/dL 0.3  0.3  0.3   Alkaline Phos 38 - 126 U/L 72  79  74   AST 15 - 41 U/L 47  33  39   ALT 0 - 44 U/L 40  27  28     ASSESSMENT & PLAN DECKLIN WEDDINGTON is a 47 y.o. male who presents for a follow up for continued management of CML. He will transfer his care from Dr. Amadeo to Dr. Federico.   #CML, chronic phase: --Diagnosed in 2007 and started on Gleevac 400 mg in 2007 --Labs today show WBC 4.2, Hgb 12.8, MCV 96.9, Plt 115. Creatinine and LFTs normal. --Most recent BCR/ABL PCR levels from 11/07/2022 showed undetectable  levels. Today's levels are pending --patient currently taking Gleevec  400 mg PO daily.  --Discussed role for discontinuing Gleevac therapy since patient has been on therapy for >3 years. Patient and his mother would prefer to continue on medication.  --Return in 3 months for labs/follow up   No orders of the defined types were placed in this encounter.   All questions were answered. The patient knows to call the clinic with any problems, questions or concerns.  I have spent a total of 30 minutes minutes of face-to-face and non-face-to-face time, preparing to see the patient,performing a medically appropriate examination, counseling and educating the patient, ordering tests/procedures,  documenting clinical information in the electronic health record, independently interpreting results and communicating results to the patient, and care coordination.   Norleen IVAR Federico, MD Department of Hematology/Oncology [facility] at [facility] Phone: [phone] Pager: [phone] Email: [email or web address]  "

## 2024-02-24 LAB — BCR-ABL1, CML/ALL, PCR, QUANT
E1A2 Transcript: <0.0032 %
Interpretation (BCRAL):: NEGATIVE
b2a2 transcript: <0.0032 %
b3a2 transcript: <0.0032 %

## 2024-02-26 ENCOUNTER — Ambulatory Visit: Payer: Self-pay

## 2024-02-26 NOTE — Telephone Encounter (Signed)
-----   Message from Norleen Kidney, MD sent at 02/26/2024  9:08 AM EST ----- Please let Darrell Russo know (via his mother or sister) that his CML levels remain undetectable. He is doing great, we will continue on his current medication and see him back as scheduled in 3 months.

## 2024-02-26 NOTE — Telephone Encounter (Signed)
 TC to Darrell Russo and was connected to her identified voice mail message. Left the following information per Dr Federico, Please let [name] know (via his mother or sister) that his CML levels remain undetectable. He is doing great, we will continue on his current medication and see him back as scheduled in 3 months.  Also left reminder of day and time of his next scheduled appointment.

## 2024-03-13 ENCOUNTER — Telehealth: Payer: Self-pay

## 2024-03-13 NOTE — Telephone Encounter (Signed)
"   Returned call to pt's mom, Doyal Glatter, as she requested results.  Reviewed  the following information per Dr Federico, Please let Mr. Feinstein know (via his mother or sister) that his CML levels remain undetectable. He is doing great, we will continue on his current medication and see him back as scheduled in 3 months.   Also reviewed day and time of his next scheduled appointments as Ms Glatter brings pt to appointments.  Ms Glatter voiced understanding of information.  "

## 2024-05-07 ENCOUNTER — Inpatient Hospital Stay: Admitting: Hematology and Oncology

## 2024-05-07 ENCOUNTER — Inpatient Hospital Stay: Attending: Physician Assistant

## 2024-10-21 ENCOUNTER — Ambulatory Visit: Admitting: Family Medicine

## 2024-10-22 ENCOUNTER — Ambulatory Visit: Admitting: Family Medicine
